# Patient Record
Sex: Female | Born: 1965 | Race: White | Hispanic: No | Marital: Married | Smoking: Never smoker
Health system: Southern US, Community
[De-identification: ages and names within clinical notes are randomized; demographics above are authoritative.]

## PROBLEM LIST (undated history)

## (undated) DIAGNOSIS — Z974 Presence of external hearing-aid: Secondary | ICD-10-CM

## (undated) DIAGNOSIS — I10 Essential (primary) hypertension: Secondary | ICD-10-CM

## (undated) DIAGNOSIS — R011 Cardiac murmur, unspecified: Secondary | ICD-10-CM

## (undated) DIAGNOSIS — Z8041 Family history of malignant neoplasm of ovary: Secondary | ICD-10-CM

## (undated) DIAGNOSIS — Z803 Family history of malignant neoplasm of breast: Secondary | ICD-10-CM

## (undated) DIAGNOSIS — M2040 Other hammer toe(s) (acquired), unspecified foot: Secondary | ICD-10-CM

## (undated) DIAGNOSIS — Z808 Family history of malignant neoplasm of other organs or systems: Secondary | ICD-10-CM

## (undated) DIAGNOSIS — Z801 Family history of malignant neoplasm of trachea, bronchus and lung: Secondary | ICD-10-CM

## (undated) DIAGNOSIS — Z8669 Personal history of other diseases of the nervous system and sense organs: Secondary | ICD-10-CM

## (undated) DIAGNOSIS — K589 Irritable bowel syndrome without diarrhea: Secondary | ICD-10-CM

## (undated) DIAGNOSIS — G4730 Sleep apnea, unspecified: Secondary | ICD-10-CM

## (undated) DIAGNOSIS — C801 Malignant (primary) neoplasm, unspecified: Secondary | ICD-10-CM

## (undated) DIAGNOSIS — L309 Dermatitis, unspecified: Secondary | ICD-10-CM

## (undated) DIAGNOSIS — Z923 Personal history of irradiation: Secondary | ICD-10-CM

## (undated) DIAGNOSIS — M2010 Hallux valgus (acquired), unspecified foot: Secondary | ICD-10-CM

## (undated) HISTORY — DX: Family history of malignant neoplasm of ovary: Z80.41

## (undated) HISTORY — DX: Family history of malignant neoplasm of other organs or systems: Z80.8

## (undated) HISTORY — PX: TONSILLECTOMY: SHX5217

## (undated) HISTORY — DX: Family history of malignant neoplasm of breast: Z80.3

## (undated) HISTORY — DX: Family history of malignant neoplasm of trachea, bronchus and lung: Z80.1

---

## 1997-05-09 ENCOUNTER — Inpatient Hospital Stay (HOSPITAL_COMMUNITY): Admission: AD | Admit: 1997-05-09 | Discharge: 1997-05-09 | Payer: Self-pay | Admitting: Obstetrics and Gynecology

## 1998-01-17 ENCOUNTER — Inpatient Hospital Stay (HOSPITAL_COMMUNITY): Admission: AD | Admit: 1998-01-17 | Discharge: 1998-01-19 | Payer: Self-pay | Admitting: Obstetrics and Gynecology

## 1998-01-19 ENCOUNTER — Encounter (HOSPITAL_COMMUNITY): Admission: RE | Admit: 1998-01-19 | Discharge: 1998-04-19 | Payer: Self-pay | Admitting: Obstetrics and Gynecology

## 1999-02-10 ENCOUNTER — Other Ambulatory Visit: Admission: RE | Admit: 1999-02-10 | Discharge: 1999-02-10 | Payer: Self-pay | Admitting: Obstetrics and Gynecology

## 2000-01-01 ENCOUNTER — Inpatient Hospital Stay (HOSPITAL_COMMUNITY): Admission: AD | Admit: 2000-01-01 | Discharge: 2000-01-04 | Payer: Self-pay | Admitting: Obstetrics and Gynecology

## 2000-01-01 ENCOUNTER — Encounter (INDEPENDENT_AMBULATORY_CARE_PROVIDER_SITE_OTHER): Payer: Self-pay

## 2000-01-15 ENCOUNTER — Encounter: Admission: RE | Admit: 2000-01-15 | Discharge: 2000-01-28 | Payer: Self-pay | Admitting: Obstetrics and Gynecology

## 2000-02-09 ENCOUNTER — Other Ambulatory Visit: Admission: RE | Admit: 2000-02-09 | Discharge: 2000-02-09 | Payer: Self-pay | Admitting: Obstetrics and Gynecology

## 2000-05-30 ENCOUNTER — Emergency Department (HOSPITAL_COMMUNITY): Admission: EM | Admit: 2000-05-30 | Discharge: 2000-05-30 | Payer: Self-pay | Admitting: Emergency Medicine

## 2000-06-25 ENCOUNTER — Encounter (INDEPENDENT_AMBULATORY_CARE_PROVIDER_SITE_OTHER): Payer: Self-pay | Admitting: *Deleted

## 2000-06-25 ENCOUNTER — Ambulatory Visit (HOSPITAL_BASED_OUTPATIENT_CLINIC_OR_DEPARTMENT_OTHER): Admission: RE | Admit: 2000-06-25 | Discharge: 2000-06-25 | Payer: Self-pay | Admitting: Otolaryngology

## 2000-06-25 HISTORY — PX: EXPLORATORY TYMPANOTOMY: SHX1545

## 2000-06-25 HISTORY — PX: TYMPANOPLASTY: SHX33

## 2001-02-08 ENCOUNTER — Other Ambulatory Visit: Admission: RE | Admit: 2001-02-08 | Discharge: 2001-02-08 | Payer: Self-pay | Admitting: Obstetrics and Gynecology

## 2002-03-16 ENCOUNTER — Other Ambulatory Visit: Admission: RE | Admit: 2002-03-16 | Discharge: 2002-03-16 | Payer: Self-pay | Admitting: Obstetrics and Gynecology

## 2003-03-16 ENCOUNTER — Other Ambulatory Visit: Admission: RE | Admit: 2003-03-16 | Discharge: 2003-03-16 | Payer: Self-pay | Admitting: Obstetrics and Gynecology

## 2004-04-16 ENCOUNTER — Other Ambulatory Visit: Admission: RE | Admit: 2004-04-16 | Discharge: 2004-04-16 | Payer: Self-pay | Admitting: Obstetrics and Gynecology

## 2004-09-17 ENCOUNTER — Other Ambulatory Visit: Admission: RE | Admit: 2004-09-17 | Discharge: 2004-09-17 | Payer: Self-pay | Admitting: Obstetrics and Gynecology

## 2006-01-15 ENCOUNTER — Emergency Department (HOSPITAL_COMMUNITY): Admission: EM | Admit: 2006-01-15 | Discharge: 2006-01-15 | Payer: Self-pay | Admitting: Emergency Medicine

## 2006-06-15 ENCOUNTER — Emergency Department (HOSPITAL_COMMUNITY): Admission: EM | Admit: 2006-06-15 | Discharge: 2006-06-15 | Payer: Self-pay | Admitting: Emergency Medicine

## 2007-06-22 ENCOUNTER — Encounter: Payer: Self-pay | Admitting: Family Medicine

## 2007-07-14 ENCOUNTER — Ambulatory Visit: Payer: Self-pay | Admitting: Family Medicine

## 2007-07-14 DIAGNOSIS — H6690 Otitis media, unspecified, unspecified ear: Secondary | ICD-10-CM | POA: Insufficient documentation

## 2007-07-14 DIAGNOSIS — E049 Nontoxic goiter, unspecified: Secondary | ICD-10-CM | POA: Insufficient documentation

## 2007-07-18 ENCOUNTER — Telehealth: Payer: Self-pay | Admitting: Family Medicine

## 2007-07-27 ENCOUNTER — Encounter: Payer: Self-pay | Admitting: Family Medicine

## 2007-08-04 LAB — CONVERTED CEMR LAB
AST: 17 units/L (ref 0–37)
Alkaline Phosphatase: 68 units/L (ref 39–117)
BUN: 16 mg/dL (ref 6–23)
Creatinine, Ser: 0.95 mg/dL (ref 0.40–1.20)
Glucose, Bld: 103 mg/dL — ABNORMAL HIGH (ref 70–99)
HDL: 50 mg/dL (ref >39)
LDL Cholesterol: 85 mg/dL (ref 0–99)
Total Bilirubin: 0.6 mg/dL (ref 0.3–1.2)
Total CHOL/HDL Ratio: 3.1
Triglycerides: 91 mg/dL (ref <150)
VLDL: 18 mg/dL (ref 0–40)

## 2007-08-05 ENCOUNTER — Telehealth: Payer: Self-pay | Admitting: Family Medicine

## 2008-09-03 ENCOUNTER — Ambulatory Visit: Payer: Self-pay | Admitting: Occupational Medicine

## 2008-09-03 DIAGNOSIS — I10 Essential (primary) hypertension: Secondary | ICD-10-CM | POA: Insufficient documentation

## 2008-09-19 ENCOUNTER — Ambulatory Visit: Payer: Self-pay | Admitting: Family Medicine

## 2008-09-21 ENCOUNTER — Encounter: Payer: Self-pay | Admitting: Family Medicine

## 2008-09-23 LAB — CONVERTED CEMR LAB
AST: 22 units/L (ref 0–37)
Alkaline Phosphatase: 71 units/L (ref 39–117)
BUN: 17 mg/dL (ref 6–23)
Calcium: 8.6 mg/dL (ref 8.4–10.5)
Chloride: 106 meq/L (ref 96–112)
Creatinine, Ser: 0.98 mg/dL (ref 0.40–1.20)
HDL: 54 mg/dL (ref >39)
Total Bilirubin: 0.5 mg/dL (ref 0.3–1.2)
Total CHOL/HDL Ratio: 3.1
VLDL: 13 mg/dL (ref 0–40)

## 2009-07-31 ENCOUNTER — Encounter: Payer: Self-pay | Admitting: Family Medicine

## 2009-10-10 ENCOUNTER — Ambulatory Visit (HOSPITAL_BASED_OUTPATIENT_CLINIC_OR_DEPARTMENT_OTHER): Admission: RE | Admit: 2009-10-10 | Discharge: 2009-10-10 | Payer: Self-pay | Admitting: Otolaryngology

## 2009-10-10 HISTORY — PX: TYMPANOSTOMY TUBE PLACEMENT: SHX32

## 2009-12-09 ENCOUNTER — Ambulatory Visit: Payer: Self-pay | Admitting: Emergency Medicine

## 2009-12-09 DIAGNOSIS — H6980 Other specified disorders of Eustachian tube, unspecified ear: Secondary | ICD-10-CM

## 2010-04-08 NOTE — Consult Note (Signed)
Summary: [facility]  [facility]   Imported By: Lanelle Bal 08/12/2009 09:08:34  _____________________________________________________________________  External Attachment:    Type:   Image     Comment:   External Document

## 2010-04-08 NOTE — Assessment & Plan Note (Signed)
Summary: RUNNY NOSE,SINUS PROBLEMS,COUGH,CONGESTION/TJ   Vital Signs:  Patient Profile:   45 Years Old Female CC:      congestion, cough, runny nose Height:     61 inches Weight:      133 pounds O2 Sat:      99 % O2 treatment:    Room Air Temp:     97.8 degrees F oral Pulse rate:   99 / minute Resp:     16 per minute BP sitting:   125 / 80  (left arm) Cuff size:   regular  Vitals Entered By: Lajean Saver RN (December 09, 2009 6:17 PM)                  Updated Prior Medication List: LISINOPRIL-HYDROCHLOROTHIAZIDE 20-12.5 MG TABS (LISINOPRIL-HYDROCHLOROTHIAZIDE) Take 1 tablet by mouth once a day FLUTICASONE PROPIONATE 50 MCG/ACT  SUSP (FLUTICASONE PROPIONATE) 2 sprays in each nostril daily JUNEL FE 1/20 1-20 MG-MCG TABS (NORETHIN ACE-ETH ESTRAD-FE) once daily  Current Allergies (reviewed today): ! ERYTHROMYCIN ! MACROBIDHistory of Present Illness Chief Complaint: congestion, cough, runny nose History of Present Illness: Patient complains of onset of cold symptoms for 14+ days.  They have been using OTC Afrin and cold & cold meds which is helping a little bit.  Everybody in the house was sick but getting better. 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 No sore throat + cough No pleuritic pain No wheezing + nasal congestion + post-nasal drainage + sinus pain/pressure No itchy/red eyes No earache No hemoptysis No SOB No chills/sweats No fever No nausea No vomiting No abdominal pain No diarrhea No skin rashes No fatigue No myalgias No headache   REVIEW OF SYSTEMS Constitutional Symptoms       Complains of fatigue.     Denies fever, chills, night sweats, weight loss, and weight gain.  Eyes       Complains of contact lenses.      Denies change in vision, eye pain, eye discharge, glasses, and eye surgery. Ear/Nose/Throat/Mouth       Complains of ear pain, frequent runny nose, and sinus problems.      Denies hearing loss/aids, change in hearing, ear discharge, dizziness,  frequent nose bleeds, sore throat, hoarseness, and tooth pain or bleeding.  Respiratory       Complains of productive cough.      Denies dry cough, wheezing, shortness of breath, asthma, bronchitis, and emphysema/COPD.  Cardiovascular       Denies murmurs, chest pain, and tires easily with exhertion.    Gastrointestinal       Denies stomach pain, nausea/vomiting, diarrhea, constipation, blood in bowel movements, and indigestion. Genitourniary       Denies painful urination, kidney stones, and loss of urinary control. Neurological       Denies paralysis, seizures, and fainting/blackouts. Musculoskeletal       Denies muscle pain, joint pain, joint stiffness, decreased range of motion, redness, swelling, muscle weakness, and gout.  Skin       Denies bruising, unusual mles/lumps or sores, and hair/skin or nail changes.  Psych       Denies mood changes, temper/anger issues, anxiety/stress, speech problems, depression, and sleep problems.  Past History:  Past Medical History: Reviewed history from 09/03/2008 and no changes required. Hypertension  Past Surgical History: Skin graft over left TM 2002 Surgical removal of wisdom teet in 1991 surgery on left ear 2002 tubes in ears 1995 adenoids removed 1995 Ear tubes 10/2009  Family History: Reviewed history  from 09/03/2008 and no changes required. Ovarian cancer Paternal aunt Father with MI in 63 Brother with Cholesterol Father, Mother with HTN mother - alzheimers  Social History: Reviewed history from 07/14/2007 and no changes required. 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  2 yrs of college.  Married to [employer] with 2 kids.   Never Smoked Alcohol use-yes Drug use-no Regular exercise-no Physical Exam General appearance: well developed, well nourished, no acute distress Ears: bilateral tubes present, L TM slight erythema Nasal: mucosa pink, nonedematous, no septal deviation, turbinates normal Oral/Pharynx: slight erythema, slight  tonsil enlargement, no exudates, dental braces present Chest/Lungs: no rales, wheezes, or rhonchi bilateral, breath sounds equal without effort Heart: regular rate and  rhythm, no murmur Skin: no obvious rashes or lesions MSE: oriented to time, place, and person Assessment New Problems: EUSTACHIAN TUBE DYSFUNCTION, 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  Plan New Medications/Changes: AMOXICILLIN 875 MG TABS (AMOXICILLIN) 1 tab by mouth two times a day for 10 days  #20 x 0, 12/09/2009, Hoyt Koch MD  New Orders: Est. Patient Level II [phone] Planning Comments:   Sudafed Hand hygeine and need to disinfect the house (lysol handles, clean bathrooms, change sheets) to prevent further spread Follow-up with your primary care physician if not improving or if getting worse   The patient and/or caregiver has been counseled thoroughly with regard to medications prescribed including dosage, schedule, interactions, rationale for use, and possible side effects and they verbalize understanding.  Diagnoses and expected course of recovery discussed and will return if not improved as expected or if the condition worsens. Patient and/or caregiver verbalized understanding.  Prescriptions: AMOXICILLIN 875 MG TABS (AMOXICILLIN) 1 tab by mouth two times a day for 10 days  #20 x 0   Entered and Authorized by:   Hoyt Koch MD   Signed by:   Hoyt Koch MD on 12/09/2009   Method used:   Print then Give to Patient   RxID:   [id number]   Orders Added: 1)  Est. Patient Level II [44034]

## 2010-05-23 LAB — BASIC METABOLIC PANEL
CO2: 26 mEq/L (ref 19–32)
Calcium: 9.1 mg/dL (ref 8.4–10.5)
Creatinine, Ser: 0.94 mg/dL (ref 0.4–1.2)
GFR calc Af Amer: >60 mL/min (ref >60)
Glucose, Bld: 100 mg/dL — ABNORMAL HIGH (ref 70–99)

## 2010-06-26 ENCOUNTER — Other Ambulatory Visit: Payer: Self-pay | Admitting: Family Medicine

## 2010-07-25 NOTE — H&P (Signed)
[place]. [facility]  Patient:    [name], [name]                     MRN: [id number] Adm. Date:  [id number] Attending:  Waldon Merl CC:         Meredith Staggers, M.D.   History and Physical  HISTORY OF PRESENT ILLNESS:  This patient is a 45 year old female, who has had a long history of ear infections going back to her childhood and teenage years.  She never had PE tubes as a child, but approximately five years ago Dr. Silvio Pate in [place] did PE tubes and she had had fluid behind both tympanic membrane, and she gained some improvement; however, the left ear has shown a conductive and sensorineural hearing loss that has been increasing in nature.  The right ear also has a mild conductive and sensorineural hearing loss.  The left ear is the more severe problem.  The sensorineural status is approximately a 25 and 30 decibel.  The conductive status takes the left one to a 45 decibel loss with a 96% word discrimination, the right to a 35 decibel loss.  There is a negative impedance study, and the tympanic membranes on both sides are retracted; however, on the left side it is not only retracted but is adherent, and it appears that this crusting is covering over one portion of the tympanic membrane that even permits a reasonable seal.  She now enters for an exploratory tympanotomy and a tympanic and ossicular chain mobilization to try to correct some of the adhesions that she has and compartmentalization that is occurring in the middle ear and to correct the tympanic membrane perforation.  She has been subject to multiple infections where she has been on Ceftin and some Flagyl to protect her.  She has been on Duratuss GP back in 2001, continues with Augmentin and continues with decongestants with very little effect.  She has also been on Avelox and Floxin otic suspension for otitis externa.  The antibiotics and decongestants did little for her  hearing. It did correct the infections, but it did not resolve the hearing problems. The left ear would be the ear to approach, and we are therefore planning to do an exploratory tympanotomy, lysis of adhesions, mobilization of the ossicular chain, and a tympanoplasty.  PAST MEDICAL HISTORY:   She wears contacts.  She has eczema of the face, and that is her only medical problem.  MEDICATIONS:  She takes no medications except the above-mentioned.  ALLERGIES:  ERYTHROMYCIN and MACROBID.  SOCIAL HISTORY:  She does not smoke or drink.  PAST SURGICAL HISTORY:  She had surgery on her ears five years ago and then has had surgery at [facility] in November 1999 and October 2001.  PHYSICAL EXAMINATION:  HEENT:  Her physical examination reveals a left tympanic membrane perforation with a crusting over this perforation, with adhesions and severe retraction into the facial recess and into the hypotympanum of the tympanic membrane. The ossicular chain shows little mobility on pnemo-otoscopy.  The right ear is in better condition.  There is some retraction, but there is more mobility, and the tympanic membrane is not so severely retracted.  The nose is clear. The oral cavity is clear, no ulcerations or mass.  No inflammation.  The lips, teeth, and gums are normal.  Her larynx is free of any ulceration or mass.  NECK:  Free of any thyromegaly, cervical adenopathy, or mass.  No salivary gland abnormalities.  CHEST:  Her chest is clear.  No rales, rhonchi, or wheezes.  CARDIAC:  Normal S1, S2, no murmurs, rubs, or gallops.  ABDOMEN:  Unremarkable.  EXTREMITIES:  Unremarkable.  INITIAL DIAGNOSES: 1. 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 2. History of pressure equalization (PE) tubes. 3. History of bilateral otitis media. 4. History of allergies to  erythromycin and Macrobid. DD:  06/25/00 TD:  06/26/00 Job: [id number]

## 2010-07-25 NOTE — Op Note (Signed)
[place]. [facility]  Patient:    [name]                     MRN: [id number] Proc. Date: 06/25/00 Adm. Date:  [id number] Attending:  Waldon Merl CC:         Meredith Staggers, M.D.   Operative Report  PREOPERATIVE DIAGNOSIS:  Left tympanic membrane retraction with adhesions of the ossicular chain, with crusting and retraction into the facial recess and tympanic membrane perforation.  POSTOPERATIVE DIAGNOSIS:  Left tympanic membrane retraction with adhesions of the ossicular chain, with crusting and retraction into the facial recess and tympanic membrane perforation.  PROCEDURES:  A left exploratory tympanotomy, lysis of adhesions, and a tympanoplasty.  SURGEON:  Keturah Barre, M.D.  ANESTHESIA:  General endotracheal with local supplement.  CLINICAL NOTE:  The patient has been informed of risks and gains.  She has had several infections in the past, has had tubes done approximately five years ago.  She has severe retraction of her tympanic membranes.  Because of the multiple ear infections that she had in childhood, she has a bilateral sensorineural hearing loss of approximately 25 decibels.  She is also aware of the risk to her facial nerve and to her ossicular chain.  DESCRIPTION OF PROCEDURE:  Patient placed in supine position under general endotracheal anesthesia, was prepped and draped in the appropriate manner using Betadine, and the usual ear drape and head drape were used.  The ear was immediately cleansed of some of the cholesteatomatous debris that was in the pars tensa region, retracted somewhat inferiorly and posteriorly into the facial recess area.  Once this area was cleansed, however, the tympanic membrane was not so retracted as to be lost behind this region, and we were able to see it more clearly.  The pars flaccida region looked to be in quite decent condition, but it was slightly inflamed and thickened.   Xylocaine 1% with epinephrine had to be injected in the four quadrants of the external ear canal to minimize an bleeding or swelling.  At this point, a tympanotomy incision was carried out, and as we reflected the external ear canal down, we could see very clearly that the hypotympanum region of the tympanic membrane was plastered against the promontory, causing this adhesion, and the perforation was essentially in that region.  We reflected the tympanic membrane down to the annulus and then the chorda tympani.  Scar tissue and the tympanic membrane were separated from the chorda tympani, and the chorda tympani was carefully saved.  The incus was also fixed with fibrous adhesions, and these were carefully lysed also.  The stapes was associated with adhesions, and these were lysed anteriorly and posteriorly.  The hypotympanum, we began to open this up, and we could reflect the thin tympanic membrane back from the facial recess region and along the hypotympanum region also.  We came to the promontory, where the tympanic membrane was adherent and this was separated, and immediately perforation was part of this problem.  This was all separated from the hypotympanum and promontory region.  The edge of the perforation was trimmed.  It was so thin it would not support a graft, and this was prepared to receive a graft.  The rest of the superior aspect of the middle ear was cleaned of adhesions which were beneath the malleus and beneath the incus and around the stapes, and the chorda tympani was freed, which was, after all  was said and done, we were in quite excellent condition with this. The mobility of the ossicular chain was also found to be quite excellent.  No cholesteatoma was found in the attic region or in the areas that we could not see well.  A temporalis fascia graft was then taken.  Hemostasis was established with Bovie electrocoagulation, and closure was with 5-0 Ethilon. This graft was  prepared and then middle ear, Gelfoam with Cortisporin was placed within the middle ear.  The graft was placed beneath the edges of the tympanic membrane perforation and draped over the Gelfoam.  Once this was placed and Gelfoam was placed lateral to this graft, the remainder of the tympanic membrane and canal skin was placed back in its original position, and the Gelfoam was continued packing this in place.  More laterally, Neosporin ointment was placed and then cotton in the external ear canal, a Steri-Strip on the donor site for the graft.  The patient was awakened, tolerated the procedure well, and was doing well postop.  Follow-up is going to be in one week, three weeks, six weeks, three months, six months, and a year. DD:  06/25/00 TD:  06/26/00 Job: [id number]

## 2010-10-09 ENCOUNTER — Other Ambulatory Visit: Payer: Self-pay | Admitting: Family Medicine

## 2010-10-10 ENCOUNTER — Other Ambulatory Visit: Payer: Self-pay | Admitting: Family Medicine

## 2010-10-10 MED ORDER — LISINOPRIL-HYDROCHLOROTHIAZIDE 20-12.5 MG PO TABS: 1.0000 | ORAL_TABLET | Freq: Every day | ORAL | Status: DC

## 2010-10-10 NOTE — Telephone Encounter (Signed)
Pt called and said she had requested from her pharm for a refill of her lisinopril HCTZ 20-12.5, and our office had denied.  Pt knows she needs an appt.  Last office visit for HTN was 09-19-08.   Plan:  Scheduled pt an appt, and told will call med in for 30 day supply only and she was told she has to keep her appt, or no more med will be called. Jarvis Newcomer, LPN [facility]

## 2010-10-13 ENCOUNTER — Encounter: Payer: Self-pay | Admitting: Family Medicine

## 2010-10-13 ENCOUNTER — Ambulatory Visit (INDEPENDENT_AMBULATORY_CARE_PROVIDER_SITE_OTHER): Payer: 59 | Admitting: Family Medicine

## 2010-10-13 VITALS — BP 130/81 | HR 100 | Ht 61.0 in | Wt 138.0 lb

## 2010-10-13 DIAGNOSIS — H6980 Other specified disorders of Eustachian tube, unspecified ear: Secondary | ICD-10-CM

## 2010-10-13 DIAGNOSIS — I10 Essential (primary) hypertension: Secondary | ICD-10-CM

## 2010-10-13 MED ORDER — LISINOPRIL-HYDROCHLOROTHIAZIDE 20-12.5 MG PO TABS: 1.0000 | ORAL_TABLET | Freq: Every day | ORAL | Status: DC

## 2010-10-13 MED ORDER — MUPIROCIN 2 % EX OINT: TOPICAL_OINTMENT | CUTANEOUS | Status: DC

## 2010-10-13 NOTE — Progress Notes (Signed)
  Subjective:    Patient ID: [name], female    DOB: 03-Aug-1965, 45 y.o.   MRN: [id number]  Hypertension This is a chronic problem. The current episode started more than 1 year ago. The problem has been resolved since onset. The problem is controlled. Pertinent negatives include no chest pain or shortness of breath. There are no associated agents to hypertension. Risk factors for coronary artery disease include no known risk factors. Past treatments include ACE inhibitors and diuretics. The current treatment provides mild improvement. There are no compliance problems.    Rash on the right side of her face for about 8 weeks. No chaing in make-up etc.  Has been a little tender. No itching. Hx of eczema.  Was dry and flaky initially. Did try hydrocortisone and a little better but not completely gone. Never had before.   Review of Systems  Respiratory: Negative for shortness of breath.   Cardiovascular: Negative for chest pain.       Objective:   Physical Exam  Constitutional: She is oriented to person, place, and time. She appears well-developed and well-nourished.  HENT:  Head: Normocephalic and atraumatic.  Cardiovascular: Normal rate, regular rhythm and normal heart sounds.   Pulmonary/Chest: Effort normal and breath sounds normal.  Neurological: She is alert and oriented to person, place, and time.  Skin: Skin is warm and dry.       Erythematous papules rash on the side of the nose, under her right eye and down to her chin. A few of the lesion are weeping but no actual vesicles.   Psychiatric: She has a normal mood and affect. Her behavior is normal.          Assessment & Plan:  Rash - suspect secondarily infected. Will tx with bactroban and if not better in 2 weeks then let me know and can try a topical steroid cream.  It doesn't appear fungal.

## 2010-10-13 NOTE — Assessment & Plan Note (Signed)
BP at goal today. F/u in 6 months. Due for labs.

## 2010-10-14 LAB — LIPID PANEL
Cholesterol: 162 mg/dL (ref 0–200)
Total CHOL/HDL Ratio: 3.2 Ratio
Triglycerides: 70 mg/dL (ref <150)
VLDL: 14 mg/dL (ref 0–40)

## 2010-10-15 ENCOUNTER — Telehealth: Payer: Self-pay | Admitting: Family Medicine

## 2010-10-15 LAB — COMPLETE METABOLIC PANEL WITH GFR
AST: 21 U/L (ref 0–37)
Alkaline Phosphatase: 79 U/L (ref 39–117)
BUN: 21 mg/dL (ref 6–23)
Creat: 1.06 mg/dL (ref 0.50–1.10)
Potassium: 4.6 mEq/L (ref 3.5–5.3)

## 2010-10-15 NOTE — Telephone Encounter (Signed)
Pt.notified

## 2010-10-15 NOTE — Telephone Encounter (Signed)
Call pt: CMP and lipids look good. Kidney func is up a little, may just be a little dry.  Make sure drinking plenty of fluids.

## 2011-05-13 ENCOUNTER — Emergency Department
Admission: EM | Admit: 2011-05-13 | Discharge: 2011-05-13 | Disposition: A | Discharge status: Home Health | Payer: 59 | Source: Home / Self Care | Attending: Family Medicine | Admitting: Family Medicine

## 2011-05-13 DIAGNOSIS — J209 Acute bronchitis, unspecified: Secondary | ICD-10-CM

## 2011-05-13 HISTORY — DX: Essential (primary) hypertension: I10

## 2011-05-13 MED ORDER — AZITHROMYCIN 250 MG PO TABS: ORAL_TABLET | ORAL | Status: AC

## 2011-05-13 MED ORDER — BENZONATATE 200 MG PO CAPS: 200.0000 mg | ORAL_CAPSULE | Freq: Every day | ORAL | Status: AC

## 2011-05-13 NOTE — ED Provider Notes (Signed)
History     CSN: [id number]  Arrival date & time 05/13/11  1818   First MD Initiated Contact with Patient 05/13/11 1845      Chief Complaint  Patient presents with  . Cough     HPI Comments: Patient complains of approximately 1.5 week history of gradually progressive URI symptoms beginning with a mild sore throat (now improved) and cough, followed by progressive nasal congestion.  Complains of fatigue and initial myalgias.  Cough is now worse at night and generally non-productive during the day.  There has been no pleuritic pain, shortness of breath, or wheezes.  Her tetanus immunization is current.  The history is provided by the patient.    Past Medical History  Diagnosis Date  . Hypertension   . Hearing loss     Past Surgical History  Procedure Date  . Tympanostomy 10/2009    Dr. Suzanna Obey.     Family History  Problem Relation Age of Onset  . Alzheimer's disease Mother     History  Substance Use Topics  . Smoking status: Never Smoker   . Smokeless tobacco: Not on file  . Alcohol Use: Yes    OB History    Grav Para Term Preterm Abortions TAB SAB Ect Mult Living                  Review of Systems + sore throat, resolved + cough No pleuritic pain No wheezing + nasal congestion + post-nasal drainage No sinus pain/pressure No itchy/red eyes No earache No hemoptysis No SOB No fever/ chills No nausea No vomiting No abdominal pain No diarrhea No urinary symptoms No skin rashes + fatigue No myalgias No headache Used OTC meds without relief  Allergies  Erythromycin and Nitrofurantoin  Home Medications   Current Outpatient Rx  Name Route Sig Dispense Refill  . NORETHIN ACE-ETH ESTRAD-FE 1-20 MG-MCG PO TABS Oral Take 1 tablet by mouth daily.    . AZITHROMYCIN 250 MG PO TABS  Take 2 tabs today; then begin one tab once daily for 4 more days. 6 each 0  . BENZONATATE 200 MG PO CAPS Oral Take 1 capsule (200 mg total) by mouth at bedtime. Take as needed  for cough 12 capsule 0  . LISINOPRIL-HYDROCHLOROTHIAZIDE 20-12.5 MG PO TABS Oral Take 1 tablet by mouth daily. 90 tablet 1  . MUPIROCIN 2 % EX OINT  Apply to affected area 3 times daily 22 g 0    BP 124/73  Pulse 99  Temp(Src) 98.4 F (36.9 C) (Oral)  Resp 16  Ht 5\' 1"  (1.549 m)  Wt 143 lb (64.864 kg)  BMI 27.02 kg/m2  SpO2 98%  Physical Exam Nursing notes and Vital Signs reviewed. Appearance:  Patient appears healthy, stated age, and in no acute distress Eyes:  Pupils are equal, round, and reactive to light and accomodation.  Extraocular movement is intact.  Conjunctivae are not inflamed  Ears:  Canals normal.  Tympanic membranes are scarred and bilateral T-tubes in place Nose:  Mildly congested turbinates.  No sinus tenderness.   Pharynx:  Normal Neck:  Supple.  Slightly tender shotty posterior nodes are palpated bilaterally  Lungs:  Clear to auscultation.  Breath sounds are equal.  Heart:  Regular rate and rhythm without murmurs, rubs, or gallops.  Abdomen:  Nontender without masses or hepatosplenomegaly.  Bowel sounds are present.  No CVA or flank tenderness.  Extremities:  No edema.  No calf tenderness Skin:  No rash present.  ED Course  Procedures  none  Labs Reviewed - No data to display No results found.   1. Acute bronchitis       MDM   Begin Z-pack, and Tessalon at bedtime. Take plain Mucinex (guaifenesin) twice daily for cough and congestion.  Increase fluid intake, rest. May use Afrin nasal spray (or generic oxymetazoline) twice daily for about 5 days.  Also recommend using saline nasal spray several times daily and saline nasal irrigation (AYR is a common brand) Stop all antihistamines for now, and other non-prescription cough/cold preparations. Follow-up with family doctor if not improving 7 to 10 days.        Donna Christen, MD 05/13/11 [phone]

## 2011-05-13 NOTE — Discharge Instructions (Signed)
Take plain Mucinex (guaifenesin) twice daily for cough and congestion.  Increase fluid intake, rest. May use Afrin nasal spray (or generic oxymetazoline) twice daily for about 5 days.  Also recommend using saline nasal spray several times daily and saline nasal irrigation (AYR is a common brand) Stop all antihistamines for now, and other non-prescription cough/cold preparations.   Follow-up with family doctor if not improving 7 to 10 days.  

## 2011-05-13 NOTE — ED Notes (Signed)
Pt c/o cough, nasal congestion and hoarseness for past week.  Pt states she was initially feverish as well but no longer.

## 2011-05-23 ENCOUNTER — Other Ambulatory Visit: Payer: Self-pay | Admitting: Family Medicine

## 2011-05-28 ENCOUNTER — Ambulatory Visit (INDEPENDENT_AMBULATORY_CARE_PROVIDER_SITE_OTHER): Payer: 59 | Admitting: Family Medicine

## 2011-05-28 ENCOUNTER — Encounter: Payer: Self-pay | Admitting: Family Medicine

## 2011-05-28 VITALS — BP 131/74 | HR 80 | Ht 61.0 in | Wt 141.0 lb

## 2011-05-28 DIAGNOSIS — H9190 Unspecified hearing loss, unspecified ear: Secondary | ICD-10-CM | POA: Insufficient documentation

## 2011-05-28 DIAGNOSIS — I10 Essential (primary) hypertension: Secondary | ICD-10-CM

## 2011-05-28 NOTE — Progress Notes (Signed)
  Subjective:    Patient ID: [name], female    DOB: 11/16/1965, 46 y.o.   MRN: [id number]  Hypertension This is a chronic problem. The current episode started more than 1 year ago. The problem is unchanged. The problem is controlled. Pertinent negatives include no blurred vision, chest pain or shortness of breath. There are no associated agents to hypertension. Past treatments include ACE inhibitors and diuretics. There are no compliance problems.    Working out 2 days per week.    Review of Systems  Eyes: Negative for blurred vision.  Respiratory: Negative for shortness of breath.   Cardiovascular: Negative for chest pain.       Objective:   Physical Exam  Constitutional: She is oriented to person, place, and time. She appears well-developed and well-nourished.  HENT:  Head: Normocephalic and atraumatic.  Cardiovascular: Normal rate, regular rhythm and normal heart sounds.        No carotid bruits.   Pulmonary/Chest: Effort normal and breath sounds normal.  Musculoskeletal: She exhibits no edema.  Neurological: She is alert and oriented to person, place, and time.  Skin: Skin is warm and dry.  Psychiatric: She has a normal mood and affect. Her behavior is normal.          Assessment & Plan:  HTN - Well controlled. F/YU in 6 month.  Check BMP today. She is working on Programmer, applications and has lost a couple of pounds so far. Keep up the good work.  Working on low salt diet too!Marland Kitchen

## 2011-05-29 LAB — BASIC METABOLIC PANEL
CO2: 21 mEq/L (ref 19–32)
Calcium: 9 mg/dL (ref 8.4–10.5)
Creat: 1.05 mg/dL (ref 0.50–1.10)
Glucose, Bld: 89 mg/dL (ref 70–99)

## 2011-05-29 NOTE — Progress Notes (Signed)
Quick Note:  All labs are normal. ______ 

## 2011-12-16 ENCOUNTER — Other Ambulatory Visit: Payer: Self-pay | Admitting: Family Medicine

## 2012-04-18 ENCOUNTER — Other Ambulatory Visit: Payer: Self-pay | Admitting: Family Medicine

## 2012-05-02 ENCOUNTER — Other Ambulatory Visit: Payer: Self-pay | Admitting: Oncology

## 2012-05-21 ENCOUNTER — Other Ambulatory Visit: Payer: Self-pay | Admitting: Family Medicine

## 2012-05-23 ENCOUNTER — Other Ambulatory Visit: Payer: Self-pay | Admitting: *Deleted

## 2012-05-23 MED ORDER — LISINOPRIL-HYDROCHLOROTHIAZIDE 20-12.5 MG PO TABS: 1.0000 | ORAL_TABLET | Freq: Every day | ORAL | Status: DC

## 2012-05-26 ENCOUNTER — Ambulatory Visit (INDEPENDENT_AMBULATORY_CARE_PROVIDER_SITE_OTHER): Payer: 59 | Admitting: Family Medicine

## 2012-05-26 ENCOUNTER — Encounter: Payer: Self-pay | Admitting: Family Medicine

## 2012-05-26 VITALS — BP 104/64 | HR 107 | Ht 61.0 in | Wt 139.0 lb

## 2012-05-26 DIAGNOSIS — I10 Essential (primary) hypertension: Secondary | ICD-10-CM

## 2012-05-26 LAB — COMPLETE METABOLIC PANEL WITH GFR
ALT: 16 U/L (ref 0–35)
AST: 20 U/L (ref 0–37)
Alkaline Phosphatase: 65 U/L (ref 39–117)
BUN: 18 mg/dL (ref 6–23)
Calcium: 8.9 mg/dL (ref 8.4–10.5)
Chloride: 107 mEq/L (ref 96–112)
Creat: 1.06 mg/dL (ref 0.50–1.10)

## 2012-05-26 LAB — LIPID PANEL
Cholesterol: 134 mg/dL (ref 0–200)
HDL: 43 mg/dL (ref >39)
LDL Cholesterol: 69 mg/dL (ref 0–99)
Total CHOL/HDL Ratio: 3.1 Ratio
Triglycerides: 111 mg/dL (ref <150)
VLDL: 22 mg/dL (ref 0–40)

## 2012-05-26 MED ORDER — LISINOPRIL-HYDROCHLOROTHIAZIDE 20-12.5 MG PO TABS: 1.0000 | ORAL_TABLET | Freq: Every day | ORAL | Status: DC

## 2012-05-26 NOTE — Progress Notes (Signed)
  Subjective:    Patient ID: [name], female    DOB: 12/14/1965, 47 y.o.   MRN: [id number]  HPI HTN-  Pt denies chest pain, SOB, dizziness, or heart palpitations.  Taking meds as directed w/o problems.  Denies medication side effects.      Review of Systems     Objective:   Physical Exam  Constitutional: She is oriented to person, place, and time. She appears well-developed and well-nourished.  HENT:  Head: Normocephalic and atraumatic.  Cardiovascular: Normal rate, regular rhythm and normal heart sounds.   No carotid bruits.   Pulmonary/Chest: Effort normal and breath sounds normal.  Musculoskeletal: She exhibits no edema.  Neurological: She is alert and oriented to person, place, and time.  Skin: Skin is warm and dry.  Psychiatric: She has a normal mood and affect. Her behavior is normal.          Assessment & Plan:  HTN - Well controlled. Continue current regimen. F/u in 6 onths. Due for CMP nad fasting lipids

## 2012-06-07 DIAGNOSIS — M2040 Other hammer toe(s) (acquired), unspecified foot: Secondary | ICD-10-CM

## 2012-06-07 DIAGNOSIS — M2010 Hallux valgus (acquired), unspecified foot: Secondary | ICD-10-CM

## 2012-06-07 HISTORY — DX: Hallux valgus (acquired), unspecified foot: M20.10

## 2012-06-07 HISTORY — DX: Other hammer toe(s) (acquired), unspecified foot: M20.40

## 2012-06-09 ENCOUNTER — Encounter (HOSPITAL_BASED_OUTPATIENT_CLINIC_OR_DEPARTMENT_OTHER): Payer: Self-pay | Admitting: *Deleted

## 2012-06-09 NOTE — Pre-Procedure Instructions (Signed)
To come for EKG; CMET done 05/26/2012

## 2012-06-13 ENCOUNTER — Other Ambulatory Visit: Payer: Self-pay

## 2012-06-13 ENCOUNTER — Encounter (HOSPITAL_BASED_OUTPATIENT_CLINIC_OR_DEPARTMENT_OTHER)
Admission: RE | Admit: 2012-06-13 | Discharge: 2012-06-13 | Disposition: A | Discharge status: Home / Self Care | Payer: 59 | Source: Ambulatory Visit | Attending: Orthopedic Surgery | Admitting: Orthopedic Surgery

## 2012-06-15 ENCOUNTER — Other Ambulatory Visit: Payer: Self-pay | Admitting: Orthopedic Surgery

## 2012-06-16 ENCOUNTER — Encounter (HOSPITAL_BASED_OUTPATIENT_CLINIC_OR_DEPARTMENT_OTHER)
Admission: RE | Disposition: A | Discharge status: Home / Self Care | Payer: Self-pay | Source: Ambulatory Visit | Attending: Orthopedic Surgery

## 2012-06-16 ENCOUNTER — Encounter (HOSPITAL_BASED_OUTPATIENT_CLINIC_OR_DEPARTMENT_OTHER): Payer: Self-pay | Admitting: *Deleted

## 2012-06-16 ENCOUNTER — Ambulatory Visit (HOSPITAL_BASED_OUTPATIENT_CLINIC_OR_DEPARTMENT_OTHER): Payer: 59 | Admitting: Anesthesiology

## 2012-06-16 ENCOUNTER — Ambulatory Visit (HOSPITAL_BASED_OUTPATIENT_CLINIC_OR_DEPARTMENT_OTHER)
Admission: RE | Admit: 2012-06-16 | Discharge: 2012-06-16 | Disposition: A | Discharge status: Home / Self Care | Payer: 59 | Source: Ambulatory Visit | Attending: Orthopedic Surgery | Admitting: Orthopedic Surgery

## 2012-06-16 ENCOUNTER — Encounter (HOSPITAL_BASED_OUTPATIENT_CLINIC_OR_DEPARTMENT_OTHER): Payer: Self-pay | Admitting: Anesthesiology

## 2012-06-16 DIAGNOSIS — I10 Essential (primary) hypertension: Secondary | ICD-10-CM | POA: Insufficient documentation

## 2012-06-16 DIAGNOSIS — M21612 Bunion of left foot: Secondary | ICD-10-CM

## 2012-06-16 DIAGNOSIS — M2010 Hallux valgus (acquired), unspecified foot: Secondary | ICD-10-CM | POA: Insufficient documentation

## 2012-06-16 DIAGNOSIS — M21619 Bunion of unspecified foot: Secondary | ICD-10-CM | POA: Insufficient documentation

## 2012-06-16 DIAGNOSIS — M2040 Other hammer toe(s) (acquired), unspecified foot: Secondary | ICD-10-CM | POA: Insufficient documentation

## 2012-06-16 HISTORY — DX: Irritable bowel syndrome, unspecified: K58.9

## 2012-06-16 HISTORY — PX: HAMMER TOE SURGERY: SHX385

## 2012-06-16 HISTORY — DX: Dermatitis, unspecified: L30.9

## 2012-06-16 HISTORY — DX: Hallux valgus (acquired), unspecified foot: M20.10

## 2012-06-16 HISTORY — DX: Presence of external hearing-aid: Z97.4

## 2012-06-16 HISTORY — DX: Cardiac murmur, unspecified: R01.1

## 2012-06-16 HISTORY — DX: Other hammer toe(s) (acquired), unspecified foot: M20.40

## 2012-06-16 SURGERY — CORRECTION, HAMMER TOE
Anesthesia: General | Site: Foot | Laterality: Left | Wound class: Clean

## 2012-06-16 MED ORDER — ONDANSETRON HCL 4 MG/2ML IJ SOLN: 4.0000 mg | Freq: Once | INTRAMUSCULAR | Status: DC | PRN

## 2012-06-16 MED ORDER — MIDAZOLAM HCL 2 MG/2ML IJ SOLN
1.0000 mg | INTRAMUSCULAR | Status: DC | PRN
  Administered 2012-06-16: 2 mg via INTRAVENOUS

## 2012-06-16 MED ORDER — LIDOCAINE HCL (CARDIAC) 20 MG/ML IV SOLN
INTRAVENOUS | Status: DC | PRN
  Administered 2012-06-16: 30 mg via INTRAVENOUS

## 2012-06-16 MED ORDER — FENTANYL CITRATE 0.05 MG/ML IJ SOLN
50.0000 ug | INTRAMUSCULAR | Status: DC | PRN
  Administered 2012-06-16: 100 ug via INTRAVENOUS

## 2012-06-16 MED ORDER — BACITRACIN ZINC 500 UNIT/GM EX OINT
TOPICAL_OINTMENT | CUTANEOUS | Status: DC | PRN
  Administered 2012-06-16: 1 via TOPICAL

## 2012-06-16 MED ORDER — CEFAZOLIN SODIUM-DEXTROSE 2-3 GM-% IV SOLR
2.0000 g | INTRAVENOUS | Status: AC
  Administered 2012-06-16: 2 g via INTRAVENOUS

## 2012-06-16 MED ORDER — CHLORHEXIDINE GLUCONATE 4 % EX LIQD: 60.0000 mL | Freq: Once | CUTANEOUS | Status: DC

## 2012-06-16 MED ORDER — SODIUM CHLORIDE 0.9 % IV SOLN: INTRAVENOUS | Status: DC

## 2012-06-16 MED ORDER — FENTANYL CITRATE 0.05 MG/ML IJ SOLN
INTRAMUSCULAR | Status: DC | PRN
  Administered 2012-06-16: 25 ug via INTRAVENOUS
  Administered 2012-06-16: 50 ug via INTRAVENOUS
  Administered 2012-06-16: 25 ug via INTRAVENOUS

## 2012-06-16 MED ORDER — PROPOFOL 10 MG/ML IV BOLUS
INTRAVENOUS | Status: DC | PRN
  Administered 2012-06-16: 180 mg via INTRAVENOUS

## 2012-06-16 MED ORDER — ACETAMINOPHEN 10 MG/ML IV SOLN: 1000.0000 mg | Freq: Once | INTRAVENOUS | Status: DC | PRN

## 2012-06-16 MED ORDER — 0.9 % SODIUM CHLORIDE (POUR BTL) OPTIME
TOPICAL | Status: DC | PRN
  Administered 2012-06-16: 250 mL

## 2012-06-16 MED ORDER — LACTATED RINGERS IV SOLN
INTRAVENOUS | Status: DC
  Administered 2012-06-16 (×2): via INTRAVENOUS

## 2012-06-16 MED ORDER — OXYCODONE HCL 5 MG PO TABS: 5.0000 mg | ORAL_TABLET | ORAL | Status: DC | PRN

## 2012-06-16 MED ORDER — ONDANSETRON HCL 4 MG/2ML IJ SOLN
INTRAMUSCULAR | Status: DC | PRN
  Administered 2012-06-16: 4 mg via INTRAVENOUS

## 2012-06-16 MED ORDER — HYDROMORPHONE HCL PF 1 MG/ML IJ SOLN: 0.2500 mg | INTRAMUSCULAR | Status: DC | PRN

## 2012-06-16 MED ORDER — DEXAMETHASONE SODIUM PHOSPHATE 4 MG/ML IJ SOLN
INTRAMUSCULAR | Status: DC | PRN
  Administered 2012-06-16: 10 mg via INTRAVENOUS

## 2012-06-16 MED ORDER — MIDAZOLAM HCL 2 MG/ML PO SYRP: 12.0000 mg | ORAL_SOLUTION | Freq: Once | ORAL | Status: DC | PRN

## 2012-06-16 SURGICAL SUPPLY — 70 items
BANDAGE CONFORM 3  STR LF (GAUZE/BANDAGES/DRESSINGS) ×2 IMPLANT
BANDAGE ESMARK 6X9 LF (GAUZE/BANDAGES/DRESSINGS) ×1 IMPLANT
BANDAGE GAUZE ELAST BULKY 4 IN (GAUZE/BANDAGES/DRESSINGS) IMPLANT
BIT DRILL 1.7 CANN W/AO CONN (BIT) ×2 IMPLANT
BIT DRILL 1.7 LOW PROFILE (BIT) ×2 IMPLANT
BLADE AVERAGE 25X9 (BLADE) ×2 IMPLANT
BLADE SURG 15 STRL LF DISP TIS (BLADE) ×2 IMPLANT
BLADE SURG 15 STRL SS (BLADE) ×2
BNDG COHESIVE 4X5 TAN STRL (GAUZE/BANDAGES/DRESSINGS) ×2 IMPLANT
BNDG ESMARK 4X9 LF (GAUZE/BANDAGES/DRESSINGS) IMPLANT
BNDG ESMARK 6X9 LF (GAUZE/BANDAGES/DRESSINGS) ×2
CAP PIN ORTHO PINK (CAP) ×2 IMPLANT
CAP PIN PROTECTOR ORTHO WHT (CAP) ×2 IMPLANT
CHLORAPREP W/TINT 26ML (MISCELLANEOUS) ×2 IMPLANT
CLOTH BEACON ORANGE TIMEOUT ST (SAFETY) ×2 IMPLANT
COVER TABLE BACK 60X90 (DRAPES) ×2 IMPLANT
CUFF TOURNIQUET SINGLE 18IN (TOURNIQUET CUFF) IMPLANT
CUFF TOURNIQUET SINGLE 34IN LL (TOURNIQUET CUFF) ×2 IMPLANT
DRAPE EXTREMITY T 121X128X90 (DRAPE) ×2 IMPLANT
DRAPE OEC MINIVIEW 54X84 (DRAPES) ×2 IMPLANT
DRAPE U-SHAPE 47X51 STRL (DRAPES) ×2 IMPLANT
DRSG EMULSION OIL 3X3 NADH (GAUZE/BANDAGES/DRESSINGS) ×2 IMPLANT
DRSG PAD ABDOMINAL 8X10 ST (GAUZE/BANDAGES/DRESSINGS) ×2 IMPLANT
ELECT REM PT RETURN 9FT ADLT (ELECTROSURGICAL) ×2
ELECTRODE REM PT RTRN 9FT ADLT (ELECTROSURGICAL) ×1 IMPLANT
GAUZE SPONGE 4X4 16PLY XRAY LF (GAUZE/BANDAGES/DRESSINGS) IMPLANT
GLOVE BIO SURGEON STRL SZ8 (GLOVE) ×2 IMPLANT
GLOVE BIOGEL PI IND STRL 6.5 (GLOVE) ×1 IMPLANT
GLOVE BIOGEL PI IND STRL 8 (GLOVE) ×1 IMPLANT
GLOVE BIOGEL PI INDICATOR 6.5 (GLOVE) ×1
GLOVE BIOGEL PI INDICATOR 8 (GLOVE) ×1
GLOVE ECLIPSE 6.5 STRL STRAW (GLOVE) ×6 IMPLANT
GLOVE EXAM NITRILE MD LF STRL (GLOVE) ×2 IMPLANT
GOWN PREVENTION PLUS XLARGE (GOWN DISPOSABLE) ×4 IMPLANT
GOWN PREVENTION PLUS XXLARGE (GOWN DISPOSABLE) ×2 IMPLANT
GUIDEWIRE .08 (WIRE) ×4 IMPLANT
K-WIRE 102X1.4 (WIRE) ×4 IMPLANT
KWIRE 4.0 X .045IN (WIRE) IMPLANT
NEEDLE HYPO 22GX1.5 SAFETY (NEEDLE) IMPLANT
NEEDLE HYPO 25X1 1.5 SAFETY (NEEDLE) IMPLANT
NS IRRIG 1000ML POUR BTL (IV SOLUTION) ×2 IMPLANT
PACK BASIN DAY SURGERY FS (CUSTOM PROCEDURE TRAY) ×2 IMPLANT
PAD CAST 4YDX4 CTTN HI CHSV (CAST SUPPLIES) ×2 IMPLANT
PADDING CAST COTTON 4X4 STRL (CAST SUPPLIES) ×2
PENCIL BUTTON HOLSTER BLD 10FT (ELECTRODE) ×2 IMPLANT
SANITIZER HAND PURELL 535ML FO (MISCELLANEOUS) ×2 IMPLANT
SCREW CORTICAL 2.3X16 (Screw) ×2 IMPLANT
SCREW CORTICAL 2.3X18 (Screw) ×2 IMPLANT
SCREW LAG CANN 2.3X20MM (Screw) ×2 IMPLANT
SCREW QUICK FIX 2X14 (Screw) ×2 IMPLANT
SHEET MEDIUM DRAPE 40X70 STRL (DRAPES) ×2 IMPLANT
SPONGE GAUZE 4X4 12PLY (GAUZE/BANDAGES/DRESSINGS) ×4 IMPLANT
SPONGE LAP 18X18 X RAY DECT (DISPOSABLE) ×2 IMPLANT
STOCKINETTE 6  STRL (DRAPES) ×1
STOCKINETTE 6 STRL (DRAPES) ×1 IMPLANT
STRIP CLOSURE SKIN 1/2X4 (GAUZE/BANDAGES/DRESSINGS) IMPLANT
SUCTION FRAZIER TIP 10 FR DISP (SUCTIONS) IMPLANT
SUT ETHILON 3 0 PS 1 (SUTURE) ×4 IMPLANT
SUT MNCRL AB 3-0 PS2 18 (SUTURE) ×2 IMPLANT
SUT PROLENE 3 0 PS 1 (SUTURE) IMPLANT
SUT VIC AB 0 SH 27 (SUTURE) ×2 IMPLANT
SUT VIC AB 2-0 PS2 27 (SUTURE) IMPLANT
SUT VIC AB 3-0 PS1 18 (SUTURE)
SUT VIC AB 3-0 PS1 18XBRD (SUTURE) IMPLANT
SYR BULB 3OZ (MISCELLANEOUS) ×2 IMPLANT
SYR CONTROL 10ML LL (SYRINGE) IMPLANT
TOWEL OR 17X24 6PK STRL BLUE (TOWEL DISPOSABLE) ×4 IMPLANT
TUBE CONNECTING 20X1/4 (TUBING) IMPLANT
UNDERPAD 30X30 INCONTINENT (UNDERPADS AND DIAPERS) ×2 IMPLANT
YANKAUER SUCT BULB TIP NO VENT (SUCTIONS) IMPLANT

## 2012-06-16 NOTE — Anesthesia Preprocedure Evaluation (Signed)
Anesthesia Evaluation  Patient identified by MRN, date of birth, ID band Patient awake    Reviewed: Allergy & Precautions, H&P , Patient's Chart, lab work & pertinent test results  Airway Mallampati: II      Dental  (+) Teeth Intact and Dental Advisory Given   Pulmonary  breath sounds clear to auscultation        Cardiovascular Rhythm:Regular Rate:Normal     Neuro/Psych    GI/Hepatic   Endo/Other    Renal/GU      Musculoskeletal   Abdominal   Peds  Hematology   Anesthesia Other Findings   Reproductive/Obstetrics                           Anesthesia Physical Anesthesia Plan  ASA: II  Anesthesia Plan: General   Post-op Pain Management:    Induction: Intravenous  Airway Management Planned: LMA  Additional Equipment:   Intra-op Plan:   Post-operative Plan:   Informed Consent: I have reviewed the patients History and Physical, chart, labs and discussed the procedure including the risks, benefits and alternatives for the proposed anesthesia with the patient or authorized representative who has indicated his/her understanding and acceptance.   Dental advisory given  Plan Discussed with: CRNA, Anesthesiologist and Surgeon  Anesthesia Plan Comments:         Anesthesia Quick Evaluation

## 2012-06-16 NOTE — Brief Op Note (Signed)
06/16/2012  10:10 AM  PATIENT:  [name]  47 y.o. female  PRE-OPERATIVE DIAGNOSIS:  left hallux valgus and second hammer toe  POST-OPERATIVE DIAGNOSIS:  same  Procedure(s): 1.  Left 1st MT scarf osteotomy 2.  Left hallux Akin osteotomy 3.  Left 2nd MT weil osteotomy 4.  Left 2nd hammertoe correction (PIP arthrodesis) 5.  AP and lateral radiographs of the left foot  SURGEON:  Toni Arthurs, MD  ASSISTANT: n/a  ANESTHESIA:   General, regional  EBL:  minimal   TOURNIQUET:   Total Tourniquet Time Documented: Thigh (Left) - 79 minutes Total: Thigh (Left) - 79 minutes   COMPLICATIONS:  None apparent  DISPOSITION:  Extubated, awake and stable to recovery.  DICTATION ID:  [id number]

## 2012-06-16 NOTE — Transfer of Care (Signed)
Immediate Anesthesia Transfer of Care Note  Patient: [name]  Procedure(s) Performed: Procedure(s): LEFT SCARF MODIFIED MCBRIDE AND SECOND Metatarsal WEIL (Left)  Patient Location: PACU  Anesthesia Type:GA combined with regional for post-op pain  Level of Consciousness: awake, sedated and patient cooperative  Airway & Oxygen Therapy: Patient Spontanous Breathing and Patient connected to face mask oxygen  Post-op Assessment: Report given to PACU RN and Post -op Vital signs reviewed and stable  Post vital signs: Reviewed and stable  Complications: No apparent anesthesia complications

## 2012-06-16 NOTE — Progress Notes (Signed)
Assisted Dr. Joslin with left, ultrasound guided, popliteal block. Side rails up, monitors on throughout procedure. See vital signs in flow sheet. Tolerated Procedure well. 

## 2012-06-16 NOTE — Anesthesia Procedure Notes (Addendum)
Anesthesia Regional Block:  Popliteal block  Pre-Anesthetic Checklist: ,, timeout performed, Correct Patient, Correct Site, Correct Laterality, Correct Procedure, Correct Position, site marked, Risks and benefits discussed,  Surgical consent,  Pre-op evaluation,  At surgeon's request and post-op pain management  Laterality: Left  Prep: Betadine and chloraprep       Needles:  Injection technique: Single-shot  Needle Type: Echogenic Stimulator Needle      Needle Gauge: 22 and 22 G    Additional Needles:  Procedures: ultrasound guided (picture in chart) and nerve stimulator Popliteal block Narrative:  Start time: 06/16/2012 7:50 AM End time: 06/16/2012 8:00 AM Injection made incrementally with aspirations every 5 mL.  Performed by: Personally   Additional Notes: 20 cc 0.5% marcaine with 1:200 Epi injected easily  Popliteal block Procedure Name: LMA Insertion Date/Time: 06/16/2012 8:27 AM Performed by: Gar Gibbon Pre-anesthesia Checklist: Patient identified, Emergency Drugs available, Suction available and Patient being monitored Patient Re-evaluated:Patient Re-evaluated prior to inductionOxygen Delivery Method: Circle System Utilized Preoxygenation: Pre-oxygenation with 100% oxygen Intubation Type: IV induction Ventilation: Mask ventilation without difficulty LMA: LMA inserted LMA Size: 4.0 Number of attempts: 1 Airway Equipment and Method: bite block Placement Confirmation: positive ETCO2 Tube secured with: Tape Dental Injury: Teeth and Oropharynx as per pre-operative assessment     Anesthesia Regional Block:   Narrative:

## 2012-06-16 NOTE — Anesthesia Postprocedure Evaluation (Signed)
  Anesthesia Post-op Note  Patient: [name]  Procedure(s) Performed: Procedure(s): LEFT SCARF MODIFIED MCBRIDE AND SECOND Metatarsal WEIL (Left)  Patient Location: PACU  Anesthesia Type:General and GA combined with regional for post-op pain  Level of Consciousness: awake, alert  and oriented  Airway and Oxygen Therapy: Patient Spontanous Breathing  Post-op Pain: none  Post-op Assessment: Post-op Vital signs reviewed, Patient's Cardiovascular Status Stable, Respiratory Function Stable, Patent Airway and Pain level controlled  Post-op Vital Signs: stable  Complications: No apparent anesthesia complications

## 2012-06-16 NOTE — H&P (Signed)
[name] is an 47 y.o. female.   Chief Complaint: left bunion and 2nd hammertoe HPI: 47 y/o female with painful left foot bunion and 2nd hammertoe deformities.  She presents now for operative treatment having failed non op treatment including activity modification, shoewear modification, oral pain meds and toe spacers.  Past Medical History  Diagnosis Date  . Irritable bowel syndrome (IBS)     no current med.  . Hypertension     under control with med., has been on med. x 3 yr.  . Wears hearing aid     bilateral  . Heart murmur     since birth, has had no testing, no problems  . Eczema     legs  . Hallux valgus 06/2012    left  . Hammertoe 06/2012    left second    Past Surgical History  Procedure Laterality Date  . Tympanostomy tube placement  10/10/2009  . Exploratory tympanotomy Left 06/25/2000    with lysis of adhesions  . Tympanoplasty Left 06/25/2000    Family History  Problem Relation Age of Onset  . Alzheimer's disease Mother    Social History:  reports that she has never smoked. She has never used smokeless tobacco. She reports that  drinks alcohol. She reports that she does not use illicit drugs.  Allergies:  Allergies  Allergen Reactions  . Erythromycin Nausea And Vomiting    FACIAL NUMBNESS  . Nitrofurantoin Other (See Comments)    GI UPSET    Medications Prior to Admission  Medication Sig Dispense Refill  . lisinopril-hydrochlorothiazide (PRINZIDE,ZESTORETIC) 20-12.5 MG per tablet Take 1 tablet by mouth daily.  30 tablet  6  . norethindrone-ethinyl estradiol (JUNEL FE,GILDESS FE,LOESTRIN FE) 1-20 MG-MCG tablet Take 1 tablet by mouth daily.        Results for orders placed during the hospital encounter of June 28, 2012 (from the past 48 hour(s))  POCT HEMOGLOBIN-HEMACUE     Status: None   Collection Time    06-28-12  7:28 AM      Result Value Range   Hemoglobin 13.8  12.0 - 15.0 g/dL   No results found.  ROS  No recent f/c/nv/wt loss.  Blood  pressure 123/79, pulse 95, temperature 98 F (36.7 C), temperature source Oral, resp. rate 20, height 5\' 1"  (1.549 m), weight 66.225 kg (146 lb), last menstrual period 05/19/2012, SpO2 98.00%. Physical Exam  wn wd woman in nad.  A and O x 4.  Mood and affect normal.  EOMI.  Resp unlabored.  R forefoot with bunion deformity and 2nd hammertoe deformity.  Skin healthy and intact.  No lymphadenopathy.  5/5 strength in PF and DF of the ankle.  Sens to LT intact throughout the foot.  2+ dp and pt pulses.  Assessment/Plan Painful left hallux valgus and 2nd hammertoe deformities - to OR for left foot scarf osteotomy and modified McBRide bunionectomy and 2nd MT weil osteotomy and hammertoe correction.  The risks and benefits of the alternative treatment options have been discussed in detail.  The patient wishes to proceed with surgery and specifically understands risks of bleeding, infection, nerve damage, blood clots, need for additional surgery, amputation and death.   Toni Arthurs 28-Jun-2012, 7:57 AM

## 2012-06-17 NOTE — Op Note (Signed)
NAMECAMERON, [name] NO.:  [id number]  MEDICAL RECORD NO.:  [id number]  LOCATION:                                 FACILITY:  PHYSICIAN:  Toni Arthurs, MD        DATE OF BIRTH:  November 13, 1965  DATE OF PROCEDURE:  06/16/2012 DATE OF DISCHARGE:                              OPERATIVE REPORT   PREOPERATIVE DIAGNOSIS:  Left hallux valgus and second hammertoe deformities.  POSTOPERATIVE DIAGNOSIS:  Left hallux valgus and second hammertoe deformities.  PROCEDURE: 1. Left first metatarsal scarf osteotomy. 2. Left hallux proximal phalanx Akin osteotomy. 3. Left second metatarsal Weil osteotomy. 4. Left second hammertoe correction (PIP arthrodesis). 5. AP and lateral radiographs of the left foot.  SURGEON:  Toni Arthurs, MD  ANESTHESIA:  General, regional.  ESTIMATED BLOOD LOSS:  Minimal.  TOURNIQUET TIME:  79 minutes at 225 mmHg.  COMPLICATIONS:  None apparent.  DISPOSITION:  Extubated, awake, and stable to recovery.  INDICATIONS FOR PROCEDURE:  The patient is a 47 year old woman who has a long history of a painful left hallux valgus and second hammertoe deformity.  She has overloaded second metatarsal head as well.  She presents now for operative treatment of this painful limiting condition. She has failed treatment with activity modification while on anti- inflammatories, shoe wear modification and toe spacers.  She understands the risks and benefits of the alternative treatment options and elects surgical treatment.  She specifically understands risks of bleeding, infection, nerve damage, blood clots, need for additional surgery, recurrence of her bunion, amputation, and death.  PROCEDURE IN DETAIL:  After preoperative consent was obtained and the correct operative site was identified, the patient was brought to the operating room and placed supine on the operating table.  General anesthesia was induced.  Preoperative antibiotics were  administered. Surgical time-out was taken.  Left lower extremity was prepped and draped in standard sterile fashion with the tourniquet around the thigh. The extremity was exsanguinated and tourniquet was inflated to 225 mmHg. A longitudinal incision was made at the lateral aspect of the first web space.  Sharp dissection was carried down through the skin and subcutaneous tissue.  The intermetatarsal ligament was identified and was divided.  The joint capsule was incised between the sesamoid and the metatarsal head.  Several small stab incisions were made in the lateral joint capsule adjacent to the joint.  The hallux could then be positioned in 30 degrees of varus passively.  Attention was then turned to the medial aspect of the forefoot where longitudinal incision was made over the first metatarsal medial eminence.  Sharp dissection was carried down through the skin and subcutaneous tissue.  The medial joint capsule was incised in line with its fibers and released plantarly and dorsally exposing the metatarsal head.  The patient was noted to have an increased distal metatarsal articular angle.  The medial eminence was resected with the oscillating saw in line with the first metatarsal shaft.  Two 0.054 K-wires were inserted perpendicular to the shaft of the metatarsal to mark the corners of the scarf osteotomy.  The osteotomy was then made with an oscillating saw removing a small wafer of bone proximally and a  small wedge of bone distally in order to correct the DMAA.  The osteotomy site was then mobilized and the head was translated laterally.  The distal fragment was rotated correcting the distal metatarsal articular angle. The osteotomy was provisionally fixed with tenaculum.  An AP fluoroscopic image confirmed appropriate correction of the DMAA and the intermetatarsal angle.  The osteotomy was then fixed with two 2.3 mm screws, the proximal one was bicortical and the distal one  was unicortical into the head of the metatarsal.  The overhanging bone was then trimmed with the oscillating saw medially.  A simulated weightbearing view was obtained showing appropriate correction of the IM angle and the coverage of the sesamoids.  The patient was still noticed to have a bit of residual hallux valgus interphalangeus.  The decision was made at that time to proceed with an Akin osteotomy.  The base of the proximal phalanx was exposed.  A K-wire was inserted deep to the subchondral bone.  Appropriate depth of the osteotomy cut was verified on AP and lateral fluoroscopic images.  A medial closing wedge osteotomy was then created removing a wafer of bone medially.  The osteotomy site was closed down and fixed with a partially threaded cannulated 2.3 mm screw.  Attention was then turned to the dorsal incision again.  Blunt dissection was carried down over the MP joint at the second metatarsal. The joint capsule was incised and the head of the metatarsal was exposed.  A Weil osteotomy was then made with the oscillating saw, removing a small wafer of bone distally.  The head was allowed to retract proximally several millimeters and was fixed with a 2 mm partially threaded cancellous screw.  The overhanging bone was trimmed with a rongeur.  The patient was still noted to have a bit of residual hammertoe deformity, so a transverse incision was made over the PIP joint.  Sharp dissection was carried down through the skin and extensor tendon.  The head of the proximal phalanx and the base of the middle phalanx were resected with the oscillating saw.  The joint was irrigated and a 0.054 K-wire was inserted antegrade out through the tip of the toe and then retrograde across the PIP and MP joints.  The toe was held in a straight position.  Final AP and lateral fluoroscopic images confirmed appropriate position and length of all hardware and appropriate correction of the bunion  deformity and the second hammertoe.  The pin was then bent, trimmed, and capped.  The wounds were all irrigated copiously.  Medially, the joint capsule was repaired with imbricating sutures of 0 Vicryl.  The skin incision was closed with a running 3-0 nylon.  Dorsally, horizontal mattress sutures were used to close the second toe incision and the dorsal foot incision was closed with inverted simple sutures of 3-0 Monocryl and a running 3-0 nylon. Sterile dressings were applied followed by a bunion dressing. Tourniquet was released at 79 minutes.  The patient was then awakened from anesthesia and transported to recovery room in stable condition.  FOLLOWUP PLAN:  The patient will be weightbearing as tolerated on her left foot in a Darco shoe.  She will follow up with me in 2 weeks for suture removal.     Toni Arthurs, MD     JH/MEDQ  D:  06/16/2012  T:  06/17/2012  Job:  [id number]

## 2012-06-17 NOTE — Op Note (Signed)
NAMEAZELIE, NOGUERA NO.:  [id number]  MEDICAL RECORD NO.:  [id number]  LOCATION:                                 FACILITY:  PHYSICIAN:  Toni Arthurs, MD        DATE OF BIRTH:  15-Jul-1965  DATE OF PROCEDURE:  06/16/2012 DATE OF DISCHARGE:  06/16/2012                              OPERATIVE REPORT   ADDENDUM:  AP and lateral simulated weightbearing radiographs of the left foot were obtained today.  These show interval correction of the hallux valgus and second hammer toe deformities with first and second metatarsal osteotomies as well as a proximal phalanx osteotomy of the hallux.  The hardware is appropriately positioned, and no acute injury is noted.  Pins were noted traverse the IP and MP joints of the second toe.     Toni Arthurs, MD     JH/MEDQ  D:  06/16/2012  T:  06/17/2012  Job:  [id number]

## 2012-06-22 ENCOUNTER — Encounter (HOSPITAL_BASED_OUTPATIENT_CLINIC_OR_DEPARTMENT_OTHER): Payer: Self-pay | Admitting: Orthopedic Surgery

## 2013-01-27 ENCOUNTER — Other Ambulatory Visit: Payer: Self-pay | Admitting: Family Medicine

## 2013-02-27 ENCOUNTER — Other Ambulatory Visit: Payer: Self-pay | Admitting: *Deleted

## 2013-02-27 MED ORDER — LISINOPRIL-HYDROCHLOROTHIAZIDE 20-12.5 MG PO TABS: ORAL_TABLET | ORAL | Status: DC

## 2013-02-27 NOTE — Telephone Encounter (Signed)
Pt informed we can only send over 2 weeks worth of BP meds.  Meyer Cory, LPN

## 2013-03-04 ENCOUNTER — Encounter: Payer: Self-pay | Admitting: Emergency Medicine

## 2013-03-04 ENCOUNTER — Emergency Department
Admission: EM | Admit: 2013-03-04 | Discharge: 2013-03-04 | Disposition: A | Discharge status: Home / Self Care | Payer: 59 | Source: Home / Self Care | Attending: Family Medicine | Admitting: Family Medicine

## 2013-03-04 DIAGNOSIS — J329 Chronic sinusitis, unspecified: Secondary | ICD-10-CM

## 2013-03-04 DIAGNOSIS — H6690 Otitis media, unspecified, unspecified ear: Secondary | ICD-10-CM

## 2013-03-04 MED ORDER — NEOMYCIN-POLYMYXIN-HC 3.5-10000-1 OT SOLN: 3.0000 [drp] | Freq: Four times a day (QID) | OTIC | Status: AC

## 2013-03-04 MED ORDER — FLUCONAZOLE 150 MG PO TABS: 150.0000 mg | ORAL_TABLET | Freq: Once | ORAL | Status: DC

## 2013-03-04 MED ORDER — AMOXICILLIN-POT CLAVULANATE 875-125 MG PO TABS: 1.0000 | ORAL_TABLET | Freq: Two times a day (BID) | ORAL | Status: DC

## 2013-03-04 NOTE — ED Provider Notes (Signed)
CSN: [id number]     Arrival date & time 03/04/13  1505 History   First MD Initiated Contact with Patient 03/04/13 1542     Chief Complaint  Patient presents with  . Nasal Congestion  . Dizziness  . Cough   HPI URI Symptoms Onset: 3 weeks  Description: sinus pressure, nasal congestion, drainage, cough, bilteral ear discharge   Modifying factors:  S/p bilateral ET tubes for recurrent ear infections   Symptoms Nasal discharge: yes Fever: no Sore throat: no Cough: no Wheezing: no Ear pain: no GI symptoms: no Sick contacts: no  Red Flags  Stiff neck: no Dyspnea: no Rash: no Swallowing difficulty: no  Sinusitis Risk Factors Headache/face pain: mild Double sickening: no tooth pain: no  Allergy Risk Factors Sneezing: no Itchy scratchy throat: no Seasonal symptoms: no  Flu Risk Factors Headache: no muscle aches: no severe fatigue: no   Past Medical History  Diagnosis Date  . Irritable bowel syndrome (IBS)     no current med.  . Hypertension     under control with med., has been on med. x 3 yr.  . Wears hearing aid     bilateral  . Heart murmur     since birth, has had no testing, no problems  . Eczema     legs  . Hallux valgus 06/2012    left  . Hammertoe 06/2012    left second   Past Surgical History  Procedure Laterality Date  . Tympanostomy tube placement  10/10/2009  . Exploratory tympanotomy Left 06/25/2000    with lysis of adhesions  . Tympanoplasty Left 06/25/2000  . Hammer toe surgery Left 06/16/2012    Procedure: LEFT SCARF MODIFIED MCBRIDE AND SECOND Metatarsal WEIL;  Surgeon: Toni Arthurs, MD;  Location: [facility];  Service: Orthopedics;  Laterality: Left;   Family History  Problem Relation Age of Onset  . Alzheimer's disease Mother    History  Substance Use Topics  . Smoking status: Never Smoker   . Smokeless tobacco: Never Used  . Alcohol Use: Yes     Comment: occasionally   OB History   Grav Para Term Preterm Abortions  TAB SAB Ect Mult Living                 Review of Systems  All other systems reviewed and are negative.    Allergies  Erythromycin and Nitrofurantoin  Home Medications   Current Outpatient Rx  Name  Route  Sig  Dispense  Refill  . lisinopril-hydrochlorothiazide (PRINZIDE,ZESTORETIC) 20-12.5 MG per tablet      take 1 tablet by mouth once daily   15 tablet   0     MUST MAKE APPOINTMENT BEFORE ANY FURTHER REFILLS   . norethindrone-ethinyl estradiol (JUNEL FE,GILDESS FE,LOESTRIN FE) 1-20 MG-MCG tablet   Oral   Take 1 tablet by mouth daily.         Marland Kitchen oxyCODONE (ROXICODONE) 5 MG immediate release tablet   Oral   Take 1-2 tablets (5-10 mg total) by mouth every 4 (four) hours as needed for pain.   50 tablet   0    BP 122/82  Pulse 95  Temp(Src) 97.8 F (36.6 C) (Oral)  Resp 16  Ht 5\' 1"  (1.549 m)  Wt 147 lb (66.679 kg)  BMI 27.79 kg/m2  SpO2 97% Physical Exam  Constitutional: She is oriented to person, place, and time. She appears well-developed and well-nourished.  HENT:  Head: Normocephalic and atraumatic.  Bilateral ET tubes,  mild drainage bilaterally  Minimal tenderness to otoscopic evaluation    Eyes: Conjunctivae are normal. Pupils are equal, round, and reactive to light.  Neck: Normal range of motion. Neck supple.  Cardiovascular: Normal rate and regular rhythm.   Pulmonary/Chest: Effort normal and breath sounds normal.  Abdominal: Soft.  Musculoskeletal: Normal range of motion.  Neurological: She is alert and oriented to person, place, and time.  Skin: Skin is warm.    ED Course  Procedures (including critical care time) Labs Review Labs Reviewed - No data to display Imaging Review No results found.  EKG Interpretation    Date/Time:    Ventricular Rate:    PR Interval:    QRS Duration:   QT Interval:    QTC Calculation:   R Axis:     Text Interpretation:              MDM   1. Sinusitis   2. Otitis, unspecified laterality     Will place on augmentin and cortisporin otic for infectious coverage.  Discussed infectious and ENT red flags at length.  Follow up with ENT if sxs persist despite treatment.  Follow up as needed.    The patient and/or caregiver has been counseled thoroughly with regard to treatment plan and/or medications prescribed including dosage, schedule, interactions, rationale for use, and possible side effects and they verbalize understanding. Diagnoses and expected course of recovery discussed and will return if not improved as expected or if the condition worsens. Patient and/or caregiver verbalized understanding.         Doree Albee, MD 03/04/13 [phone]

## 2013-03-04 NOTE — ED Notes (Signed)
Reports 3 week history of congestion, cough, ear discharge. Has had Flu vaccination.

## 2013-03-07 ENCOUNTER — Ambulatory Visit (INDEPENDENT_AMBULATORY_CARE_PROVIDER_SITE_OTHER): Payer: 59 | Admitting: Family Medicine

## 2013-03-07 ENCOUNTER — Encounter: Payer: Self-pay | Admitting: Family Medicine

## 2013-03-07 VITALS — BP 119/67 | HR 114 | Temp 97.6°F | Wt 144.0 lb

## 2013-03-07 DIAGNOSIS — I10 Essential (primary) hypertension: Secondary | ICD-10-CM

## 2013-03-07 DIAGNOSIS — H60399 Other infective otitis externa, unspecified ear: Secondary | ICD-10-CM

## 2013-03-07 DIAGNOSIS — H6091 Unspecified otitis externa, right ear: Secondary | ICD-10-CM

## 2013-03-07 MED ORDER — LISINOPRIL-HYDROCHLOROTHIAZIDE 20-12.5 MG PO TABS: ORAL_TABLET | ORAL | Status: DC

## 2013-03-07 NOTE — Progress Notes (Signed)
   Subjective:    Patient ID: [name], female    DOB: May 01, 1965, 47 y.o.   MRN: [id number]  HPI HTN -  Pt denies chest pain, SOB, dizziness, or heart palpitations.  Taking meds as directed w/o problems.  Denies medication side effects.     Chemistry      Component Value Date/Time   NA 138 05/26/2012 0856   K 4.1 05/26/2012 0856   CL 107 05/26/2012 0856   CO2 20 05/26/2012 0856   BUN 18 05/26/2012 0856   CREATININE 1.06 05/26/2012 0856   CREATININE 0.94 10/07/2009 1350      Component Value Date/Time   CALCIUM 8.9 05/26/2012 0856   ALKPHOS 65 05/26/2012 0856   AST 20 05/26/2012 0856   ALT 16 05/26/2012 0856   BILITOT 0.7 05/26/2012 0856     `  Recently seen in urgent care for sinusitis as well as bilateral otitis media. She still on antibiotics for this. Her left ear is feeling better but still having significant discomfort and drainage from the right ear. She's even noticed a little bit of blood. She does have bilateral eustachian tubes. She's also using the drops she was given.  Review of Systems     Objective:   Physical Exam  Constitutional: She is oriented to person, place, and time. She appears well-developed and well-nourished.  HENT:  Head: Normocephalic and atraumatic.  Right Ear: External ear normal.  Left Ear: External ear normal.  Right ear with white debris on ear drum and tube open with bubble of fluid. Left TM is thick and white. No erythema.  Tube is open and clear.  Canal is nromal.   Cardiovascular: Normal rate, regular rhythm and normal heart sounds.   Pulmonary/Chest: Effort normal and breath sounds normal.  Neurological: She is alert and oriented to person, place, and time.  Skin: Skin is warm and dry.  Psychiatric: She has a normal mood and affect. Her behavior is normal.          Assessment & Plan:  HTN -  well controlled. Check BMP. Followup in 6 months. Next  Right otitis externa-continue use. Call if not continuing to improve or get better.  She's been on them for 2 days at this point. Can use ibuprofen or Tylenol for pain relief.

## 2013-08-28 ENCOUNTER — Ambulatory Visit: Payer: 59 | Admitting: Family Medicine

## 2013-09-07 ENCOUNTER — Ambulatory Visit: Payer: 59 | Admitting: Family Medicine

## 2013-09-18 ENCOUNTER — Ambulatory Visit: Payer: 59 | Admitting: Family Medicine

## 2013-09-21 ENCOUNTER — Encounter: Payer: Self-pay | Admitting: Family Medicine

## 2013-09-21 ENCOUNTER — Ambulatory Visit (INDEPENDENT_AMBULATORY_CARE_PROVIDER_SITE_OTHER): Payer: 59 | Admitting: Family Medicine

## 2013-09-21 VITALS — BP 121/72 | HR 99 | Wt 152.0 lb

## 2013-09-21 DIAGNOSIS — R059 Cough, unspecified: Secondary | ICD-10-CM

## 2013-09-21 DIAGNOSIS — R0681 Apnea, not elsewhere classified: Secondary | ICD-10-CM

## 2013-09-21 DIAGNOSIS — Z13220 Encounter for screening for lipoid disorders: Secondary | ICD-10-CM

## 2013-09-21 DIAGNOSIS — R0683 Snoring: Secondary | ICD-10-CM

## 2013-09-21 DIAGNOSIS — I10 Essential (primary) hypertension: Secondary | ICD-10-CM

## 2013-09-21 DIAGNOSIS — R05 Cough: Secondary | ICD-10-CM

## 2013-09-21 DIAGNOSIS — R0609 Other forms of dyspnea: Secondary | ICD-10-CM

## 2013-09-21 DIAGNOSIS — R0989 Other specified symptoms and signs involving the circulatory and respiratory systems: Secondary | ICD-10-CM

## 2013-09-21 DIAGNOSIS — T464X5A Adverse effect of angiotensin-converting-enzyme inhibitors, initial encounter: Secondary | ICD-10-CM

## 2013-09-21 DIAGNOSIS — T465X5A Adverse effect of other antihypertensive drugs, initial encounter: Secondary | ICD-10-CM

## 2013-09-21 MED ORDER — LISINOPRIL-HYDROCHLOROTHIAZIDE 20-12.5 MG PO TABS: ORAL_TABLET | ORAL | Status: DC

## 2013-09-21 MED ORDER — LOSARTAN POTASSIUM-HCTZ 50-12.5 MG PO TABS: 1.0000 | ORAL_TABLET | Freq: Every day | ORAL | Status: DC

## 2013-09-21 NOTE — Progress Notes (Signed)
Subjective:    Patient ID: [name], female    DOB: 19-Aug-1965, 48 y.o.   MRN: [id number]  HPI Hypertension- Pt denies chest pain, SOB, dizziness, or heart palpitations.  Taking meds as directed w/o problems.  Denies medication side effects.  She has noticed a dry ticklish cough for the last couple of months. She denies any recent illnesses post nasal drip for allergies. No shortness of breath or fevers or chills. No sore throat or ear pains. She has bilateral tympanostomy tubes.  Snoring- she thinks she might need to be tested for sleep apnea. Her husband says that she's been snoring and lately has been stopping breathing per her husband. She does have some daytime sleepiness. She wants to know if the mask is typically how people are treated.  Review of Systems BP 121/72  Pulse 99  Wt 152 lb (68.947 kg)    Allergies  Allergen Reactions  . Erythromycin Nausea And Vomiting    FACIAL NUMBNESS  . Nitrofurantoin Other (See Comments)    GI UPSET  . Lisinopril Cough    Past Medical History  Diagnosis Date  . Irritable bowel syndrome (IBS)     no current med.  . Hypertension     under control with med., has been on med. x 3 yr.  . Wears hearing aid     bilateral  . Heart murmur     since birth, has had no testing, no problems  . Eczema     legs  . Hallux valgus 06/2012    left  . Hammertoe 06/2012    left second    Past Surgical History  Procedure Laterality Date  . Tympanostomy tube placement  10/10/2009  . Exploratory tympanotomy Left 06/25/2000    with lysis of adhesions  . Tympanoplasty Left 06/25/2000  . Hammer toe surgery Left 06/16/2012    Procedure: LEFT SCARF MODIFIED MCBRIDE AND SECOND Metatarsal WEIL;  Surgeon: Wylene Simmer, MD;  Location: [facility];  Service: Orthopedics;  Laterality: Left;    History   Social History  . Marital Status: Married    Spouse Name: N/A    Number of Children: 2   . Years of Education: N/A   Occupational  History  . works from home     Social History Main Topics  . Smoking status: Never Smoker   . Smokeless tobacco: Never Used  . Alcohol Use: Yes     Comment: occasionally  . Drug Use: No  . Sexual Activity: Not on file   Other Topics Concern  . Not on file   Social History Narrative   No regular exercise.      Family History  Problem Relation Age of Onset  . Alzheimer's disease Mother   . Brain cancer Father     Glioblastoma multiforma.     Outpatient Encounter Prescriptions as of 09/21/2013  Medication Sig  . norethindrone-ethinyl estradiol (JUNEL FE,GILDESS FE,LOESTRIN FE) 1-20 MG-MCG tablet Take 1 tablet by mouth daily.  . [DISCONTINUED] lisinopril-hydrochlorothiazide (PRINZIDE,ZESTORETIC) 20-12.5 MG per tablet take 1 tablet by mouth once daily  . [DISCONTINUED] lisinopril-hydrochlorothiazide (PRINZIDE,ZESTORETIC) 20-12.5 MG per tablet take 1 tablet by mouth once daily  . [DISCONTINUED] lisinopril-hydrochlorothiazide (PRINZIDE,ZESTORETIC) 20-12.5 MG per tablet take 1 tablet by mouth once daily  . losartan-hydrochlorothiazide (HYZAAR) 50-12.5 MG per tablet Take 1 tablet by mouth daily.  . [DISCONTINUED] amoxicillin-clavulanate (AUGMENTIN) 875-125 MG per tablet Take 1 tablet by mouth 2 (two) times daily.  . [DISCONTINUED] fluconazole (DIFLUCAN) 150  MG tablet Take 1 tablet (150 mg total) by mouth once. Repeat if needed          Objective:   Physical Exam  Constitutional: She is oriented to person, place, and time. She appears well-developed and well-nourished.  HENT:  Head: Normocephalic and atraumatic.  Right Ear: External ear normal.  Nose: Nose normal.  Mouth/Throat: Oropharynx is clear and moist.  Tympanostomy tubes are well placed and a 10.  Eyes: Conjunctivae and EOM are normal. Pupils are equal, round, and reactive to light.  Neck: Neck supple. No thyromegaly present.  Cardiovascular: Normal rate, regular rhythm and normal heart sounds.   Pulmonary/Chest:  Effort normal and breath sounds normal.  Lymphadenopathy:    She has no cervical adenopathy.  Neurological: She is alert and oriented to person, place, and time.  Skin: Skin is warm and dry.  Psychiatric: She has a normal mood and affect. Her behavior is normal.          Assessment & Plan:  Hypertension-well-controlled today. She is due for a lipid panel and CMP. Followup in 6 months.  Snoring- Stop [name] performed today. We discussed the pros and cons of and lap sleep study versus home sleep study. She would prefer to do in-home study. Will refer for home sleep study. Stop Bang questionnaire score of 4 today. Will schedule for [name]  ACE inhibitor cough-will change to losartan. If her cough does not resolve in the next month then please let me know. Otherwise followup in 6 months. She will go for CMP and fasting lipid panel today.  Has had mammogram ordered by her gynecologist in February.

## 2013-09-22 LAB — LIPID PANEL
CHOL/HDL RATIO: 3.5 ratio
CHOLESTEROL: 159 mg/dL (ref 0–200)
HDL: 45 mg/dL (ref >39)
LDL Cholesterol: 87 mg/dL (ref 0–99)
TRIGLYCERIDES: 134 mg/dL (ref <150)
VLDL: 27 mg/dL (ref 0–40)

## 2013-09-22 LAB — COMPLETE METABOLIC PANEL WITH GFR
ALK PHOS: 87 U/L (ref 39–117)
ALT: 15 U/L (ref 0–35)
AST: 22 U/L (ref 0–37)
Albumin: 4 g/dL (ref 3.5–5.2)
BILIRUBIN TOTAL: 0.6 mg/dL (ref 0.2–1.2)
BUN: 17 mg/dL (ref 6–23)
CO2: 22 mEq/L (ref 19–32)
CREATININE: 0.99 mg/dL (ref 0.50–1.10)
Calcium: 8.8 mg/dL (ref 8.4–10.5)
Chloride: 103 mEq/L (ref 96–112)
GFR, Est African American: 78 mL/min
GFR, Est Non African American: 68 mL/min
Glucose, Bld: 81 mg/dL (ref 70–99)
Potassium: 4.2 mEq/L (ref 3.5–5.3)
Sodium: 137 mEq/L (ref 135–145)
Total Protein: 7 g/dL (ref 6.0–8.3)

## 2013-09-22 NOTE — Progress Notes (Signed)
Quick Note:  All labs are normal. ______ 

## 2013-10-02 ENCOUNTER — Ambulatory Visit (HOSPITAL_BASED_OUTPATIENT_CLINIC_OR_DEPARTMENT_OTHER): Payer: 59 | Attending: Internal Medicine | Admitting: *Deleted

## 2013-10-02 DIAGNOSIS — G4733 Obstructive sleep apnea (adult) (pediatric): Secondary | ICD-10-CM | POA: Insufficient documentation

## 2013-10-02 DIAGNOSIS — R0681 Apnea, not elsewhere classified: Secondary | ICD-10-CM

## 2013-10-02 DIAGNOSIS — R0989 Other specified symptoms and signs involving the circulatory and respiratory systems: Secondary | ICD-10-CM

## 2013-10-02 DIAGNOSIS — R0609 Other forms of dyspnea: Secondary | ICD-10-CM

## 2013-10-06 ENCOUNTER — Encounter (HOSPITAL_BASED_OUTPATIENT_CLINIC_OR_DEPARTMENT_OTHER): Payer: Self-pay

## 2013-10-06 DIAGNOSIS — R0681 Apnea, not elsewhere classified: Secondary | ICD-10-CM

## 2013-10-06 DIAGNOSIS — R0989 Other specified symptoms and signs involving the circulatory and respiratory systems: Secondary | ICD-10-CM

## 2013-10-06 DIAGNOSIS — R0609 Other forms of dyspnea: Secondary | ICD-10-CM

## 2013-10-06 NOTE — Sleep Study (Signed)
    NAME: [name] DATE OF BIRTH:  05-Aug-1965 MEDICAL RECORD NUMBER [id number]  LOCATION: [facility]  PHYSICIAN: Anorah Trias D  DATE OF STUDY: 10/02/2013  SLEEP STUDY TYPE: Out of Center Sleep Test                REFERRING PHYSICIAN: Hali Marry, *  INDICATION FOR STUDY: Hypersomnia with sleep apnea  EPWORTH SLEEPINESS SCORE:   9/24 HEIGHT:   5 feet 1 inch WEIGHT:   152 pounds  BMI 28.7 NECK SIZE: 16 in.  MEDICATIONS: Charted for review  IMPRESSION:  Severe obstructive sleep apnea/hypopneas syndrome, AHI 59.2 per hour. 413 total events scored including 6 central apneas, 145 obstructive apneas, 6 mixed apneas, 256 hypopneas. Events were mostly associated with supine sleep position. Snoring with oxygen desaturation to a nadir of 70% and a mean saturation of 91% on room air through the study. Mean heart rate 86.1 per minute    RECOMMENDATION:  Scores in this range are usually addressed first with CPAP. Other intervention may be selected on an individual basis.  Signed Salimatou Simone D. Fountain Derusha M.D. Deneise Lever Diplomate, American Board of Sleep Medicine  ELECTRONICALLY SIGNED ON:  10/06/2013, 1:53 PM [place] PH: [phone]   FX: [phone] [place]

## 2013-10-18 ENCOUNTER — Telehealth: Payer: Self-pay | Admitting: Family Medicine

## 2013-10-18 NOTE — Telephone Encounter (Signed)
Please call patient and let her note I do have her sleep study results back. I would like for her to schedule an appointment so that we can go over the results.

## 2013-10-19 NOTE — Telephone Encounter (Signed)
lvm asking her to call to schedule.[name] Allyn

## 2013-10-24 ENCOUNTER — Encounter: Payer: Self-pay | Admitting: Family Medicine

## 2013-10-24 ENCOUNTER — Ambulatory Visit (INDEPENDENT_AMBULATORY_CARE_PROVIDER_SITE_OTHER): Payer: 59 | Admitting: Family Medicine

## 2013-10-24 VITALS — BP 143/85 | HR 102 | Wt 154.0 lb

## 2013-10-24 DIAGNOSIS — G4733 Obstructive sleep apnea (adult) (pediatric): Secondary | ICD-10-CM

## 2013-10-25 DIAGNOSIS — G4733 Obstructive sleep apnea (adult) (pediatric): Secondary | ICD-10-CM | POA: Insufficient documentation

## 2013-10-25 MED ORDER — AMBULATORY NON FORMULARY MEDICATION: Status: DC

## 2013-10-25 NOTE — Progress Notes (Signed)
   Subjective:    Patient ID: [name], female    DOB: Sep 21, 1965, 48 y.o.   MRN: [id number]  HPI He just today to review her sleep study results. Patient had a home sleep study performed through the [facility]. Interpretation showed severe obstructive sleep apnea with an AHI of 59.2 per hour and 413 total events scored including 6 central apneas and 145 obstructive apneas and 6 next apneas. 256 hypopneas. Events were mostly associated with supine sleep position. Snoring with oxygen desaturation to a nadir of 70% with a mean saturation of 91% on room air mean heart rate was 86 per minute.   Review of Systems     Objective:   Physical Exam  Constitutional: She is oriented to person, place, and time. She appears well-developed and well-nourished.  HENT:  Head: Normocephalic and atraumatic.  Eyes: Conjunctivae and EOM are normal.  Cardiovascular: Normal rate.   Pulmonary/Chest: Effort normal.  Neurological: She is alert and oriented to person, place, and time.  Skin: Skin is warm and dry. No pallor.  Psychiatric: She has a normal mood and affect. Her behavior is normal.          Assessment & Plan:  Obstructive sleep apnea-we reviewed results today. New diagnosis. Recommend starting CPAP for treatment. Will order CPAP supplies including humidifier. We'll start with auto Pap and then get the downloaded information to help set her CPAP settings. She will follow up with me in about 6 weeks.  Time spent 20 minutes, greater than 50% spent counseling about objective sleep apnea.

## 2013-12-05 ENCOUNTER — Ambulatory Visit: Payer: 59 | Admitting: Family Medicine

## 2014-01-08 ENCOUNTER — Encounter: Payer: Self-pay | Admitting: Family Medicine

## 2014-01-08 ENCOUNTER — Ambulatory Visit (INDEPENDENT_AMBULATORY_CARE_PROVIDER_SITE_OTHER): Payer: 59 | Admitting: Family Medicine

## 2014-01-08 VITALS — BP 126/68 | HR 90 | Wt 155.0 lb

## 2014-01-08 DIAGNOSIS — G4733 Obstructive sleep apnea (adult) (pediatric): Secondary | ICD-10-CM

## 2014-01-08 DIAGNOSIS — I10 Essential (primary) hypertension: Secondary | ICD-10-CM

## 2014-01-08 MED ORDER — AMBULATORY NON FORMULARY MEDICATION: Status: DC

## 2014-01-08 NOTE — Progress Notes (Signed)
   Subjective:    Patient ID: [name], female    DOB: 1966/01/28, 48 y.o.   MRN: [id number]  HPI F/U CPAP. She has been wearing it nightly.  Says she is now up to wearing it for 6 hours.  Says occ will turn over and her seal will leak. She is feeling better during the daytime. No HA.  She says she plans on staring to lose weight.  She feels like the mask might be a little too small so she has ordered a larger mask. He is using [facility] a home health.  Hypertension- Pt denies chest pain, SOB, dizziness, or heart palpitations.  Taking meds as directed w/o problems.  Denies medication side effects.     Review of Systems     Objective:   Physical Exam  Constitutional: She is oriented to person, place, and time. She appears well-developed and well-nourished.  HENT:  Head: Normocephalic and atraumatic.  Cardiovascular: Normal rate, regular rhythm and normal heart sounds.   Pulmonary/Chest: Effort normal and breath sounds normal.  Neurological: She is alert and oriented to person, place, and time.  Skin: Skin is warm and dry.  Psychiatric: She has a normal mood and affect. Her behavior is normal.          Assessment & Plan:  OSA- she has actually done extremely well with her CPAP. She has 1 at 30 out of 30 days. And on average she has worn it for a #6-1/2 hours each night. We will call [facility] and set her CPAP to 7 cm of water pressure. If she feels this is too much then please call me and we can decrease to 16.95th percentile is around 16.7.   F/U in 2.5 months.  She should be getting her new mask soon. She is hoping she'll have fewer leaks with it.  HTN - well controlled.  In fact she has not taken her blood pressure medicine today and it looks great. If her blood pressure starts to become low then we can always consider decreasing her medication. Otherwise follow-up in 2 half months to recheck.

## 2014-01-11 ENCOUNTER — Encounter: Payer: Self-pay | Admitting: Family Medicine

## 2014-03-26 ENCOUNTER — Encounter: Payer: Self-pay | Admitting: Family Medicine

## 2014-03-26 ENCOUNTER — Ambulatory Visit (INDEPENDENT_AMBULATORY_CARE_PROVIDER_SITE_OTHER): Payer: 59 | Admitting: Family Medicine

## 2014-03-26 VITALS — BP 135/70 | HR 90 | Wt 154.0 lb

## 2014-03-26 DIAGNOSIS — G4733 Obstructive sleep apnea (adult) (pediatric): Secondary | ICD-10-CM

## 2014-03-26 DIAGNOSIS — I10 Essential (primary) hypertension: Secondary | ICD-10-CM

## 2014-03-26 LAB — BASIC METABOLIC PANEL WITH GFR
BUN: 15 mg/dL (ref 6–23)
CALCIUM: 8.7 mg/dL (ref 8.4–10.5)
CO2: 22 mEq/L (ref 19–32)
Chloride: 104 mEq/L (ref 96–112)
Creat: 0.98 mg/dL (ref 0.50–1.10)
GFR, EST NON AFRICAN AMERICAN: 68 mL/min
GFR, Est African American: 78 mL/min
Glucose, Bld: 93 mg/dL (ref 70–99)
Potassium: 3.8 mEq/L (ref 3.5–5.3)
SODIUM: 137 meq/L (ref 135–145)

## 2014-03-26 MED ORDER — LOSARTAN POTASSIUM-HCTZ 50-12.5 MG PO TABS: 1.0000 | ORAL_TABLET | Freq: Every day | ORAL | Status: DC

## 2014-03-26 NOTE — Progress Notes (Signed)
   Subjective:    Patient ID: [name], female    DOB: [birth year], 49 y.o.   MRN: [id number]  HPI OSA- did get a larger mask and fitting much better.  Not leaking as much. She has been using it consistantly  Hypertension- Pt denies chest pain, SOB, dizziness, or heart palpitations.  Taking meds as directed w/o problems.  Denies medication side effects.  She has been drinking grapefruit juice to improve he rhealth.      Review of Systems     Objective:   Physical Exam  Constitutional: She is oriented to person, place, and time. She appears well-developed and well-nourished.  HENT:  Head: Normocephalic and atraumatic.  Cardiovascular: Normal rate, regular rhythm and normal heart sounds.   Pulmonary/Chest: Effort normal and breath sounds normal.  Neurological: She is alert and oriented to person, place, and time.  Skin: Skin is warm and dry.  Psychiatric: She has a normal mood and affect. Her behavior is normal.          Assessment & Plan:  HTN- well controlled.  F/U in 6 months. Well controlled.    OSA - doing great  With it. She is using it consistantly.

## 2014-03-27 NOTE — Progress Notes (Signed)
Quick Note:  All labs are normal. ______ 

## 2014-09-24 ENCOUNTER — Ambulatory Visit (INDEPENDENT_AMBULATORY_CARE_PROVIDER_SITE_OTHER): Payer: 59 | Admitting: Family Medicine

## 2014-09-24 DIAGNOSIS — I10 Essential (primary) hypertension: Secondary | ICD-10-CM

## 2014-09-24 NOTE — Progress Notes (Signed)
   Subjective:    Patient ID: [name], female    DOB: 12-29-1965, 49 y.o.   MRN: [id number]  HPI Patient has been deemed a "no-show" for today's scheduled appointment.  Based on chief complaint and my chart review: -No follow-up necessary. -Follow-up urgent. Locating patient immediately -Follow-up necessary.  Contacting patient and urged to schedule visit in 1-3 weeks.   Review of Systems     Objective:   Physical Exam        Assessment & Plan:

## 2014-09-26 ENCOUNTER — Telehealth: Payer: Self-pay | Admitting: Family Medicine

## 2014-09-26 NOTE — Telephone Encounter (Signed)
Re: Missed appt on 7/18 Dr. Madilyn Fireman, I contacted the patient and she has an appt on 7/22.

## 2014-09-28 ENCOUNTER — Encounter: Payer: Self-pay | Admitting: Family Medicine

## 2014-09-28 ENCOUNTER — Ambulatory Visit (INDEPENDENT_AMBULATORY_CARE_PROVIDER_SITE_OTHER): Payer: 59 | Admitting: Family Medicine

## 2014-09-28 VITALS — BP 128/67 | HR 93 | Wt 152.0 lb

## 2014-09-28 DIAGNOSIS — I10 Essential (primary) hypertension: Secondary | ICD-10-CM

## 2014-09-28 DIAGNOSIS — G4733 Obstructive sleep apnea (adult) (pediatric): Secondary | ICD-10-CM | POA: Diagnosis not present

## 2014-09-28 DIAGNOSIS — Z114 Encounter for screening for human immunodeficiency virus [HIV]: Secondary | ICD-10-CM

## 2014-09-28 MED ORDER — LOSARTAN POTASSIUM-HCTZ 50-12.5 MG PO TABS: 1.0000 | ORAL_TABLET | Freq: Every day | ORAL | Status: DC

## 2014-09-28 NOTE — Progress Notes (Signed)
   Subjective:    Patient ID: [name], female    DOB: 1965-11-04, 49 y.o.   MRN: [id number]  HPI Hypertension- Pt denies chest pain, SOB, dizziness, or heart palpitations.  Taking meds as directed w/o problems.  Denies medication side effects.    OSA - she is doing well and using every night.  Says the larger mask has ben working.     Review of Systems     Objective:   Physical Exam  Constitutional: She is oriented to person, place, and time. She appears well-developed and well-nourished.  HENT:  Head: Normocephalic and atraumatic.  Cardiovascular: Normal rate, regular rhythm and normal heart sounds.   Pulmonary/Chest: Effort normal and breath sounds normal.  Neurological: She is alert and oriented to person, place, and time.  Skin: Skin is warm and dry.  Psychiatric: She has a normal mood and affect. Her behavior is normal.          Assessment & Plan:  HTN - well controlled. Continue current regimen. Follow-up in 6 months.  OSA - stable.  Will get download at next OV.

## 2015-03-05 ENCOUNTER — Telehealth: Payer: 59 | Admitting: Family

## 2015-03-05 DIAGNOSIS — J019 Acute sinusitis, unspecified: Secondary | ICD-10-CM

## 2015-03-05 DIAGNOSIS — B9689 Other specified bacterial agents as the cause of diseases classified elsewhere: Secondary | ICD-10-CM

## 2015-03-05 MED ORDER — AMOXICILLIN-POT CLAVULANATE 875-125 MG PO TABS: 1.0000 | ORAL_TABLET | Freq: Two times a day (BID) | ORAL | Status: DC

## 2015-03-05 NOTE — Progress Notes (Signed)

## 2015-03-06 MED ORDER — LEVOFLOXACIN 500 MG PO TABS: 500.0000 mg | ORAL_TABLET | Freq: Every day | ORAL | Status: DC

## 2015-03-06 NOTE — Addendum Note (Signed)
Addended by: Evelina Dun A on: 03/06/2015 05:00 PM   Modules accepted: Orders

## 2015-04-01 ENCOUNTER — Ambulatory Visit (INDEPENDENT_AMBULATORY_CARE_PROVIDER_SITE_OTHER): Payer: 59 | Admitting: Family Medicine

## 2015-04-01 ENCOUNTER — Encounter: Payer: Self-pay | Admitting: Family Medicine

## 2015-04-01 VITALS — BP 127/70 | HR 99 | Wt 152.0 lb

## 2015-04-01 DIAGNOSIS — Z114 Encounter for screening for human immunodeficiency virus [HIV]: Secondary | ICD-10-CM | POA: Diagnosis not present

## 2015-04-01 DIAGNOSIS — J309 Allergic rhinitis, unspecified: Secondary | ICD-10-CM

## 2015-04-01 DIAGNOSIS — Z1159 Encounter for screening for other viral diseases: Secondary | ICD-10-CM | POA: Diagnosis not present

## 2015-04-01 DIAGNOSIS — I10 Essential (primary) hypertension: Secondary | ICD-10-CM

## 2015-04-01 LAB — HIV ANTIBODY (ROUTINE TESTING W REFLEX): HIV: NONREACTIVE

## 2015-04-01 LAB — COMPLETE METABOLIC PANEL WITH GFR
ALT: 21 U/L (ref 6–29)
AST: 22 U/L (ref 10–35)
Albumin: 3.9 g/dL (ref 3.6–5.1)
Alkaline Phosphatase: 75 U/L (ref 33–130)
BUN: 20 mg/dL (ref 7–25)
CALCIUM: 8.8 mg/dL (ref 8.6–10.4)
CO2: 20 mmol/L (ref 20–31)
CREATININE: 0.91 mg/dL (ref 0.50–1.05)
Chloride: 105 mmol/L (ref 98–110)
GFR, EST AFRICAN AMERICAN: 85 mL/min (ref >=60)
GFR, Est Non African American: 74 mL/min (ref >=60)
Glucose, Bld: 97 mg/dL (ref 65–99)
Potassium: 4 mmol/L (ref 3.5–5.3)
Sodium: 139 mmol/L (ref 135–146)
TOTAL PROTEIN: 6.7 g/dL (ref 6.1–8.1)
Total Bilirubin: 0.4 mg/dL (ref 0.2–1.2)

## 2015-04-01 LAB — HEMOGLOBIN A1C
Hgb A1c MFr Bld: 5.5 % (ref <5.7)
Mean Plasma Glucose: 111 mg/dL (ref <117)

## 2015-04-01 LAB — LIPID PANEL
CHOLESTEROL: 149 mg/dL (ref 125–200)
HDL: 30 mg/dL — ABNORMAL LOW (ref >=46)
LDL CALC: 67 mg/dL (ref <130)
TRIGLYCERIDES: 259 mg/dL — AB (ref <150)
Total CHOL/HDL Ratio: 5 Ratio (ref <=5.0)
VLDL: 52 mg/dL — ABNORMAL HIGH (ref <30)

## 2015-04-01 MED ORDER — LOSARTAN POTASSIUM-HCTZ 50-12.5 MG PO TABS: 1.0000 | ORAL_TABLET | Freq: Every day | ORAL | Status: DC

## 2015-04-01 MED FILL — LOSARTAN-HCTZ 50-12.5 MG TA: 50-12.5 | 90 days supply | Qty: 90 | Fill #0

## 2015-04-01 NOTE — Progress Notes (Signed)
   Subjective:    Patient ID: [name], female    DOB: 02/01/1966, 50 y.o.   MRN: [id number]  HPI Hypertension- Pt denies chest pain, SOB, dizziness, or heart palpitations.  Taking meds as directed w/o problems.  Denies medication side effects.    Was treated for sinus infection about 2 weeks ago. Given high dose augmentin but caused severe nausea and then switched to Levaquin. she completed the 5 days. She feels much better overall but still has some congestion. No fever.  No sinus pressure. She does have seasonal allergies.     Review of Systems     Objective:   Physical Exam  Constitutional: She is oriented to person, place, and time. She appears well-developed and well-nourished.  HENT:  Head: Normocephalic and atraumatic.  Right Ear: External ear normal.  Left Ear: External ear normal.  Nose: Nose normal.  Mouth/Throat: Oropharynx is clear and moist.  TMs and canals are clear.   Eyes: Conjunctivae and EOM are normal. Pupils are equal, round, and reactive to light.  Neck: Neck supple. No thyromegaly present.  Cardiovascular: Normal rate, regular rhythm and normal heart sounds.   Pulmonary/Chest: Effort normal and breath sounds normal. She has no wheezes.  Lymphadenopathy:    She has no cervical adenopathy.  Neurological: She is alert and oriented to person, place, and time.  Skin: Skin is warm and dry.  Psychiatric: She has a normal mood and affect.          Assessment & Plan:  HTN - controlled. Continue current regimen. Due for blood work she will go today. Follow-up in 6 months.  AR- will treat with nasal steroid.  Recommend a trial of Nasonex or Flonase over-the-counter. Start with 2 sprays in each nostril daily for 1 week and then decrease down to 1 spray in each nostril daily. If she feels like this is not providing relief or if she is suddenly getting worse then call the office back and we'll treat with an antibiotic.

## 2015-04-11 DIAGNOSIS — G4733 Obstructive sleep apnea (adult) (pediatric): Secondary | ICD-10-CM | POA: Diagnosis not present

## 2015-06-04 DIAGNOSIS — H903 Sensorineural hearing loss, bilateral: Secondary | ICD-10-CM | POA: Diagnosis not present

## 2015-06-07 ENCOUNTER — Encounter: Payer: Self-pay | Admitting: Physician Assistant

## 2015-06-07 ENCOUNTER — Ambulatory Visit (INDEPENDENT_AMBULATORY_CARE_PROVIDER_SITE_OTHER): Payer: 59 | Admitting: Physician Assistant

## 2015-06-07 VITALS — BP 135/86 | HR 113 | Wt 147.0 lb

## 2015-06-07 DIAGNOSIS — K5900 Constipation, unspecified: Secondary | ICD-10-CM | POA: Diagnosis not present

## 2015-06-07 DIAGNOSIS — K921 Melena: Secondary | ICD-10-CM

## 2015-06-07 MED ORDER — HYDROCORTISONE ACETATE 25 MG RE SUPP: 25.0000 mg | Freq: Two times a day (BID) | RECTAL | Status: DC | PRN

## 2015-06-07 NOTE — Progress Notes (Signed)
   Subjective:    Patient ID: [name], female    DOB: 09/02/1965, 50 y.o.   MRN: [id number]  HPI  Pt is a 50 yo female who presents to the clinic with blood in stool for the past few weeks. She noticed blood after wiping a few times last week. Blood is bright red. She admits to some harder stools and some rectal itching. She is still having regular bowel movements.  She has not seen blood in last week. No abdominal pain. No family or personal colon cancer hx. She is due for colonoscopy.      Review of Systems See HPI.    Objective:   Physical Exam  Constitutional: She is oriented to person, place, and time. She appears well-developed and well-nourished.  HENT:  Head: Normocephalic and atraumatic.  Pulmonary/Chest: Effort normal and breath sounds normal.  Abdominal: Soft. Bowel sounds are normal. She exhibits no distension and no mass. There is no tenderness. There is no rebound and no guarding.  Genitourinary:  No external hemorrhoids.  Guaiac positive.  No pain on exam.  No masses palpated rectally.   Neurological: She is alert and oriented to person, place, and time.  Psychiatric: She has a normal mood and affect. Her behavior is normal.          Assessment & Plan:  Blood in stool/constipation- discussed with patient no external hemorrhoids seen. Likely some internal hemorrhoids. Hemoccult positive. Given anusol suppositories. Encouraged colace stool softener with increase in fiber and water to help with constipation. Discussed importance of getting colonoscopy since screening due. HO given.  Follow up if symptoms persist.

## 2015-06-07 NOTE — Patient Instructions (Signed)
Colace to soften stools Schedule colonoscopy  Hemorrhoids Hemorrhoids are swollen veins around the rectum or anus. There are two types of hemorrhoids:   Internal hemorrhoids. These occur in the veins just inside the rectum. They may poke through to the outside and become irritated and painful.  External hemorrhoids. These occur in the veins outside the anus and can be felt as a painful swelling or hard lump near the anus. CAUSES  Pregnancy.   Obesity.   Constipation or diarrhea.   Straining to have a bowel movement.   Sitting for long periods on the toilet.  Heavy lifting or other activity that caused you to strain.  Anal intercourse. SYMPTOMS   Pain.   Anal itching or irritation.   Rectal bleeding.   Fecal leakage.   Anal swelling.   One or more lumps around the anus.  DIAGNOSIS  Your caregiver may be able to diagnose hemorrhoids by visual examination. Other examinations or tests that may be performed include:   Examination of the rectal area with a gloved hand (digital rectal exam).   Examination of anal canal using a small tube (scope).   A blood test if you have lost a significant amount of blood.  A test to look inside the colon (sigmoidoscopy or colonoscopy). TREATMENT Most hemorrhoids can be treated at home. However, if symptoms do not seem to be getting better or if you have a lot of rectal bleeding, your caregiver may perform a procedure to help make the hemorrhoids get smaller or remove them completely. Possible treatments include:   Placing a rubber band at the base of the hemorrhoid to cut off the circulation (rubber band ligation).   Injecting a chemical to shrink the hemorrhoid (sclerotherapy).   Using a tool to burn the hemorrhoid (infrared light therapy).   Surgically removing the hemorrhoid (hemorrhoidectomy).   Stapling the hemorrhoid to block blood flow to the tissue (hemorrhoid stapling).  HOME CARE INSTRUCTIONS   Eat  foods with fiber, such as whole grains, beans, nuts, fruits, and vegetables. Ask your doctor about taking products with added fiber in them (fibersupplements).  Increase fluid intake. Drink enough water and fluids to keep your urine clear or pale yellow.   Exercise regularly.   Go to the bathroom when you have the urge to have a bowel movement. Do not wait.   Avoid straining to have bowel movements.   Keep the anal area dry and clean. Use wet toilet paper or moist towelettes after a bowel movement.   Medicated creams and suppositories may be used or applied as directed.   Only take over-the-counter or prescription medicines as directed by your caregiver.   Take warm sitz baths for 15-20 minutes, 3-4 times a day to ease pain and discomfort.   Place ice packs on the hemorrhoids if they are tender and swollen. Using ice packs between sitz baths may be helpful.   Put ice in a plastic bag.   Place a towel between your skin and the bag.   Leave the ice on for 15-20 minutes, 3-4 times a day.   Do not use a donut-shaped pillow or sit on the toilet for long periods. This increases blood pooling and pain.  SEEK MEDICAL CARE IF:  You have increasing pain and swelling that is not controlled by treatment or medicine.  You have uncontrolled bleeding.  You have difficulty or you are unable to have a bowel movement.  You have pain or inflammation outside the area of the hemorrhoids.  MAKE SURE YOU:  Understand these instructions.  Will watch your condition.  Will get help right away if you are not doing well or get worse.   This information is not intended to replace advice given to you by your health care provider. Make sure you discuss any questions you have with your health care provider.   Document Released: 02/21/2000 Document Revised: 02/10/2012 Document Reviewed: 12/29/2011 Elsevier Interactive Patient Education [employer].

## 2015-06-18 ENCOUNTER — Emergency Department
Admission: EM | Admit: 2015-06-18 | Discharge: 2015-06-18 | Disposition: A | Discharge status: Home / Self Care | Payer: 59 | Source: Home / Self Care | Attending: Family Medicine | Admitting: Family Medicine

## 2015-06-18 ENCOUNTER — Encounter: Payer: Self-pay | Admitting: *Deleted

## 2015-06-18 DIAGNOSIS — K921 Melena: Secondary | ICD-10-CM

## 2015-06-18 DIAGNOSIS — K625 Hemorrhage of anus and rectum: Secondary | ICD-10-CM | POA: Diagnosis not present

## 2015-06-18 NOTE — ED Notes (Signed)
Pt c/o bright red rectal bleeding since her visit 06/07/15. She reports improvement after using suppositories, but reports more blood with BM at 1730 today. Denies abd pain.

## 2015-06-18 NOTE — ED Provider Notes (Signed)
CSN: [id number]     Arrival date & time 06/18/15  1916 History   First MD Initiated Contact with Patient 06/18/15 1927     Chief Complaint  Patient presents with  . Rectal Bleeding   (Consider location/radiation/quality/duration/timing/severity/associated sxs/prior Treatment) HPI  The pt is a 50yo female presenting to [facility] with c/o seeing bright red blood after her bowel movement this evening around 5:30PM.  Pt states it was more than she has seen over the last 1 week.  She was seen on 06/07/15 for same and advised to use Colace and Anusol suppositories.  She has been using suppositories but not the colace. States she has been trying to stay well hydrated. Denies fever, chills, n/v/d. Denies abdominal pain, back pain, or urinary symptoms. Pt knows she needs to schedule a colonoscopy but has not done so yet.   Past Medical History  Diagnosis Date  . Irritable bowel syndrome (IBS)     no current med.  . Hypertension     under control with med., has been on med. x 3 yr.  . Wears hearing aid     bilateral  . Heart murmur     since birth, has had no testing, no problems  . Eczema     legs  . Hallux valgus 06/2012    left  . Hammertoe 06/2012    left second   Past Surgical History  Procedure Laterality Date  . Tympanostomy tube placement  10/10/2009  . Exploratory tympanotomy Left 06/25/2000    with lysis of adhesions  . Tympanoplasty Left 06/25/2000  . Hammer toe surgery Left 06/16/2012    Procedure: LEFT SCARF MODIFIED MCBRIDE AND SECOND Metatarsal WEIL;  Surgeon: Wylene Simmer, MD;  Location: [facility];  Service: Orthopedics;  Laterality: Left;   Family History  Problem Relation Age of Onset  . Alzheimer's disease Mother   . Brain cancer Father     Glioblastoma multiforma.    Social History  Substance Use Topics  . Smoking status: Never Smoker   . Smokeless tobacco: Never Used  . Alcohol Use: Yes     Comment: occasionally   OB History    No data available      Review of Systems  Constitutional: Negative for diaphoresis and fatigue.  Respiratory: Negative for chest tightness and shortness of breath.   Cardiovascular: Negative for chest pain and palpitations.  Gastrointestinal: Positive for constipation ( mild), blood in stool and anal bleeding. Negative for nausea, vomiting, abdominal pain and diarrhea.  Genitourinary: Negative for dysuria, frequency and hematuria.  Musculoskeletal: Negative for myalgias and back pain.  Neurological: Negative for dizziness and light-headedness.    Allergies  Erythromycin; Nitrofurantoin; and Lisinopril  Home Medications   Prior to Admission medications   Medication Sig Start Date End Date Taking? Authorizing Provider  hydrocortisone (ANUSOL-HC) 25 MG suppository Place 1 suppository (25 mg total) rectally 2 (two) times daily as needed for hemorrhoids or itching. 06/07/15   Jade L Breeback, PA-C  losartan-hydrochlorothiazide (HYZAAR) 50-12.5 MG tablet Take 1 tablet by mouth daily. 04/01/15   Hali Marry, MD  norethindrone-ethinyl estradiol (JUNEL FE,GILDESS FE,LOESTRIN FE) 1-20 MG-MCG tablet Take 1 tablet by mouth daily.    Historical Provider, MD   Meds Ordered and Administered this Visit  Medications - No data to display  BP 155/81 mmHg  Pulse 103  Temp(Src) 97.9 F (36.6 C) (Oral)  Resp 16  Ht 5\' 1"  (1.549 m)  Wt 148 lb (67.132 kg)  BMI  27.98 kg/m2  SpO2 100%  LMP 05/22/2015 No data found.   Physical Exam  Constitutional: She is oriented to person, place, and time. She appears well-developed and well-nourished. No distress.  HENT:  Head: Normocephalic and atraumatic.  Mouth/Throat: Oropharynx is clear and moist.  Eyes: Conjunctivae are normal. No scleral icterus.  Neck: Normal range of motion.  Cardiovascular: Normal rate and regular rhythm.   Pulmonary/Chest: Effort normal. No respiratory distress.  Abdominal: Soft. She exhibits no distension. There is no tenderness.   Genitourinary:  Chaperoned exam. Normal rectal exam.   Musculoskeletal: Normal range of motion.  Neurological: She is alert and oriented to person, place, and time.  Skin: Skin is warm and dry. She is not diaphoretic.  Nursing note and vitals reviewed.   ED Course  Procedures (including critical care time)  Labs Review Labs Reviewed - No data to display  Imaging Review No results found.    MDM   1. Bloody stools   2. Rectal bleeding    Pt concerned about blood in stool. Denies abdominal pain or back pain. Pt is hemodynamically stable.   CBC: WNL, no evidence of anemia.  Reassured pt. Advised pt she may have an internal hemorrhoid. Encouraged to start using colace if stools are getting more firm.  Encouraged to f/u with PCP and schedule colonoscopy sooner rather than later. Patient verbalized understanding and agreement with treatment plan.     Noland Fordyce, PA-C 06/18/15 1954

## 2015-06-19 LAB — POCT CBC W AUTO DIFF (K'VILLE URGENT CARE)

## 2015-06-25 DIAGNOSIS — Z01419 Encounter for gynecological examination (general) (routine) without abnormal findings: Secondary | ICD-10-CM | POA: Diagnosis not present

## 2015-06-25 DIAGNOSIS — Z6824 Body mass index (BMI) 24.0-24.9, adult: Secondary | ICD-10-CM | POA: Diagnosis not present

## 2015-06-25 DIAGNOSIS — Z1231 Encounter for screening mammogram for malignant neoplasm of breast: Secondary | ICD-10-CM | POA: Diagnosis not present

## 2015-06-25 LAB — HM MAMMOGRAPHY

## 2015-06-26 LAB — HM PAP SMEAR: HM Pap smear: NEGATIVE

## 2015-07-03 DIAGNOSIS — G4733 Obstructive sleep apnea (adult) (pediatric): Secondary | ICD-10-CM | POA: Diagnosis not present

## 2015-07-11 DIAGNOSIS — Z780 Asymptomatic menopausal state: Secondary | ICD-10-CM | POA: Diagnosis not present

## 2015-07-11 MED FILL — LOSARTAN-HCTZ 50-12.5 MG TA: 50-12.5 | 90 days supply | Qty: 90 | Fill #1

## 2015-07-13 ENCOUNTER — Telehealth: Payer: 59 | Admitting: Physician Assistant

## 2015-07-13 DIAGNOSIS — J00 Acute nasopharyngitis [common cold]: Secondary | ICD-10-CM

## 2015-07-13 DIAGNOSIS — B9689 Other specified bacterial agents as the cause of diseases classified elsewhere: Secondary | ICD-10-CM

## 2015-07-13 DIAGNOSIS — J208 Acute bronchitis due to other specified organisms: Principal | ICD-10-CM

## 2015-07-13 MED ORDER — DOXYCYCLINE HYCLATE 100 MG PO CAPS: 100.0000 mg | ORAL_CAPSULE | Freq: Two times a day (BID) | ORAL | Status: DC

## 2015-07-13 MED ORDER — BENZONATATE 100 MG PO CAPS: 100.0000 mg | ORAL_CAPSULE | Freq: Three times a day (TID) | ORAL | Status: DC | PRN

## 2015-07-13 NOTE — Progress Notes (Signed)
We are sorry that you are not feeling well.  Here is how we plan to help!  Based on what you have shared with me it looks like you have upper respiratory tract inflammation that has resulted in a significant cough.  Inflammation and infection in the upper respiratory tract is commonly called bronchitis and has four common causes:  Allergies, Viral Infections, Acid Reflux and Bacterial Infections.  Allergies, viruses and acid reflux are treated by controlling symptoms or eliminating the cause. An example might be a cough caused by taking certain blood pressure medications. You stop the cough by changing the medication. Another example might be a cough caused by acid reflux. Controlling the reflux helps control the cough.  Based on your presentation I believe you most likely have A cough due to bacteria.  When patients have a fever and a productive cough with a change in color or increased sputum production, we are concerned about bacterial bronchitis.  If left untreated it can progress to pneumonia.  If your symptoms do not improve with your treatment plan it is important that you contact your provider.   I have prescribed Doxycycline 100 mg twice a day for 7 days    In addition you may use A prescription cough medication called Tessalon Perles 100mg . You may take 1-2 capsules every 8 hours as needed for your cough.    HOME CARE . Only take medications as instructed by your medical team. . Complete the entire course of an antibiotic. . Drink plenty of fluids and get plenty of rest. . Avoid close contacts especially the very young and the elderly . Cover your mouth if you cough or cough into your sleeve. . Always remember to wash your hands . A steam or ultrasonic humidifier can help congestion.    GET HELP RIGHT AWAY IF: . You develop worsening fever. . You become short of breath . You cough up blood. . Your symptoms persist after you have completed your treatment plan MAKE SURE YOU    Understand these instructions.  Will watch your condition.  Will get help right away if you are not doing well or get worse.  Your e-visit answers were reviewed by a board certified advanced clinical practitioner to complete your personal care plan.  Depending on the condition, your plan could have included both over the counter or prescription medications. If there is a problem please reply  once you have received a response from your provider. Your safety is important to [place].  If you have drug allergies check your prescription carefully.    You can use MyChart to ask questions about today's visit, request a non-urgent call back, or ask for a work or school excuse for 24 hours related to this e-Visit. If it has been greater than 24 hours you will need to follow up with your provider, or enter a new e-Visit to address those concerns. You will get an e-mail in the next two days asking about your experience.  I hope that your e-visit has been valuable and will speed your recovery. Thank you for using e-visits.

## 2015-09-30 ENCOUNTER — Encounter: Payer: Self-pay | Admitting: Family Medicine

## 2015-09-30 ENCOUNTER — Ambulatory Visit (INDEPENDENT_AMBULATORY_CARE_PROVIDER_SITE_OTHER): Payer: 59 | Admitting: Family Medicine

## 2015-09-30 VITALS — BP 130/68 | HR 94 | Wt 148.0 lb

## 2015-09-30 DIAGNOSIS — I10 Essential (primary) hypertension: Secondary | ICD-10-CM

## 2015-09-30 DIAGNOSIS — E781 Pure hyperglyceridemia: Secondary | ICD-10-CM

## 2015-09-30 DIAGNOSIS — G4733 Obstructive sleep apnea (adult) (pediatric): Secondary | ICD-10-CM

## 2015-09-30 LAB — BASIC METABOLIC PANEL
BUN: 15 mg/dL (ref 7–25)
CHLORIDE: 109 mmol/L (ref 98–110)
CO2: 18 mmol/L — ABNORMAL LOW (ref 20–31)
Calcium: 8.5 mg/dL — ABNORMAL LOW (ref 8.6–10.4)
Creat: 0.97 mg/dL (ref 0.50–1.05)
Glucose, Bld: 95 mg/dL (ref 65–99)
POTASSIUM: 3.6 mmol/L (ref 3.5–5.3)
Sodium: 139 mmol/L (ref 135–146)

## 2015-09-30 NOTE — Progress Notes (Signed)
Subjective:    CC: HTN  HPI:  Hypertension- Pt denies chest pain, SOB, dizziness, or heart palpitations.  Taking meds as directed w/o problems.  Denies medication side effects.    OSA - sleeping well.    Having some problems with ehr teenage daughter.    Sees gyn for her pelvic, mammo and is on birth control.    Past medical history, Surgical history, Family history not pertinant except as noted below, Social history, Allergies, and medications have been entered into the medical record, reviewed, and corrections made.   Review of Systems: No fevers, chills, night sweats, weight loss, chest pain, or shortness of breath.   Objective:    Vitals:   09/30/15 0849  BP: 130/68  Pulse: 94    General: Well Developed, well nourished, and in no acute distress.  Neuro: Alert and oriented x3, extra-ocular muscles intact, sensation grossly intact.  HEENT: Normocephalic, atraumatic  Skin: Warm and dry, no rashes. Cardiac: Regular rate and rhythm, no murmurs rubs or gallops, no lower extremity edema.  Respiratory: Clear to auscultation bilaterally. Not using accessory muscles, speaking in full sentences.   Impression and Recommendations:   HTN - Well controlled. Continue current regimen. Follow up in  6 mo.   OSA - doing well. Sleeping well.    Will call for mammo and pap.

## 2015-10-10 ENCOUNTER — Encounter: Payer: Self-pay | Admitting: Family Medicine

## 2015-10-14 ENCOUNTER — Other Ambulatory Visit: Payer: Self-pay

## 2015-10-14 DIAGNOSIS — I10 Essential (primary) hypertension: Secondary | ICD-10-CM

## 2015-10-14 MED ORDER — LOSARTAN POTASSIUM-HCTZ 50-12.5 MG PO TABS: 1.0000 | ORAL_TABLET | Freq: Every day | ORAL | 1 refills | Status: DC

## 2015-10-14 MED FILL — LOSARTAN-HCTZ 50-12.5 MG TA: 50-12.5 | 90 days supply | Qty: 90 | Fill #0

## 2015-10-14 NOTE — Telephone Encounter (Signed)
Refilled Losartan-HCTZ

## 2015-10-29 DIAGNOSIS — G4731 Primary central sleep apnea: Secondary | ICD-10-CM | POA: Diagnosis not present

## 2015-11-14 DIAGNOSIS — G4733 Obstructive sleep apnea (adult) (pediatric): Secondary | ICD-10-CM | POA: Diagnosis not present

## 2016-01-13 MED FILL — LOSARTAN-HCTZ 50-12.5 MG TA: 50-12.5 | 90 days supply | Qty: 90 | Fill #1

## 2016-02-05 DIAGNOSIS — G4733 Obstructive sleep apnea (adult) (pediatric): Secondary | ICD-10-CM | POA: Diagnosis not present

## 2016-04-01 ENCOUNTER — Encounter: Payer: Self-pay | Admitting: Family Medicine

## 2016-04-01 ENCOUNTER — Other Ambulatory Visit: Payer: Self-pay | Admitting: *Deleted

## 2016-04-01 ENCOUNTER — Ambulatory Visit (INDEPENDENT_AMBULATORY_CARE_PROVIDER_SITE_OTHER): Payer: 59 | Admitting: Family Medicine

## 2016-04-01 VITALS — BP 129/70 | HR 107 | Resp 100 | Ht 61.0 in | Wt 152.0 lb

## 2016-04-01 DIAGNOSIS — E781 Pure hyperglyceridemia: Secondary | ICD-10-CM

## 2016-04-01 DIAGNOSIS — Z23 Encounter for immunization: Secondary | ICD-10-CM

## 2016-04-01 DIAGNOSIS — I10 Essential (primary) hypertension: Secondary | ICD-10-CM | POA: Diagnosis not present

## 2016-04-01 DIAGNOSIS — S161XXA Strain of muscle, fascia and tendon at neck level, initial encounter: Secondary | ICD-10-CM | POA: Diagnosis not present

## 2016-04-01 LAB — LIPID PANEL
CHOL/HDL RATIO: 3.8 ratio (ref <5.0)
CHOLESTEROL: 170 mg/dL (ref <200)
HDL: 45 mg/dL — AB (ref >50)
LDL Cholesterol: 96 mg/dL (ref <100)
Triglycerides: 144 mg/dL (ref <150)
VLDL: 29 mg/dL (ref <30)

## 2016-04-01 LAB — COMPLETE METABOLIC PANEL WITH GFR
ALK PHOS: 69 U/L (ref 33–130)
ALT: 13 U/L (ref 6–29)
AST: 20 U/L (ref 10–35)
Albumin: 4 g/dL (ref 3.6–5.1)
BUN: 16 mg/dL (ref 7–25)
CALCIUM: 9.1 mg/dL (ref 8.6–10.4)
CHLORIDE: 108 mmol/L (ref 98–110)
CO2: 17 mmol/L — ABNORMAL LOW (ref 20–31)
Creat: 0.97 mg/dL (ref 0.50–1.05)
GFR, EST NON AFRICAN AMERICAN: 68 mL/min (ref >=60)
GFR, Est African American: 78 mL/min (ref >=60)
Glucose, Bld: 97 mg/dL (ref 65–99)
POTASSIUM: 3.9 mmol/L (ref 3.5–5.3)
Sodium: 139 mmol/L (ref 135–146)
Total Bilirubin: 0.7 mg/dL (ref 0.2–1.2)
Total Protein: 7.1 g/dL (ref 6.1–8.1)

## 2016-04-01 MED ORDER — LOSARTAN POTASSIUM-HCTZ 50-12.5 MG PO TABS: 1.0000 | ORAL_TABLET | Freq: Every day | ORAL | 1 refills | Status: DC

## 2016-04-01 MED ORDER — CYCLOBENZAPRINE HCL 10 MG PO TABS: 10.0000 mg | ORAL_TABLET | Freq: Every evening | ORAL | 0 refills | Status: DC | PRN

## 2016-04-01 MED FILL — LOSARTAN-HCTZ 50-12.5 MG TA: 50-12.5 | 90 days supply | Qty: 90 | Fill #0

## 2016-04-01 NOTE — Progress Notes (Signed)
Subjective:    CC: HTN  HPI: Hypertension- Pt denies chest pain, SOB, dizziness, or heart palpitations.  Taking meds as directed w/o problems.  Denies medication side effects.    She also has a complaint of left-sided neck pain. It's been bothering her for about a week. She said she tripped down the stairs she did not actually hit the floor. She's not sure if she injured her neck or not but wonders if that could have been the trigger. She says it's difficult to turn her neck to the side. She's been taking Tylenol and ibuprofen and using heat and that has actually been working really well.  Hypertriglyceridemia-not currently on medication.  Past medical history, Surgical history, Family history not pertinant except as noted below, Social history, Allergies, and medications have been entered into the medical record, reviewed, and corrections made.   Review of Systems: No fevers, chills, night sweats, weight loss, chest pain, or shortness of breath.   Objective:    General: Well Developed, well nourished, and in no acute distress.  Neuro: Alert and oriented x3, extra-ocular muscles intact, sensation grossly intact.  HEENT: Normocephalic, atraumatic  Skin: Warm and dry, no rashes. Cardiac: Regular rate and rhythm, no murmurs rubs or gallops, no lower extremity edema.  Respiratory: Clear to auscultation bilaterally. Not using accessory muscles, speaking in full sentences. MSK: Normal flexion and extension of the cervical spine. Decreased rotation to the right and left. Normal side bending right and left   Impression and Recommendations:   HTN - Well controlled. Continue current regimen. Follow up in  6 months.   Left cervical strain-given handout for stretches to do on her own at home. Continue with Tylenol and ibuprofen for pain relief and anti-inflammatory effects. We'll also add a muscle relaxer in the evenings. Warned about potential for sedation and to avoid taking while  driving.  Hypertriglyceridemia-due to recheck lipid panel.   Tdap given today.

## 2016-04-02 NOTE — Progress Notes (Signed)
All labs are normal. 

## 2016-05-08 DIAGNOSIS — H527 Unspecified disorder of refraction: Secondary | ICD-10-CM | POA: Diagnosis not present

## 2016-05-08 DIAGNOSIS — G4733 Obstructive sleep apnea (adult) (pediatric): Secondary | ICD-10-CM | POA: Diagnosis not present

## 2016-07-08 MED FILL — LOSARTAN-HCTZ 50-12.5 MG TA: 50-12.5 | 90 days supply | Qty: 90 | Fill #1

## 2016-07-31 ENCOUNTER — Telehealth: Payer: 59 | Admitting: Family

## 2016-07-31 DIAGNOSIS — H578 Other specified disorders of eye and adnexa: Secondary | ICD-10-CM

## 2016-07-31 DIAGNOSIS — H5789 Other specified disorders of eye and adnexa: Secondary | ICD-10-CM

## 2016-07-31 NOTE — Progress Notes (Signed)
Thank you for the details you put in the comment boxes. Those details really help [place] take better care of you. When it comes to unique situations like this with eyes, we always err on the side of caution with the e-visit program as their may be a foreign body in your eye and this requires an eye exam. It could be something as simple as irritation but it could also be something which could permanently damage your eye.  Based on what you shared with me it looks like you have a serious condition that should be evaluated in a face to face office visit.  NOTE: Even if you have entered your credit card information for this eVisit, you will not be charged.   If you are having a true medical emergency please call 911.  If you need an urgent face to face visit, [facility] has four urgent care centers for your convenience.  If you need care fast and have a high deductible or no insurance consider:   [email or web address]  [phone]  [address] [place], [place] [address] 8 am to 8 pm Monday-Friday 10 am to 4 pm Saturday-Sunday   The following sites will take your  insurance:    . [facility] [place] a Provider at this Location  [address] [place], [place] [address] . 10 am to 8 pm Monday-Friday . 12 pm to 8 pm Saturday-Sunday   . [facility] at [place] a Provider at this Location  [address], [place], [place] [address] . 8 am to 8 pm Monday-Friday . 9 am to 6 pm Saturday . 11 am to 6 pm Sunday   . [facility] at [place] Get Driving Directions  [address] [place], [place] [address] . 8 am to 8 pm Monday-Friday . 8 am to 4 pm Saturday-Sunday   Your e-visit answers were reviewed by a board certified advanced clinical practitioner to complete your personal care  plan.  Thank you for using e-Visits.

## 2016-08-11 DIAGNOSIS — G4733 Obstructive sleep apnea (adult) (pediatric): Secondary | ICD-10-CM | POA: Diagnosis not present

## 2016-09-23 DIAGNOSIS — Z6830 Body mass index (BMI) 30.0-30.9, adult: Secondary | ICD-10-CM | POA: Diagnosis not present

## 2016-09-23 DIAGNOSIS — Z1231 Encounter for screening mammogram for malignant neoplasm of breast: Secondary | ICD-10-CM | POA: Diagnosis not present

## 2016-09-23 DIAGNOSIS — Z01419 Encounter for gynecological examination (general) (routine) without abnormal findings: Secondary | ICD-10-CM | POA: Diagnosis not present

## 2016-09-25 LAB — HM MAMMOGRAPHY

## 2016-09-25 LAB — HM COLONOSCOPY

## 2016-09-29 ENCOUNTER — Encounter: Payer: Self-pay | Admitting: Family Medicine

## 2016-09-29 ENCOUNTER — Ambulatory Visit (INDEPENDENT_AMBULATORY_CARE_PROVIDER_SITE_OTHER): Payer: 59 | Admitting: Family Medicine

## 2016-09-29 VITALS — BP 126/79 | HR 101 | Ht 61.0 in | Wt 156.0 lb

## 2016-09-29 DIAGNOSIS — I10 Essential (primary) hypertension: Secondary | ICD-10-CM | POA: Diagnosis not present

## 2016-09-29 DIAGNOSIS — G4733 Obstructive sleep apnea (adult) (pediatric): Secondary | ICD-10-CM

## 2016-09-29 DIAGNOSIS — R21 Rash and other nonspecific skin eruption: Secondary | ICD-10-CM | POA: Diagnosis not present

## 2016-09-29 LAB — BASIC METABOLIC PANEL WITH GFR
BUN: 18 mg/dL (ref 7–25)
CALCIUM: 9.1 mg/dL (ref 8.6–10.4)
CO2: 20 mmol/L (ref 20–31)
CREATININE: 1.03 mg/dL (ref 0.50–1.05)
Chloride: 106 mmol/L (ref 98–110)
GFR, EST NON AFRICAN AMERICAN: 63 mL/min (ref >=60)
GFR, Est African American: 73 mL/min (ref >=60)
GLUCOSE: 90 mg/dL (ref 65–99)
Potassium: 4.1 mmol/L (ref 3.5–5.3)
Sodium: 139 mmol/L (ref 135–146)

## 2016-09-29 NOTE — Addendum Note (Signed)
Addended by: Darla Lesches T on: 09/29/2016 11:32 AM   Modules accepted: Orders

## 2016-09-29 NOTE — Progress Notes (Signed)
Subjective:    CC: HTN, OSA  HPI: Hypertension- Pt denies chest pain, SOB, dizziness, or heart palpitations.  Taking meds as directed w/o problems.  Denies medication side effects.    OSA - doing well on 4 cm water pressure.  Feels like it is working well.    She also complains of a rash around the middle fingers of both hands. She said she actually quit wearing opening on the left middle finger and the rash is actually gotten better on its own without any type of treatment. She was fearful to put a steroid on in case it was more fungal. She says interestingly her son has a very similar rash.  Her middle finger on her right hand she is still wearing a ring. It's dry and scaly but no itching or irritation. She denies any new soaps or lotions etc. No prior history of metal allergy   Past medical history, Surgical history, Family history not pertinant except as noted below, Social history, Allergies, and medications have been entered into the medical record, reviewed, and corrections made.   Review of Systems: No fevers, chills, night sweats, weight loss, chest pain, or shortness of breath.   Objective:    General: Well Developed, well nourished, and in no acute distress.  Neuro: Alert and oriented x3, extra-ocular muscles intact, sensation grossly intact.  HEENT: Normocephalic, atraumatic  Skin: Warm and dry, no rashes. Cardiac: Regular rate and rhythm, no murmurs rubs or gallops, no lower extremity edema.  Respiratory: Clear to auscultation bilaterally. Not using accessory muscles, speaking in full sentences.   Impression and Recommendations:    HTN - Well controlled. Continue current regimen. Follow up in  6 months.   OSA - Stable. She feels like her CPAP working really well.  Rash around middle finger-most consistent with a contact dermatitis. With did do a skin scraping just to rule out fungal elements. If not improving then recommend a topical steroid. She says she think she still  has some eczema cream at home and she can certainly use that. Also consider metallic allergy.

## 2016-09-30 NOTE — Progress Notes (Signed)
All labs are normal. 

## 2016-10-01 LAB — FUNGAL STAIN

## 2016-10-01 MED ORDER — TRIAMCINOLONE ACETONIDE 0.5 % EX OINT: 1.0000 "application " | TOPICAL_OINTMENT | Freq: Every day | CUTANEOUS | 0 refills | Status: DC | PRN

## 2016-10-01 NOTE — Addendum Note (Signed)
Addended by: Beatrice Lecher D on: 10/01/2016 10:01 PM   Modules accepted: Orders

## 2016-10-02 MED FILL — TRIAMCINOLONE 0.5% OINTMENT: 0.5 | 30 days supply | Qty: 30 | Fill #0

## 2016-10-05 ENCOUNTER — Encounter: Payer: Self-pay | Admitting: Family Medicine

## 2016-10-12 ENCOUNTER — Other Ambulatory Visit: Payer: Self-pay | Admitting: Family Medicine

## 2016-10-12 DIAGNOSIS — I10 Essential (primary) hypertension: Secondary | ICD-10-CM

## 2016-10-12 MED FILL — LOSARTAN-HCTZ 50-12.5 MG TA: 50-12.5 | 90 days supply | Qty: 90 | Fill #0

## 2016-11-13 DIAGNOSIS — G4733 Obstructive sleep apnea (adult) (pediatric): Secondary | ICD-10-CM | POA: Diagnosis not present

## 2016-12-06 ENCOUNTER — Emergency Department
Admission: EM | Admit: 2016-12-06 | Discharge: 2016-12-06 | Disposition: A | Discharge status: Home / Self Care | Payer: 59 | Source: Home / Self Care | Attending: Emergency Medicine | Admitting: Emergency Medicine

## 2016-12-06 ENCOUNTER — Encounter: Payer: Self-pay | Admitting: Emergency Medicine

## 2016-12-06 DIAGNOSIS — Z9889 Other specified postprocedural states: Secondary | ICD-10-CM

## 2016-12-06 DIAGNOSIS — H6591 Unspecified nonsuppurative otitis media, right ear: Secondary | ICD-10-CM

## 2016-12-06 DIAGNOSIS — H9201 Otalgia, right ear: Secondary | ICD-10-CM

## 2016-12-06 HISTORY — DX: Personal history of other diseases of the nervous system and sense organs: Z86.69

## 2016-12-06 MED ORDER — AMOXICILLIN 875 MG PO TABS: 875.0000 mg | ORAL_TABLET | Freq: Two times a day (BID) | ORAL | 0 refills | Status: DC

## 2016-12-06 MED ORDER — CIPROFLOXACIN-HYDROCORTISONE 0.2-1 % OT SUSP: 3.0000 [drp] | Freq: Two times a day (BID) | OTIC | 0 refills | Status: DC

## 2016-12-06 NOTE — ED Triage Notes (Signed)
Reports sense of fullness in right ear with drainage over night for past 2 days. Denies pain. Has strong history of ear infections with current tubes in ears. No OTC today; refusing pain med.

## 2016-12-06 NOTE — ED Provider Notes (Addendum)
[name]    CSN: [id number] Arrival date & time: 12/06/16  1458     History   Chief Complaint Chief Complaint  Patient presents with  . Ear Problem    right  Right ear pain and drainage.  HPI [name] is a 52 y.o. female.   HPI This patient was in her usual state of health until yesterday when she noticed increasing pain with drainage from her right ear. History is remarkable for the patient having bilateral tympanostomy tubes. She has recurrent infections 2-3 times a year. She sees Dr. Janace Hoard regularly for her care. Past Medical History:  Diagnosis Date  . Eczema    legs  . Hallux valgus 06/2012   left  . Hammertoe 06/2012   left second  . Heart murmur    since birth, has had no testing, no problems  . History of frequent ear infections   . Hypertension    under control with med., has been on med. x 3 yr.  . Irritable bowel syndrome (IBS)    no current med.  . Wears hearing aid    bilateral    Patient Active Problem List   Diagnosis Date Noted  . Hypertriglyceridemia 09/30/2015  . OSA (obstructive sleep apnea) 10/25/2013  . Hearing loss 05/28/2011  . Essential hypertension 09/03/2008  . GOITER 07/14/2007    Past Surgical History:  Procedure Laterality Date  . EXPLORATORY TYMPANOTOMY Left 06/25/2000   with lysis of adhesions  . HAMMER TOE SURGERY Left 06/16/2012   Procedure: LEFT SCARF MODIFIED MCBRIDE AND SECOND Metatarsal WEIL;  Surgeon: Wylene Simmer, MD;  Location: [facility];  Service: Orthopedics;  Laterality: Left;  . TYMPANOPLASTY Left 06/25/2000  . TYMPANOSTOMY TUBE PLACEMENT  10/10/2009    OB History    No data available       Home Medications    Prior to Admission medications   Medication Sig Start Date End Date Taking? Authorizing Provider  amoxicillin (AMOXIL) 875 MG tablet Take 1 tablet (875 mg total) by mouth 2 (two) times daily. 12/06/16   Darlyne Russian, MD  ciprofloxacin-hydrocortisone (CIPRO HC)  OTIC suspension Place 3 drops into the right ear 2 (two) times daily. 12/06/16   Darlyne Russian, MD  losartan-hydrochlorothiazide (HYZAAR) 50-12.5 MG tablet TAKE 1 TABLET BY MOUTH DAILY. 10/12/16   Hali Marry, MD  norethindrone-ethinyl estradiol (JUNEL FE,GILDESS FE,LOESTRIN FE) 1-20 MG-MCG tablet Take 1 tablet by mouth daily.    [provider]  triamcinolone ointment (KENALOG) 0.5 % Apply 1 application topically daily as needed. 10/01/16   Hali Marry, MD    Family History Family History  Problem Relation Age of Onset  . Alzheimer's disease Mother   . Brain cancer Father        Glioblastoma multiforma.     Social History Social History  Substance Use Topics  . Smoking status: Never Smoker  . Smokeless tobacco: Never Used  . Alcohol use Yes     Comment: occasionally     Allergies   Erythromycin; Nitrofurantoin; and Lisinopril   Review of Systems Review of Systems  Constitutional: Negative.   HENT: Positive for congestion, ear discharge, ear pain, hearing loss, sinus pressure and sneezing. Negative for mouth sores and voice change.      Physical Exam Triage Vital Signs ED Triage Vitals  Enc Vitals Group     BP      Pulse      Resp  Temp      Temp src      SpO2      Weight      Height      Head Circumference      Peak Flow      Pain Score      Pain Loc      Pain Edu?      Excl. in [place]?    No data found.   Updated Vital Signs BP 130/77 (BP Location: Left Arm)   Pulse 99   Temp 98.9 F (37.2 C) (Oral)   Resp 16   Ht 5\' 1"  (1.549 m)   Wt 155 lb (70.3 kg)   SpO2 96%   BMI 29.29 kg/m   Visual Acuity Right Eye Distance:   Left Eye Distance:   Bilateral Distance:    Right Eye Near:   Left Eye Near:    Bilateral Near:     Physical Exam  Constitutional: She appears well-developed.  HENT:  Head: Normocephalic.  There is a tympanostomy tube on the right and on the left. The drums are somewhat scarred bilaterally. There  is no definite purulence coming from the tympanostomy tubes. The nasopharynx nares are open. The posterior pharynx reveals no redness and no drainage. There is no adenopathy noted. Lungs were clear to both auscultation and percussion.     UC Treatments / Results  Labs (all labs ordered are listed, but only abnormal results are displayed) Labs Reviewed - No data to display  EKG  EKG Interpretation None       Radiology No results found.  Procedures Procedures (including critical care time)  Medications Ordered in UC Medications - No data to display   Initial Impression / Assessment and Plan / UC Course  I have reviewed the triage vital signs and the nursing notes. This patient has bilateral tympanostomy tubes with recurrent infections. She has responded well to amoxicillin and Cipro otic in the past. Will go ahead and treat with these medications. Pertinent labs & imaging results that were available during my care of the patient were reviewed by me and considered in my medical decision making (see chart for details).The nurse will go ahead and call regarding need for birth control backup while taking antibiotics for this cycle.       Final Clinical Impressions(s) / UC Diagnoses   Final diagnoses:  Ear pain, right  Right non-suppurative otitis media  Hx of tympanostomy tubes    New Prescriptions Discharge Medication List as of 12/06/2016  4:18 PM    START taking these medications   Details  amoxicillin (AMOXIL) 875 MG tablet Take 1 tablet (875 mg total) by mouth 2 (two) times daily., Starting Sun 12/06/2016, Print    ciprofloxacin-hydrocortisone (CIPRO HC) OTIC suspension Place 3 drops into the right ear 2 (two) times daily., Starting Sun 12/06/2016, Print         Controlled Substance Prescriptions    Everlene Farrier Loura Back, MD 12/06/16 1621    Darlyne Russian, MD 12/10/16 [phone]

## 2016-12-06 NOTE — Discharge Instructions (Signed)
Please follow-up with Dr. Janace Hoard or Dr. Madilyn Fireman if you continue to have discomfort in your ear.

## 2017-01-11 MED FILL — LOSARTAN POTASSIUM-HCTZ 50-: 50-12.5 | 90 days supply | Qty: 90 | Fill #1

## 2017-02-10 DIAGNOSIS — G4733 Obstructive sleep apnea (adult) (pediatric): Secondary | ICD-10-CM | POA: Diagnosis not present

## 2017-04-01 ENCOUNTER — Ambulatory Visit: Payer: 59 | Admitting: Family Medicine

## 2017-04-07 ENCOUNTER — Ambulatory Visit: Payer: 59 | Admitting: Family Medicine

## 2017-04-07 ENCOUNTER — Encounter: Payer: Self-pay | Admitting: Family Medicine

## 2017-04-07 VITALS — BP 128/82 | HR 97 | Ht 61.0 in | Wt 161.0 lb

## 2017-04-07 DIAGNOSIS — R635 Abnormal weight gain: Secondary | ICD-10-CM

## 2017-04-07 DIAGNOSIS — Z6830 Body mass index (BMI) 30.0-30.9, adult: Secondary | ICD-10-CM

## 2017-04-07 DIAGNOSIS — I10 Essential (primary) hypertension: Secondary | ICD-10-CM

## 2017-04-07 DIAGNOSIS — E781 Pure hyperglyceridemia: Secondary | ICD-10-CM | POA: Diagnosis not present

## 2017-04-07 LAB — COMPLETE METABOLIC PANEL WITH GFR
AG RATIO: 1.4 (calc) (ref 1.0–2.5)
ALT: 16 U/L (ref 6–29)
AST: 21 U/L (ref 10–35)
Albumin: 4.2 g/dL (ref 3.6–5.1)
Alkaline phosphatase (APISO): 83 U/L (ref 33–130)
BUN/Creatinine Ratio: 16 (calc) (ref 6–22)
BUN: 17 mg/dL (ref 7–25)
CALCIUM: 9.3 mg/dL (ref 8.6–10.4)
CO2: 21 mmol/L (ref 20–32)
Chloride: 107 mmol/L (ref 98–110)
Creat: 1.07 mg/dL — ABNORMAL HIGH (ref 0.50–1.05)
GFR, EST NON AFRICAN AMERICAN: 60 mL/min/{1.73_m2} (ref >=60)
GFR, Est African American: 69 mL/min/{1.73_m2} (ref >=60)
Globulin: 3 g/dL (calc) (ref 1.9–3.7)
Glucose, Bld: 101 mg/dL — ABNORMAL HIGH (ref 65–99)
POTASSIUM: 3.6 mmol/L (ref 3.5–5.3)
Sodium: 140 mmol/L (ref 135–146)
Total Bilirubin: 0.8 mg/dL (ref 0.2–1.2)
Total Protein: 7.2 g/dL (ref 6.1–8.1)

## 2017-04-07 LAB — LIPID PANEL W/REFLEX DIRECT LDL
Cholesterol: 180 mg/dL (ref <200)
HDL: 41 mg/dL — AB (ref >50)
LDL Cholesterol (Calc): 111 mg/dL (calc) — ABNORMAL HIGH
NON-HDL CHOLESTEROL (CALC): 139 mg/dL — AB (ref <130)
Total CHOL/HDL Ratio: 4.4 (calc) (ref <5.0)
Triglycerides: 166 mg/dL — ABNORMAL HIGH (ref <150)

## 2017-04-07 MED ORDER — LOSARTAN POTASSIUM-HCTZ 50-12.5 MG PO TABS: 1.0000 | ORAL_TABLET | Freq: Every day | ORAL | 1 refills | Status: DC

## 2017-04-07 MED FILL — LOSARTAN-HCTZ 50-12.5 MG TA: 50-12.5 | 90 days supply | Qty: 90 | Fill #0

## 2017-04-07 NOTE — Progress Notes (Signed)
Subjective:    CC: BP ck  HPI: Hypertension- Pt denies chest pain, SOB, dizziness, or heart palpitations.  Taking meds as directed w/o problems.  Denies medication side effects.    [facility] and reports that she did get that done in 2018.  We will call for report.  She says this year has been really stressful for her and she has gained some weight.  Her father was diagnosed with brain cancer and unfortunately it recurred.  He is been in and out of the hospital for several months now.  He developed an abscess in his brain and then recently fell.  She says she really plans on getting back on track and starting to exercise again and eat healthy.  In fact this past week she started adding protein shakes to her morning meal.  Past medical history, Surgical history, Family history not pertinant except as noted below, Social history, Allergies, and medications have been entered into the medical record, reviewed, and corrections made.   Review of Systems: No fevers, chills, night sweats, weight loss, chest pain, or shortness of breath.   Objective:    General: Well Developed, well nourished, and in no acute distress.  Neuro: Alert and oriented x3, extra-ocular muscles intact, sensation grossly intact.  HEENT: Normocephalic, atraumatic  Skin: Warm and dry, no rashes. Cardiac: Regular rate and rhythm, no murmurs rubs or gallops, no lower extremity edema.  Respiratory: Clear to auscultation bilaterally. Not using accessory muscles, speaking in full sentences.   Impression and Recommendations:    HTN - Well controlled. Continue current regimen. Follow up in  6 months. Due for CMP and lipids.  Meds refilled.    Will call for latest mammogram report.  Discussed colon cancer screening again.  She says she points to the C[employer] in fact Artie has the kit at home.  Just reminded her to try to complete it  Abnormal weight gain/BMI 30-we discussed some  strategies around regular exercise and even a quick workout for 10 or 15 minutes can be helpful if she is not able to go to the gym.  We also discussed really eating a low-carb diet and try to increase protein.  I think she will be successful.  I would like to see her get back down to about 140 pounds.

## 2017-04-09 LAB — HM MAMMOGRAPHY

## 2017-04-23 ENCOUNTER — Encounter: Payer: Self-pay | Admitting: Family Medicine

## 2017-04-26 ENCOUNTER — Telehealth: Payer: 59 | Admitting: Family

## 2017-04-26 DIAGNOSIS — J029 Acute pharyngitis, unspecified: Secondary | ICD-10-CM

## 2017-04-26 MED ORDER — PREDNISONE 5 MG PO TABS: 5.0000 mg | ORAL_TABLET | ORAL | 0 refills | Status: DC

## 2017-04-26 MED ORDER — BENZONATATE 100 MG PO CAPS: 100.0000 mg | ORAL_CAPSULE | Freq: Three times a day (TID) | ORAL | 0 refills | Status: DC | PRN

## 2017-04-26 NOTE — Progress Notes (Signed)
Thank you for the details you included in the comment boxes. Those details are very helpful in determining the best course of treatment for you and help [place] to provide the best care. The treatment below will treat your sinus and cough.  We are sorry that you are not feeling well.  Here is how we plan to help!  Based on your presentation I believe you most likely have A cough due to a virus.  This is called viral bronchitis and is best treated by rest, plenty of fluids and control of the cough.  You may use Ibuprofen or Tylenol as directed to help your symptoms.     In addition you may use A non-prescription cough medication called Mucinex DM: take 2 tablets every 12 hours. and A prescription cough medication called Tessalon Perles 100mg . You may take 1-2 capsules every 8 hours as needed for your cough.  Sterapred 5 mg dosepak  From your responses in the eVisit questionnaire you describe inflammation in the upper respiratory tract which is causing a significant cough.  This is commonly called Bronchitis and has four common causes:    Allergies  Viral Infections  Acid Reflux  Bacterial Infection Allergies, viruses and acid reflux are treated by controlling symptoms or eliminating the cause. An example might be a cough caused by taking certain blood pressure medications. You stop the cough by changing the medication. Another example might be a cough caused by acid reflux. Controlling the reflux helps control the cough.  USE OF BRONCHODILATOR ("RESCUE") INHALERS: There is a risk from using your bronchodilator too frequently.  The risk is that over-reliance on a medication which only relaxes the muscles surrounding the breathing tubes can reduce the effectiveness of medications prescribed to reduce swelling and congestion of the tubes themselves.  Although you feel brief relief from the bronchodilator inhaler, your asthma may actually be worsening with the tubes becoming more swollen and filled with  mucus.  This can delay other crucial treatments, such as oral steroid medications. If you need to use a bronchodilator inhaler daily, several times per day, you should discuss this with your provider.  There are probably better treatments that could be used to keep your asthma under control.     HOME CARE . Only take medications as instructed by your medical team. . Complete the entire course of an antibiotic. . Drink plenty of fluids and get plenty of rest. . Avoid close contacts especially the very young and the elderly . Cover your mouth if you cough or cough into your sleeve. . Always remember to wash your hands . A steam or ultrasonic humidifier can help congestion.   GET HELP RIGHT AWAY IF: . You develop worsening fever. . You become short of breath . You cough up blood. . Your symptoms persist after you have completed your treatment plan MAKE SURE YOU   Understand these instructions.  Will watch your condition.  Will get help right away if you are not doing well or get worse.  Your e-visit answers were reviewed by a board certified advanced clinical practitioner to complete your personal care plan.  Depending on the condition, your plan could have included both over the counter or prescription medications. If there is a problem please reply  once you have received a response from your provider. Your safety is important to [place].  If you have drug allergies check your prescription carefully.    You can use MyChart to ask questions about today's visit, request  a non-urgent call back, or ask for a work or school excuse for 24 hours related to this e-Visit. If it has been greater than 24 hours you will need to follow up with your provider, or enter a new e-Visit to address those concerns. You will get an e-mail in the next two days asking about your experience.  I hope that your e-visit has been valuable and will speed your recovery. Thank you for using e-visits.

## 2017-05-18 DIAGNOSIS — G4733 Obstructive sleep apnea (adult) (pediatric): Secondary | ICD-10-CM | POA: Diagnosis not present

## 2017-07-15 MED FILL — LOSARTAN POTASSIUM-HCTZ 50-: 50-12.5 | 90 days supply | Qty: 90 | Fill #1

## 2017-07-19 ENCOUNTER — Encounter: Payer: Self-pay | Admitting: Family Medicine

## 2017-07-25 MED ORDER — ESCITALOPRAM OXALATE 10 MG PO TABS: 10.0000 mg | ORAL_TABLET | Freq: Every day | ORAL | 0 refills | Status: DC

## 2017-07-25 NOTE — Telephone Encounter (Signed)
Please call [facility] and cancel Rx for Lexparo.

## 2017-08-26 ENCOUNTER — Encounter: Payer: Self-pay | Admitting: Family Medicine

## 2017-08-26 MED ORDER — FLUOXETINE HCL 10 MG PO CAPS: 10.0000 mg | ORAL_CAPSULE | Freq: Every day | ORAL | 0 refills | Status: DC

## 2017-08-26 NOTE — Telephone Encounter (Signed)
Rx cancelled from [facility]. Sent to [facility]. Pt advised.

## 2017-09-10 ENCOUNTER — Telehealth: Payer: Self-pay | Admitting: Family Medicine

## 2017-09-10 NOTE — Telephone Encounter (Signed)
Called pt- she has been out of town. She has not started new medication yet due to being on vacation, but will start today. She has a follow up scheduled for August 6th and will call or send web portal msg if any needs or concerns arise prior to her follow up appt.

## 2017-09-10 NOTE — Telephone Encounter (Signed)
Please call pt:  This message is to inform you that the patient has not yet read the following message. (Notification date: September 09, 2017)  RE: Non-Urgent Medical Question   From Hali Marry, MD To [name] 08/26/2017 11:35 AM  OK, I am so sorry it made you ill. Let's try fluoxetine. Will start really low with 10 mg and work up from there. Please just try to follow-up with me in the office in about 3 to 4 weeks basically before you are due for refill so that we can check in and make sure that you are doing okay and adjust her dose at that time if needed.   Dr. Jerilynn Mages   Previous Messages    ----- Message -----   From: [name]   Sent: 08/26/2017 10:13 AM EDT    To: Beatrice Lecher, MD  Subject: Non-Urgent Medical Question   Hi Dr. Madilyn Fireman,  I started taking the Lexapro you prescribed for me, but I had to stop taking it after about 4 or 5 days. I started having some bad side effects. it was making me really nauseous and made me feel really bad. Is there another med I can try?  Thanks so much!   [place]   MyChart User Last Read On  [name] Not Read

## 2017-09-27 DIAGNOSIS — G4733 Obstructive sleep apnea (adult) (pediatric): Secondary | ICD-10-CM | POA: Diagnosis not present

## 2017-10-02 ENCOUNTER — Telehealth (INDEPENDENT_AMBULATORY_CARE_PROVIDER_SITE_OTHER): Payer: Self-pay | Admitting: Nurse Practitioner

## 2017-10-02 DIAGNOSIS — J0100 Acute maxillary sinusitis, unspecified: Secondary | ICD-10-CM

## 2017-10-02 MED ORDER — AMOXICILLIN-POT CLAVULANATE 875-125 MG PO TABS: 1.0000 | ORAL_TABLET | Freq: Two times a day (BID) | ORAL | 0 refills | Status: DC

## 2017-10-02 NOTE — Progress Notes (Signed)

## 2017-10-05 ENCOUNTER — Ambulatory Visit: Payer: 59 | Admitting: Family Medicine

## 2017-10-05 DIAGNOSIS — R319 Hematuria, unspecified: Secondary | ICD-10-CM | POA: Diagnosis not present

## 2017-10-05 DIAGNOSIS — Z1231 Encounter for screening mammogram for malignant neoplasm of breast: Secondary | ICD-10-CM | POA: Diagnosis not present

## 2017-10-05 DIAGNOSIS — Z6832 Body mass index (BMI) 32.0-32.9, adult: Secondary | ICD-10-CM | POA: Diagnosis not present

## 2017-10-05 DIAGNOSIS — Z01419 Encounter for gynecological examination (general) (routine) without abnormal findings: Secondary | ICD-10-CM | POA: Diagnosis not present

## 2017-10-07 LAB — HM MAMMOGRAPHY

## 2017-10-12 ENCOUNTER — Ambulatory Visit (INDEPENDENT_AMBULATORY_CARE_PROVIDER_SITE_OTHER): Payer: 59 | Admitting: Family Medicine

## 2017-10-12 ENCOUNTER — Encounter: Payer: Self-pay | Admitting: Family Medicine

## 2017-10-12 VITALS — BP 130/66 | HR 80 | Temp 97.8°F | Ht 61.0 in | Wt 165.9 lb

## 2017-10-12 DIAGNOSIS — F439 Reaction to severe stress, unspecified: Secondary | ICD-10-CM

## 2017-10-12 DIAGNOSIS — I10 Essential (primary) hypertension: Secondary | ICD-10-CM

## 2017-10-12 LAB — BASIC METABOLIC PANEL WITH GFR
BUN/Creatinine Ratio: 13 (calc) (ref 6–22)
BUN: 14 mg/dL (ref 7–25)
CALCIUM: 8.7 mg/dL (ref 8.6–10.4)
CO2: 21 mmol/L (ref 20–32)
Chloride: 107 mmol/L (ref 98–110)
Creat: 1.07 mg/dL — ABNORMAL HIGH (ref 0.50–1.05)
GFR, Est African American: 69 mL/min/{1.73_m2} (ref >=60)
GFR, Est Non African American: 60 mL/min/{1.73_m2} (ref >=60)
Glucose, Bld: 102 mg/dL — ABNORMAL HIGH (ref 65–99)
POTASSIUM: 4 mmol/L (ref 3.5–5.3)
Sodium: 138 mmol/L (ref 135–146)

## 2017-10-12 NOTE — Progress Notes (Signed)
   Subjective:    Patient ID: [name], adult    DOB: [birth year], 52 y.o.   MRN: [id number]  HPI Hypertension- Pt denies chest pain, SOB, dizziness, or heart palpitations.  Taking meds as directed w/o problems.  Denies medication side effects.    She has been under a lot of stress this year.  Her father finally passed away in the spring from a glioblastoma that he had been battling for about 4 years.  Her husband has been having hip problems and is actually having right hip replacement in a week.  And her son and girlfriend are pregnant and getting ready to have a grandchild.  We decided to start her on Lexapro but unfortunately she did not tolerate it well so we switched her to fluoxetine and decided to start really low.  She is just taking 10 mg and says she has not expressed any side effects with this 1 is actually tolerating it really well and feels like it is helpful.  She says that her husband commented that he she has been much less irritable and snappy.  Review of Systems     Objective:   Physical Exam  Constitutional: She is oriented to person, place, and time. She appears well-developed and well-nourished.  HENT:  Head: Normocephalic and atraumatic.  Cardiovascular: Normal rate, regular rhythm and normal heart sounds.  Pulmonary/Chest: Effort normal and breath sounds normal.  Neurological: She is alert and oriented to person, place, and time.  Skin: Skin is warm and dry.  Psychiatric: She has a normal mood and affect. Her behavior is normal.          Assessment & Plan:  HTN - Well controlled. Continue current regimen. Follow up in  6 months.    Stress -well on current regimen will prefer to stay at the 10 mg fluoxetine.  She is tolerated it well without any significant side effects and does feel like it has been helpful.  Will continue on current regimen and I will see her back in 6 months.  She can always reach out through my chart if she would like to adjust her  dose.  PHQ 9 score of 0 and gad 7 score of 0.  Also discussed shingles vaccine.  Handout provided to check with insurance on coverage.

## 2017-10-22 ENCOUNTER — Other Ambulatory Visit: Payer: Self-pay | Admitting: Family Medicine

## 2017-10-22 DIAGNOSIS — I10 Essential (primary) hypertension: Secondary | ICD-10-CM

## 2017-10-25 MED FILL — LOSARTAN-HCTZ 50-12.5 MG TA: 50-12.5 | 90 days supply | Qty: 90 | Fill #0

## 2017-11-25 ENCOUNTER — Encounter: Payer: Self-pay | Admitting: Family Medicine

## 2017-12-22 ENCOUNTER — Other Ambulatory Visit: Payer: Self-pay | Admitting: Family Medicine

## 2018-01-07 MED FILL — FLUoxetine HCL 10 MG TABS: 10 | 90 days supply | Qty: 180 | Fill #0

## 2018-01-21 MED FILL — LOSARTAN-HCTZ 50-12.5 MG TA: 50-12.5 | 90 days supply | Qty: 90 | Fill #1

## 2018-02-15 DIAGNOSIS — G4733 Obstructive sleep apnea (adult) (pediatric): Secondary | ICD-10-CM | POA: Diagnosis not present

## 2018-02-28 ENCOUNTER — Telehealth: Payer: Self-pay | Admitting: *Deleted

## 2018-02-28 NOTE — Telephone Encounter (Signed)
Form completed,faxed,confirmation received and scanned into patient's chart..Harim Bi Lynetta, CMA  

## 2018-04-14 ENCOUNTER — Ambulatory Visit (INDEPENDENT_AMBULATORY_CARE_PROVIDER_SITE_OTHER): Payer: Commercial Managed Care - PPO | Admitting: Family Medicine

## 2018-04-14 ENCOUNTER — Encounter: Payer: Self-pay | Admitting: Family Medicine

## 2018-04-14 VITALS — BP 122/68 | HR 99 | Ht 61.0 in | Wt 160.0 lb

## 2018-04-14 DIAGNOSIS — F439 Reaction to severe stress, unspecified: Secondary | ICD-10-CM

## 2018-04-14 DIAGNOSIS — I10 Essential (primary) hypertension: Secondary | ICD-10-CM

## 2018-04-14 DIAGNOSIS — E781 Pure hyperglyceridemia: Secondary | ICD-10-CM | POA: Diagnosis not present

## 2018-04-14 DIAGNOSIS — G4733 Obstructive sleep apnea (adult) (pediatric): Secondary | ICD-10-CM

## 2018-04-14 LAB — LIPID PANEL
CHOLESTEROL: 167 mg/dL (ref <200)
HDL: 42 mg/dL — AB (ref >=50)
LDL Cholesterol (Calc): 100 mg/dL (calc) — ABNORMAL HIGH
Non-HDL Cholesterol (Calc): 125 mg/dL (calc) (ref <130)
TRIGLYCERIDES: 148 mg/dL (ref <150)
Total CHOL/HDL Ratio: 4 (calc) (ref <5.0)

## 2018-04-14 LAB — COMPLETE METABOLIC PANEL WITH GFR
AG RATIO: 1.6 (calc) (ref 1.0–2.5)
ALBUMIN MSPROF: 4.2 g/dL (ref 3.6–5.1)
ALT: 15 U/L (ref 6–29)
AST: 19 U/L (ref 10–35)
Alkaline phosphatase (APISO): 89 U/L (ref 37–153)
BUN: 19 mg/dL (ref 7–25)
CHLORIDE: 106 mmol/L (ref 98–110)
CO2: 19 mmol/L — ABNORMAL LOW (ref 20–32)
Calcium: 9.2 mg/dL (ref 8.6–10.4)
Creat: 1.03 mg/dL (ref 0.50–1.05)
GFR, EST AFRICAN AMERICAN: 72 mL/min/{1.73_m2} (ref >=60)
GFR, EST NON AFRICAN AMERICAN: 62 mL/min/{1.73_m2} (ref >=60)
GLOBULIN: 2.7 g/dL (ref 1.9–3.7)
Glucose, Bld: 90 mg/dL (ref 65–99)
POTASSIUM: 4 mmol/L (ref 3.5–5.3)
Sodium: 136 mmol/L (ref 135–146)
Total Bilirubin: 0.6 mg/dL (ref 0.2–1.2)
Total Protein: 6.9 g/dL (ref 6.1–8.1)

## 2018-04-14 MED ORDER — LOSARTAN POTASSIUM-HCTZ 50-12.5 MG PO TABS: 1.0000 | ORAL_TABLET | Freq: Every day | ORAL | 1 refills | Status: DC

## 2018-04-14 MED FILL — LOSARTAN POTASSIUM 50 MG TA: 50 | 90 days supply | Qty: 90 | Fill #0

## 2018-04-14 MED FILL — HYDROCHLOROTHIAZIDE 12.5 MG: 12.5 | 90 days supply | Qty: 90 | Fill #0

## 2018-04-14 NOTE — Progress Notes (Signed)
Subjective:    CC: BP  HPI:  Hypertension- Pt denies chest pain, SOB, dizziness, or heart palpitations.  Taking meds as directed w/o problems.  Denies medication side effects.    F/U on stress -over the summer we put her on fluoxetine.  She has lost her father in the spring and had a lot of immediate family stressors going on around that time.  When I last saw her she was doing well on the medication.  OSA - using her CPAP and oing well with it. Supplies through [facility]   Past medical history, Surgical history, Family history not pertinant except as noted below, Social history, Allergies, and medications have been entered into the medical record, reviewed, and corrections made.   Review of Systems: No fevers, chills, night sweats, weight loss, chest pain, or shortness of breath.   Objective:    General: Well Developed, well nourished, and in no acute distress.  Neuro: Alert and oriented x3, extra-ocular muscles intact, sensation grossly intact.  HEENT: Normocephalic, atraumatic  Skin: Warm and dry, no rashes. Cardiac: Regular rate and rhythm, no murmurs rubs or gallops, no lower extremity edema. No carotid bruits.   Respiratory: Clear to auscultation bilaterally. Not using accessory muscles, speaking in full sentences.   Impression and Recommendations:    HTN - due for labs.  Regulated her on losing 6 pounds.  She says she is really cut back on sweets and carbs.Well controlled. Continue current regimen. Follow up in  6 months.    Hyperlipidemia - due to recheck lipids.    Acute stress -now she wants to continue with the fluoxetine for at least through the spring especially with the anniversary of her father passing.  We will make sure she has plenty of refills.  Obstructive sleep apnea-she gets her supplies currently through [facility] and is doing well on it.

## 2018-04-22 MED FILL — FLUoxetine HCL 10 MG TABS: 10 | 90 days supply | Qty: 180 | Fill #1

## 2018-04-28 ENCOUNTER — Encounter: Payer: Self-pay | Admitting: Family Medicine

## 2018-05-17 DIAGNOSIS — G4733 Obstructive sleep apnea (adult) (pediatric): Secondary | ICD-10-CM | POA: Diagnosis not present

## 2018-07-20 MED FILL — HYDROCHLOROTHIAZIDE 12.5 MG: 12.5 | 90 days supply | Qty: 90 | Fill #1

## 2018-07-20 MED FILL — LOSARTAN POTASSIUM 50 MG TA: 50 | 90 days supply | Qty: 90 | Fill #1

## 2018-07-24 ENCOUNTER — Other Ambulatory Visit: Payer: Self-pay | Admitting: *Deleted

## 2018-07-24 MED ORDER — FLUOXETINE HCL 10 MG PO CAPS: 20.0000 mg | ORAL_CAPSULE | Freq: Every day | ORAL | 1 refills | Status: DC

## 2018-07-25 MED FILL — FLUoxetine HCL 10 MG CAPS: 10 | 90 days supply | Qty: 180 | Fill #0

## 2018-08-17 DIAGNOSIS — G4733 Obstructive sleep apnea (adult) (pediatric): Secondary | ICD-10-CM | POA: Diagnosis not present

## 2018-10-13 ENCOUNTER — Ambulatory Visit: Payer: Commercial Managed Care - PPO | Admitting: Family Medicine

## 2018-10-18 ENCOUNTER — Telehealth (INDEPENDENT_AMBULATORY_CARE_PROVIDER_SITE_OTHER): Payer: 59 | Admitting: Family Medicine

## 2018-10-18 ENCOUNTER — Encounter: Payer: Self-pay | Admitting: Family Medicine

## 2018-10-18 VITALS — BP 110/75 | HR 85 | Ht 61.0 in

## 2018-10-18 DIAGNOSIS — F439 Reaction to severe stress, unspecified: Secondary | ICD-10-CM

## 2018-10-18 DIAGNOSIS — I10 Essential (primary) hypertension: Secondary | ICD-10-CM

## 2018-10-18 DIAGNOSIS — R42 Dizziness and giddiness: Secondary | ICD-10-CM

## 2018-10-18 MED ORDER — LOSARTAN POTASSIUM-HCTZ 50-12.5 MG PO TABS: 1.0000 | ORAL_TABLET | Freq: Every day | ORAL | 1 refills | Status: DC

## 2018-10-18 NOTE — Progress Notes (Signed)
Virtual Visit via Video Note  I connected with [name] on 10/18/18 at  3:40 PM EDT by a video enabled telemedicine application and verified that I am speaking with the correct person using two identifiers.   I discussed the limitations of evaluation and management by telemedicine and the availability of in person appointments. The patient expressed understanding and agreed to proceed.    Established Patient Office Visit  Subjective:  Patient ID: [name], adult    DOB: 05/24/1965  Age: 53 y.o. MRN: [id number]  CC:  Chief Complaint  Patient presents with  . Hypertension    HPI [name] presents for   Hypertension- Pt denies chest pain, SOB, dizziness, or heart palpitations.  Taking meds as directed w/o problems.  Denies medication side effects.    She has been feeling dizzy and feels like ears are full again. She says she plans on trying some sudafed.   She did have a question.  Her daughter works at [employer] and a Mudlogger there tested positive for [employer].  Her daughter was around this coworker the day before but not in close contact and they were both wearing masks.  She wants to know what her potential risk would be.   Past Medical History:  Diagnosis Date  . Eczema    legs  . Hallux valgus 06/2012   left  . Hammertoe 06/2012   left second  . Heart murmur    since birth, has had no testing, no problems  . History of frequent ear infections   . Hypertension    under control with med., has been on med. x 3 yr.  . Irritable bowel syndrome (IBS)    no current med.  . Wears hearing aid    bilateral    Past Surgical History:  Procedure Laterality Date  . EXPLORATORY TYMPANOTOMY Left 06/25/2000   with lysis of adhesions  . HAMMER TOE SURGERY Left 06/16/2012   Procedure: LEFT SCARF MODIFIED MCBRIDE AND SECOND Metatarsal WEIL;  Surgeon: Wylene Simmer, MD;  Location: [facility];  Service: Orthopedics;  Laterality: Left;   . TYMPANOPLASTY Left 06/25/2000  . TYMPANOSTOMY TUBE PLACEMENT  10/10/2009    Family History  Problem Relation Age of Onset  . Alzheimer's disease Mother   . Brain cancer Father        Glioblastoma multiforma.     Social History   Socioeconomic History  . Marital status: Married    Spouse name: Not on file  . Number of children: 2   . Years of education: Not on file  . Highest education level: Not on file  Occupational History  . Occupation: works from home   Social Needs  . Financial resource strain: Not on file  . Food insecurity    Worry: Not on file    Inability: Not on file  . Transportation needs    Medical: Not on file    Non-medical: Not on file  Tobacco Use  . Smoking status: Never Smoker  . Smokeless tobacco: Never Used  Substance and Sexual Activity  . Alcohol use: Yes    Comment: occasionally  . Drug use: No  . Sexual activity: Not on file  Lifestyle  . Physical activity    Days per week: Not on file    Minutes per session: Not on file  . Stress: Not on file  Relationships  . Social connections    Talks on phone: Not on file  Gets together: Not on file    Attends religious service: Not on file    Active member of club or organization: Not on file    Attends meetings of clubs or organizations: Not on file    Relationship status: Not on file  . Intimate partner violence    Fear of current or ex partner: Not on file    Emotionally abused: Not on file    Physically abused: Not on file    Forced sexual activity: Not on file  Other Topics Concern  . Not on file  Social History Narrative   No regular exercise.      Outpatient Medications Prior to Visit  Medication Sig Dispense Refill  . FLUoxetine (PROZAC) 10 MG capsule Take 2 capsules (20 mg total) by mouth daily. 180 capsule 1  . norethindrone-ethinyl estradiol (JUNEL FE,GILDESS FE,LOESTRIN FE) 1-20 MG-MCG tablet Take 1 tablet by mouth daily.    Marland Kitchen losartan-hydrochlorothiazide (HYZAAR) 50-12.5  MG tablet Take 1 tablet by mouth daily. 90 tablet 1  . triamcinolone ointment (KENALOG) 0.5 % Apply 1 application topically daily as needed. 30 g 0   No facility-administered medications prior to visit.     Allergies  Allergen Reactions  . Erythromycin Nausea And Vomiting    FACIAL NUMBNESS  . Nitrofurantoin Other (See Comments)    GI UPSET  . Lexapro [Escitalopram Oxalate] Nausea Only  . Lisinopril Cough    ROS Review of Systems    Objective:    Physical Exam  BP 110/75   [name] 84   Ht 5\' 1"  (1.549 m)   BMI 30.23 kg/m  Wt Readings from Last 3 Encounters:  04/14/18 160 lb (72.6 kg)  10/12/17 165 lb 14.4 oz (75.3 kg)  04/07/17 161 lb (73 kg)     Health Maintenance Due  Topic Date Due  . PAP SMEAR-Modifier  06/26/2018    There are no preventive care reminders to display for this patient.  Lab Results  Component Value Date   TSH 2.238 07/27/2007   Lab Results  Component Value Date   HGB 13.8 06/16/2012   Lab Results  Component Value Date   NA 136 04/14/2018   K 4.0 04/14/2018   CO2 19 (L) 04/14/2018   GLUCOSE 90 04/14/2018   BUN 19 04/14/2018   CREATININE 1.03 04/14/2018   BILITOT 0.6 04/14/2018   ALKPHOS 69 04/01/2016   AST 19 04/14/2018   ALT 15 04/14/2018   PROT 6.9 04/14/2018   ALBUMIN 4.0 04/01/2016   CALCIUM 9.2 04/14/2018   Lab Results  Component Value Date   CHOL 167 04/14/2018   Lab Results  Component Value Date   HDL 42 (L) 04/14/2018   Lab Results  Component Value Date   LDLCALC 100 (H) 04/14/2018   Lab Results  Component Value Date   TRIG 148 04/14/2018   Lab Results  Component Value Date   CHOLHDL 4.0 04/14/2018   Lab Results  Component Value Date   HGBA1C 5.5 04/01/2015      Assessment & Plan:   Problem List Items Addressed This Visit      Cardiovascular and Mediastinum   Essential hypertension    Repeat home BP looks great!  F/U in 6 months. Labs are up to date.       Relevant Medications    losartan-hydrochlorothiazide (HYZAAR) 50-12.5 MG tablet    Other Visit Diagnoses    Stress    -  Primary   Dizziness  Stress -overall doing well.  Plan to dec prozac to 10mg  daily for one month and then every other day for 10 days and then stop the medication.  Can restart if needed.     We did discuss that if her daughter had very minimal contact and was wearing a mask as well as the other person that exposure is actually pretty minimal.  I do think she should be very cautious over the next week to monitor for symptoms.  And I do think she should limit visitation with family during that timeframe just to make sure that she is doing okay and if at any point she develops any symptoms, which I reviewed with her today, then she should go get tested.  Dizziness-she really feels like it is her sinuses and allergies so plans on trying some Sudafed but if it is not helpful or she develops any ear pain pressure or drainage to please call back.  Meds ordered this encounter  Medications  . losartan-hydrochlorothiazide (HYZAAR) 50-12.5 MG tablet    Sig: Take 1 tablet by mouth daily.    Dispense:  90 tablet    Refill:  1    Follow-up: Return in about 6 months (around 04/20/2019) for BP check up .        I discussed the assessment and treatment plan with the patient. The patient was provided an opportunity to ask questions and all were answered. The patient agreed with the plan and demonstrated an understanding of the instructions.   The patient was advised to call back or seek an in-person evaluation if the symptoms worsen or if the condition fails to improve as anticipated.   Beatrice Lecher, MD

## 2018-10-18 NOTE — Assessment & Plan Note (Signed)
Repeat home BP looks great!  F/U in 6 months. Labs are up to date.

## 2018-10-21 ENCOUNTER — Telehealth: Payer: 59 | Admitting: Nurse Practitioner

## 2018-10-21 DIAGNOSIS — J0100 Acute maxillary sinusitis, unspecified: Secondary | ICD-10-CM

## 2018-10-21 MED ORDER — AMOXICILLIN-POT CLAVULANATE 875-125 MG PO TABS: 1.0000 | ORAL_TABLET | Freq: Two times a day (BID) | ORAL | 0 refills | Status: DC

## 2018-10-21 NOTE — Progress Notes (Signed)

## 2018-10-27 MED FILL — FLUoxetine HCL 10 MG CAPS: 10 | 90 days supply | Qty: 180 | Fill #1

## 2018-10-27 MED FILL — LOSARTAN-HCTZ 50-12.5 MG TA: 50-12.5 | 90 days supply | Qty: 90 | Fill #0

## 2018-11-02 ENCOUNTER — Telehealth: Payer: 59 | Admitting: Family

## 2018-11-02 DIAGNOSIS — B3731 Acute candidiasis of vulva and vagina: Secondary | ICD-10-CM

## 2018-11-02 DIAGNOSIS — B373 Candidiasis of vulva and vagina: Secondary | ICD-10-CM

## 2018-11-02 MED ORDER — FLUCONAZOLE 150 MG PO TABS: 150.0000 mg | ORAL_TABLET | ORAL | 0 refills | Status: DC | PRN

## 2018-11-02 NOTE — Progress Notes (Signed)
We are sorry that you are not feeling well. Here is how we plan to help! Based on what you shared with me it looks like you: May have a yeast vaginosis.  Approximately 5 minutes was spent documenting and reviewing patient's chart.    Vaginosis is an inflammation of the vagina that can result in discharge, itching and pain. The cause is usually a change in the normal balance of vaginal bacteria or an infection. Vaginosis can also result from reduced estrogen levels after menopause.  The most common causes of vaginosis are:   Bacterial vaginosis which results from an overgrowth of one on several organisms that are normally present in your vagina.   Yeast infections which are caused by a naturally occurring fungus called candida.   Vaginal atrophy (atrophic vaginosis) which results from the thinning of the vagina from reduced estrogen levels after menopause.   Trichomoniasis which is caused by a parasite and is commonly transmitted by sexual intercourse.  Factors that increase your risk of developing vaginosis include: . Medications, such as antibiotics and steroids . Uncontrolled diabetes . Use of hygiene products such as bubble bath, vaginal spray or vaginal deodorant . Douching . Wearing damp or tight-fitting clothing . Using an intrauterine device (IUD) for birth control . Hormonal changes, such as those associated with pregnancy, birth control pills or menopause . Sexual activity . Having a sexually transmitted infection  Your treatment plan is A single Diflucan (fluconazole) 150mg tablet once.  I have electronically sent this prescription into the pharmacy that you have chosen.  Be sure to take all of the medication as directed. Stop taking any medication if you develop a rash, tongue swelling or shortness of breath. Mothers who are breast feeding should consider pumping and discarding their breast milk while on these antibiotics. However, there is no consensus that infant exposure  at these doses would be harmful.  Remember that medication creams can weaken latex condoms. .   HOME CARE:  Good hygiene may prevent some types of vaginosis from recurring and may relieve some symptoms:  . Avoid baths, hot tubs and whirlpool spas. Rinse soap from your outer genital area after a shower, and dry the area well to prevent irritation. Don't use scented or harsh soaps, such as those with deodorant or antibacterial action. . Avoid irritants. These include scented tampons and pads. . Wipe from front to back after using the toilet. Doing so avoids spreading fecal bacteria to your vagina.  Other things that may help prevent vaginosis include:  . Don't douche. Your vagina doesn't require cleansing other than normal bathing. Repetitive douching disrupts the normal organisms that reside in the vagina and can actually increase your risk of vaginal infection. Douching won't clear up a vaginal infection. . Use a latex condom. Both female and female latex condoms may help you avoid infections spread by sexual contact. . Wear cotton underwear. Also wear pantyhose with a cotton crotch. If you feel comfortable without it, skip wearing underwear to bed. Yeast thrives in moist environments Your symptoms should improve in the next day or two.  GET HELP RIGHT AWAY IF:  . You have pain in your lower abdomen ( pelvic area or over your ovaries) . You develop nausea or vomiting . You develop a fever . Your discharge changes or worsens . You have persistent pain with intercourse . You develop shortness of breath, a rapid pulse, or you faint.  These symptoms could be signs of problems or infections that need to   be evaluated by a medical provider now.  MAKE SURE YOU    Understand these instructions.  Will watch your condition.  Will get help right away if you are not doing well or get worse.  Your e-visit answers were reviewed by a board certified advanced clinical practitioner to complete  your personal care plan. Depending upon the condition, your plan could have included both over the counter or prescription medications. Please review your pharmacy choice to make sure that you have choses a pharmacy that is open for you to pick up any needed prescription, Your safety is important to us. If you have drug allergies check your prescription carefully.   You can use MyChart to ask questions about today's visit, request a non-urgent call back, or ask for a work or school excuse for 24 hours related to this e-Visit. If it has been greater than 24 hours you will need to follow up with your provider, or enter a new e-Visit to address those concerns. You will get a MyChart message within the next two days asking about your experience. I hope that your e-visit has been valuable and will speed your recovery.  

## 2018-11-17 DIAGNOSIS — G4733 Obstructive sleep apnea (adult) (pediatric): Secondary | ICD-10-CM | POA: Diagnosis not present

## 2018-11-30 DIAGNOSIS — K589 Irritable bowel syndrome without diarrhea: Secondary | ICD-10-CM | POA: Insufficient documentation

## 2018-12-01 DIAGNOSIS — Z01419 Encounter for gynecological examination (general) (routine) without abnormal findings: Secondary | ICD-10-CM | POA: Diagnosis not present

## 2018-12-01 DIAGNOSIS — Z1231 Encounter for screening mammogram for malignant neoplasm of breast: Secondary | ICD-10-CM | POA: Diagnosis not present

## 2018-12-01 DIAGNOSIS — Z6832 Body mass index (BMI) 32.0-32.9, adult: Secondary | ICD-10-CM | POA: Diagnosis not present

## 2018-12-01 LAB — HM PAP SMEAR: HM Pap smear: NEGATIVE

## 2018-12-01 LAB — RESULTS CONSOLE HPV: CHL HPV: NEGATIVE

## 2018-12-02 LAB — HM MAMMOGRAPHY

## 2018-12-05 ENCOUNTER — Encounter: Payer: Self-pay | Admitting: Gastroenterology

## 2018-12-05 ENCOUNTER — Other Ambulatory Visit: Payer: Self-pay | Admitting: Obstetrics and Gynecology

## 2018-12-05 DIAGNOSIS — R928 Other abnormal and inconclusive findings on diagnostic imaging of breast: Secondary | ICD-10-CM

## 2018-12-08 ENCOUNTER — Other Ambulatory Visit: Payer: Self-pay

## 2018-12-08 ENCOUNTER — Ambulatory Visit
Admission: RE | Admit: 2018-12-08 | Discharge: 2018-12-08 | Disposition: A | Discharge status: Home / Self Care | Payer: 59 | Source: Ambulatory Visit | Attending: Obstetrics and Gynecology | Admitting: Obstetrics and Gynecology

## 2018-12-08 ENCOUNTER — Other Ambulatory Visit: Payer: Self-pay | Admitting: Obstetrics and Gynecology

## 2018-12-08 DIAGNOSIS — N6312 Unspecified lump in the right breast, upper inner quadrant: Secondary | ICD-10-CM | POA: Diagnosis not present

## 2018-12-08 DIAGNOSIS — R928 Other abnormal and inconclusive findings on diagnostic imaging of breast: Secondary | ICD-10-CM

## 2018-12-08 DIAGNOSIS — N6489 Other specified disorders of breast: Secondary | ICD-10-CM

## 2018-12-09 ENCOUNTER — Ambulatory Visit
Admission: RE | Admit: 2018-12-09 | Discharge: 2018-12-09 | Disposition: A | Discharge status: Home / Self Care | Payer: 59 | Source: Ambulatory Visit | Attending: Obstetrics and Gynecology | Admitting: Obstetrics and Gynecology

## 2018-12-09 DIAGNOSIS — N6489 Other specified disorders of breast: Secondary | ICD-10-CM

## 2018-12-09 DIAGNOSIS — N6312 Unspecified lump in the right breast, upper inner quadrant: Secondary | ICD-10-CM | POA: Diagnosis not present

## 2018-12-09 DIAGNOSIS — C50211 Malignant neoplasm of upper-inner quadrant of right female breast: Secondary | ICD-10-CM | POA: Diagnosis not present

## 2018-12-09 DIAGNOSIS — Z170 Estrogen receptor positive status [ER+]: Secondary | ICD-10-CM | POA: Diagnosis not present

## 2018-12-13 ENCOUNTER — Encounter: Payer: Self-pay | Admitting: *Deleted

## 2018-12-13 ENCOUNTER — Telehealth: Payer: Self-pay | Admitting: Oncology

## 2018-12-13 DIAGNOSIS — C50919 Malignant neoplasm of unspecified site of unspecified female breast: Secondary | ICD-10-CM | POA: Insufficient documentation

## 2018-12-13 DIAGNOSIS — C50211 Malignant neoplasm of upper-inner quadrant of right female breast: Secondary | ICD-10-CM

## 2018-12-13 DIAGNOSIS — Z170 Estrogen receptor positive status [ER+]: Secondary | ICD-10-CM

## 2018-12-13 NOTE — Telephone Encounter (Signed)
Spoke to patient to confirm afternoon [facility] appointment for 10/14, packet will be mailed to patient

## 2018-12-14 ENCOUNTER — Encounter: Payer: Self-pay | Admitting: *Deleted

## 2018-12-20 ENCOUNTER — Telehealth: Payer: Self-pay | Admitting: *Deleted

## 2018-12-20 NOTE — Telephone Encounter (Signed)
Called pt and discussed she would be able to have her husband on the phone or video conference in during her appt. Received verbal understanding. Denies further questions or needs at this time.

## 2018-12-20 NOTE — Progress Notes (Signed)
[place]  [phone] Fax:([phone]) [phone]     ID: Judy [name] DOB: 08/25/1965  MR#: [id number]  SPQ#:[id number]  Patient Care Team: Hali Marry, MD as PCP - General Louretta Shorten, MD (Obstetrics and Gynecology) Mauro Kaufmann, RN as Oncology Nurse Navigator Rockwell Germany, RN as Oncology Nurse Navigator [name], MD as Consulting Physician (General Surgery) Jamilla Galli, Virgie Dad, MD as Consulting Physician (Oncology) Kyung Rudd, MD as Consulting Physician (Radiation Oncology) Chauncey Cruel, MD OTHER MD:  CHIEF COMPLAINT: Estrogen receptor positive breast cancer  CURRENT TREATMENT: Awaiting definitive surgery.   HISTORY OF CURRENT ILLNESS: Judy [name] had routine screening mammography on 12/05/2018 showing a possible abnormality in the right breast. She underwent right diagnostic mammography with tomography and right breast ultrasonography at [facility] on 12/08/2018 showing: breast density category C; 1.7 cm irregular mass with associated distortion in the right breast at 1:30; right axilla negative for lymphadenopathy.   Accordingly on 12/09/2018 she proceeded to biopsy of the right breast area in question. The pathology from this procedure ([id number]) showed: invasive ductal carcinoma, grade 2; ductal carcinoma in situ, intermediate grade. Prognostic indicators significant for: estrogen receptor, 90% positive and progesterone receptor, 95% positive, both with strong staining intensity. Proliferation marker Ki67 at 10%. HER2 equivocal by immunohistochemistry (2+), but negative by fluorescent in situ hybridization with a signals ratio 1.32 and number per cell 2.25.  The patientDyer subsequent history is as detailed below.   INTERVAL HISTORY: Judy [name]" was evaluated in the multidisciplinary breast cancer clinic on 12/21/2018 . Her case was also presented at the multidisciplinary breast cancer conference on the same  day. At that time a preliminary plan was proposed: Breast conserving surgery with sentinel lymph node sampling, Oncotype, adjuvant radiation, antiestrogens   REVIEW OF SYSTEMS: Judy [name]" reports wearing glasses/contacts, hearing loss and wearing a hearing aid, anxiety, and depression. There were no specific symptoms leading to the original mammogram, which was routinely scheduled. The patient denies unusual headaches, visual changes, nausea, vomiting, stiff neck, dizziness, or gait imbalance. There has been no cough, phlegm production, or pleurisy, no chest pain or pressure, and no change in bowel or bladder habits. The patient denies fever, rash, bleeding, unexplained fatigue or unexplained weight loss. A detailed review of systems was otherwise entirely negative.   PAST MEDICAL HISTORY: Past Medical History:  Diagnosis Date   Eczema    legs   Hallux valgus 06/2012   left   Hammertoe 06/2012   left second   Heart murmur    since birth, has had no testing, no problems   History of frequent ear infections    Hypertension    under control with med., has been on med. x 3 yr.   Irritable bowel syndrome (IBS)    no current med.   Wears hearing aid    bilateral    PAST SURGICAL HISTORY: Past Surgical History:  Procedure Laterality Date   EXPLORATORY TYMPANOTOMY Left 06/25/2000   with lysis of adhesions   HAMMER TOE SURGERY Left 06/16/2012   Procedure: LEFT SCARF MODIFIED MCBRIDE AND SECOND Metatarsal WEIL;  Surgeon: Wylene Simmer, MD;  Location: [facility];  Service: Orthopedics;  Laterality: Left;   TONSILLECTOMY     TYMPANOPLASTY Left 06/25/2000   TYMPANOSTOMY TUBE PLACEMENT  10/10/2009    FAMILY HISTORY: Family History  Problem Relation Age of Onset   AlzheimerDyer disease Mother    Brain cancer Father  Glioblastoma multiforma.    Ovarian cancer Paternal 67    PatientDyer father was 4 years old when he died from [place], diagnosed age  44.. PatientDyer mother died at age 68. The patient notes a family hx of breast and ovarian cancer. A paternal aunt was diagnosed with ovarian cancer (age unsure) and a maternal cousin with breast cancer (age unknown).  The patient has 3 brothers, no sisters.    GYNECOLOGIC HISTORY:  No LMP recorded. Menarche: 53 years old Age at first live birth: 53 years old [place] P 2 LMP 12/14/2018, having regular periods Contraceptive: yes, used for 29 years, stopped 12/2018 HRT n/a  Hysterectomy? no BSO? no   SOCIAL HISTORY: (updated 12/2018)  Judy [name] "[name]" is currently working as an Investment banker, corporate for [employer]. Husband Judy [name] is a Designer, jewellery. She lives at home with her husband and son. Daughter Judy [name], age 45, is a Programme researcher, broadcasting/film/video at [employer] here in [place]. Son Judy [name], age 52, is a high Public affairs consultant.  The patient attends [employer] in [place].    ADVANCED DIRECTIVES: not in place.  In the absence of any documents to the contrary husband Judy [name] is automatically her HCPOA.   HEALTH MAINTENANCE: Social History   Tobacco Use   Smoking status: Never Smoker   Smokeless tobacco: Never Used  Substance Use Topics   Alcohol use: Yes    Comment: occasionally   Drug use: No     Colonoscopy: never done  PAP: 12/01/2018, negative  Bone density: not on file, due for repeat 01/2019   Allergies  Allergen Reactions   Erythromycin Nausea And Vomiting    FACIAL NUMBNESS   Nitrofurantoin Other (See Comments)    GI UPSET   Lexapro [Escitalopram Oxalate] Nausea Only   Lisinopril Cough    Current Outpatient Medications  Medication Sig Dispense Refill   FLUoxetine (PROZAC) 10 MG capsule Take 2 capsules (20 mg total) by mouth daily. 180 capsule 1   losartan-hydrochlorothiazide (HYZAAR) 50-12.5 MG tablet Take 1 tablet by mouth daily. 90 tablet 1   No current facility-administered medications for this visit.     OBJECTIVE: Middle-aged white [name] in no acute  distress  Vitals:   12/21/18 1252  BP: (!) 146/78  Pulse: (!) 105  Resp: 18  Temp: 98.7 F (37.1 C)  SpO2: 100%     Body mass index is 30.12 kg/m.   Wt Readings from Last 3 Encounters:  12/21/18 159 lb 6.4 oz (72.3 kg)  04/14/18 160 lb (72.6 kg)  10/12/17 165 lb 14.4 oz (75.3 kg)      ECOG FS:1 - Symptomatic but completely ambulatory  Ocular: Sclerae unicteric, pupils round and equal Ear-nose-throat: Wearing a mask Lymphatic: No cervical or supraclavicular adenopathy Lungs no rales or rhonchi Heart regular rate and rhythm Abd soft, nontender, positive bowel sounds MSK no focal spinal tenderness, no joint edema Neuro: non-focal, well-oriented, appropriate affect Breasts: The right breast is status post recent biopsy.  There is a mild ecchymosis.  There is no palpable mass.  The left breast is benign.  Both axillae are benign.   LAB RESULTS:  CMP     Component Value Date/Time   NA 142 12/21/2018 1220   K 3.1 (L) 12/21/2018 1220   CL 109 12/21/2018 1220   CO2 22 12/21/2018 1220   GLUCOSE 113 (H) 12/21/2018 1220   BUN 16 12/21/2018 1220   CREATININE 1.03 (H) 12/21/2018 1220   CREATININE 1.03 04/14/2018 1051   CALCIUM 8.8 (L) 12/21/2018  1220   PROT 7.5 12/21/2018 1220   ALBUMIN 3.9 12/21/2018 1220   AST 28 12/21/2018 1220   ALT 23 12/21/2018 1220   ALKPHOS 100 12/21/2018 1220   BILITOT 0.5 12/21/2018 1220   GFRNONAA >60 12/21/2018 1220   GFRNONAA 62 04/14/2018 1051   GFRAA >60 12/21/2018 1220   GFRAA 72 04/14/2018 1051    No results found for: TOTALPROTELP, ALBUMINELP, A1GS, A2GS, BETS, BETA2SER, GAMS, MSPIKE, SPEI  No results found for: KPAFRELGTCHN, LAMBDASER, KAPLAMBRATIO  Lab Results  Component Value Date   WBC 9.6 12/21/2018   NEUTROABS 7.2 12/21/2018   HGB 14.9 12/21/2018   HCT 45.6 12/21/2018   MCV 86.0 12/21/2018   PLT 337 12/21/2018    _0 @  No results found for: LABCA2  No components found for: KZSWFU932  No results for  input(s): INR in the last 168 hours.  No results found for: LABCA2  No results found for: TFT732  No results found for: KGU542  No results found for: HCW237  No results found for: CA2729  No components found for: HGQUANT  No results found for: CEA1 / No results found for: CEA1   No results found for: AFPTUMOR  No results found for: CHROMOGRNA  No results found for: PSA1  Appointment on 12/21/2018  Component Date Value Ref Range Status   Sodium 12/21/2018 142  135 - 145 mmol/L Final   Potassium 12/21/2018 3.1* 3.5 - 5.1 mmol/L Final   Chloride 12/21/2018 109  98 - 111 mmol/L Final   CO2 12/21/2018 22  22 - 32 mmol/L Final   Glucose, Bld 12/21/2018 113* 70 - 99 mg/dL Final   BUN 12/21/2018 16  6 - 20 mg/dL Final   Creatinine 12/21/2018 1.03* 0.44 - 1.00 mg/dL Final   Calcium 12/21/2018 8.8* 8.9 - 10.3 mg/dL Final   Total Protein 12/21/2018 7.5  6.5 - 8.1 g/dL Final   Albumin 12/21/2018 3.9  3.5 - 5.0 g/dL Final   AST 12/21/2018 28  15 - 41 U/L Final   ALT 12/21/2018 23  0 - 44 U/L Final   Alkaline Phosphatase 12/21/2018 100  38 - 126 U/L Final   Total Bilirubin 12/21/2018 0.5  0.3 - 1.2 mg/dL Final   GFR, Est Non Af Am 12/21/2018 >60  >60 mL/min Final   GFR, Est AFR Am 12/21/2018 >60  >60 mL/min Final   Anion gap 12/21/2018 11  5 - 15 Final   Performed at [facility], [place] [address] [place], [place] [address]   WBC Count 12/21/2018 9.6  4.0 - 10.5 K/uL Final   RBC 12/21/2018 5.30* 3.87 - 5.11 MIL/uL Final   Hemoglobin 12/21/2018 14.9  12.0 - 15.0 g/dL Final   HCT 12/21/2018 45.6  36.0 - 46.0 % Final   MCV 12/21/2018 86.0  80.0 - 100.0 fL Final   MCH 12/21/2018 28.1  26.0 - 34.0 pg Final   MCHC 12/21/2018 32.7  30.0 - 36.0 g/dL Final   RDW 12/21/2018 12.8  11.5 - 15.5 % Final   Platelet Count 12/21/2018 337  150 - 400 K/uL Final   nRBC 12/21/2018 0.0  0.0 - 0.2 % Final   Neutrophils Relative % 12/21/2018 74  %  Final   Neutro Abs 12/21/2018 7.2  1.7 - 7.7 K/uL Final   Lymphocytes Relative 12/21/2018 18  % Final   Lymphs Abs 12/21/2018 1.7  0.7 - 4.0 K/uL Final   Monocytes Relative 12/21/2018 5  % Final  Monocytes Absolute 12/21/2018 0.5  0.1 - 1.0 K/uL Final   Eosinophils Relative 12/21/2018 2  % Final   Eosinophils Absolute 12/21/2018 0.1  0.0 - 0.5 K/uL Final   Basophils Relative 12/21/2018 1  % Final   Basophils Absolute 12/21/2018 0.1  0.0 - 0.1 K/uL Final   Immature Granulocytes 12/21/2018 0  % Final   Abs Immature Granulocytes 12/21/2018 0.03  0.00 - 0.07 K/uL Final   Performed at [facility], [place] [address], [place], [place] [address]    (this displays the last labs from the last 3 days)  No results found for: TOTALPROTELP, ALBUMINELP, A1GS, A2GS, BETS, BETA2SER, GAMS, MSPIKE, SPEI (this displays SPEP labs)  No results found for: KPAFRELGTCHN, LAMBDASER, KAPLAMBRATIO (kappa/lambda light chains)  No results found for: HGBA, HGBA2QUANT, HGBFQUANT, HGBSQUAN (Hemoglobinopathy evaluation)   No results found for: LDH  No results found for: IRON, TIBC, IRONPCTSAT (Iron and TIBC)  No results found for: FERRITIN  Urinalysis No results found for: COLORURINE, APPEARANCEUR, LABSPEC, PHURINE, GLUCOSEU, HGBUR, BILIRUBINUR, KETONESUR, PROTEINUR, UROBILINOGEN, NITRITE, LEUKOCYTESUR   STUDIES: US Breast Ltd Uni Right Inc Axilla  Result Date: 12/08/2018 CLINICAL DATA:  Patient presents today recall from screen for possible right breast distortion. EXAM: DIGITAL DIAGNOSTIC RIGHT MAMMOGRAM WITH TOMO ULTRASOUND RIGHT BREAST COMPARISON:  Previous exam(s). ACR Breast Density Category c: The breast tissue is heterogeneously dense, which may obscure small masses. FINDINGS: Mammogram: Additional spot compression tomosynthesis views were performed for the questioned area in the upper inner right breast. There is persistent distortion measuring approximately 1.9 cm in  the upper inner quadrant. On physical exam, I palpate a discrete fixed firm area in the upper inner breast at approximately 2 o'clock. Targeted ultrasound is performed in the right breast at 1:30/2 o'clock 8 cm from the nipple demonstrating an irregular hypoechoic mass measuring approximately 1.7 x 1.3 x 1.1 cm with associated distortion, which corresponds to the mammographic finding. Targeted ultrasound of the right axilla demonstrates normal-appearing lymph nodes. IMPRESSION: Right breast mass with associated distortion at 1:30 o'clock is suspicious. RECOMMENDATION: Ultrasound-guided core needle biopsy of the right breast mass at 1:30 o'clock. I have discussed the findings and recommendations with the patient. If applicable, a reminder letter will be sent to the patient regarding the next appointment. BI-RADS CATEGORY  4: Suspicious. Electronically Signed   By: Audie Pinto M.D.   On: 12/08/2018 14:35   Mm Diag Breast Tomo Uni Right  Result Date: 12/08/2018 CLINICAL DATA:  Patient presents today recall from screen for possible right breast distortion. EXAM: DIGITAL DIAGNOSTIC RIGHT MAMMOGRAM WITH TOMO ULTRASOUND RIGHT BREAST COMPARISON:  Previous exam(s). ACR Breast Density Category c: The breast tissue is heterogeneously dense, which may obscure small masses. FINDINGS: Mammogram: Additional spot compression tomosynthesis views were performed for the questioned area in the upper inner right breast. There is persistent distortion measuring approximately 1.9 cm in the upper inner quadrant. On physical exam, I palpate a discrete fixed firm area in the upper inner breast at approximately 2 o'clock. Targeted ultrasound is performed in the right breast at 1:30/2 o'clock 8 cm from the nipple demonstrating an irregular hypoechoic mass measuring approximately 1.7 x 1.3 x 1.1 cm with associated distortion, which corresponds to the mammographic finding. Targeted ultrasound of the right axilla demonstrates  normal-appearing lymph nodes. IMPRESSION: Right breast mass with associated distortion at 1:30 o'clock is suspicious. RECOMMENDATION: Ultrasound-guided core needle biopsy of the right breast mass at 1:30 o'clock. I have discussed the findings and recommendations  with the patient. If applicable, a reminder letter will be sent to the patient regarding the next appointment. BI-RADS CATEGORY  4: Suspicious. Electronically Signed   By: Audie Pinto M.D.   On: 12/08/2018 14:35   Mm Clip Placement Right  Result Date: 12/09/2018 CLINICAL DATA:  Evaluate RIBBON clip placement following ultrasound-guided RIGHT breast biopsy EXAM: DIAGNOSTIC RIGHT MAMMOGRAM POST ULTRASOUND BIOPSY COMPARISON:  Previous exam(s). FINDINGS: Mammographic images were obtained following ultrasound guided biopsy of the 1.7 cm mass at the 1:30 position of the RIGHT breast. The RIBBON clip is in satisfactory position. IMPRESSION: Satisfactory RIBBON clip position following ultrasound-guided RIGHT breast biopsy. Final Assessment: Post Procedure Mammograms for Marker Placement Electronically Signed   By: Margarette Canada M.D.   On: 12/09/2018 09:06   [place] Rt Breast Bx W Loc Dev 1st Lesion Img Bx Spec US Guide  Addendum Date: 12/12/2018   ADDENDUM REPORT: 12/12/2018 14:13 ADDENDUM: Pathology revealed GRADE II INVASIVE DUCTAL CARCINOMA, INTERMEDIATE GRADE DUCTAL CARCINOMA IN SITU of the RIGHT breast, upper inner 1:30 position. This was found to be concordant by Dr. Hassan Rowan. Pathology results were discussed with the patient by telephone. The patient reported doing well after the biopsy with tenderness at the site. Post biopsy instructions and care were reviewed and questions were answered. The patient was encouraged to call [facility] for any additional concerns. The patient was referred to [facility] on December 21, 2018. Pathology results reported  by Stacie [facility], RN on 12/12/2018. Electronically Signed   By: Margarette Canada M.D.   On: 12/12/2018 14:13   Result Date: 12/12/2018 CLINICAL DATA:  53 year old female for tissue sampling of 1.7 cm UPPER INNER RIGHT breast mass. EXAM: ULTRASOUND GUIDED RIGHT BREAST CORE NEEDLE BIOPSY COMPARISON:  Previous exam(s). FINDINGS: I met with the patient and we discussed the procedure of ultrasound-guided biopsy, including benefits and alternatives. We discussed the high likelihood of a successful procedure. We discussed the risks of the procedure, including infection, bleeding, tissue injury, clip migration, and inadequate sampling. Informed written consent was given. The usual time-out protocol was performed immediately prior to the procedure. Lesion quadrant: UPPER INNER RIGHT breast. Using sterile technique and 1% Lidocaine as local anesthetic, under direct ultrasound visualization, a 12 gauge spring-loaded device was used to perform biopsy of the 1.7 cm mass at the 1:30 position of the RIGHT breast using a LATERAL approach. At the conclusion of the procedure a RIBBON tissue marker clip was deployed into the biopsy cavity. Follow up 2 view mammogram was performed and dictated separately. IMPRESSION: Ultrasound guided biopsy of UPPER INNER RIGHT breast mass. No apparent complications. Electronically Signed: By: Margarette Canada M.D. On: 12/09/2018 09:05    ELIGIBLE FOR AVAILABLE RESEARCH PROTOCOL: [place]  ASSESSMENT: 41 y.Judy [name], Judy [name] status post right breast upper inner quadrant biopsy 12/09/2018 for a clinical T1c N0, stage IA invasive ductal carcinoma, grade 2, estrogen and progesterone receptor positive, HER-2 nonamplified, with an MIB-1 of 10%  (1) definitive surgery pending  (2) Oncotype to be obtained from the definitive surgical sample: Chemotherapy not anticipated  (3) adjuvant radiation to follow  (4) antiestrogens to start at the completion of local treatment  (5) genetics  testing.  PLAN: I spent approximately 60 minutes face to face with Judy [name] with more than 50% of that time spent in counseling and coordination of care. Specifically we reviewed the biology of the patientDyer diagnosis and the  specifics of her situation.  We first reviewed the fact that cancer is not one disease but more than 100 different diseases and that it is important to keep them separate-- otherwise when friends and relatives discuss their own cancer experiences with [place] confusion can result. Similarly we explained that if breast cancer spreads to the bone or liver, the patient would not have bone cancer or liver cancer, but breast cancer in the bone and breast cancer in the liver: one cancer in three places-- not 3 different cancers which otherwise would have to be treated in 3 different ways.  We discussed the difference between local and systemic therapy. In terms of loco-regional treatment, lumpectomy plus radiation is equivalent to mastectomy as far as survival is concerned. For this reason, and because the cosmetic results are generally superior, we recommend breast conserving surgery.   We then discussed the rationale for systemic therapy. There is some risk that this cancer may have already spread to other parts of her body. Patients frequently ask at this point about bone scans, CAT scans and PET scans to find out if they have occult breast cancer somewhere else. The problem is that in early stage disease we are much more likely to find false positives then true cancers and this would expose the patient to unnecessary procedures as well as unnecessary radiation. Scans cannot answer the question the patient really would like to know, which is whether she has microscopic disease elsewhere in her body. For those reasons we do not recommend them.  Of course we would proceed to aggressive evaluation of any symptoms that might suggest metastatic disease, but that is not the case here.  Next  we went over the options for systemic therapy which are anti-estrogens, anti-HER-2 [name], and chemotherapy. Judy [name]. She is a good candidate for anti-estrogens.  The question of chemotherapy is more complicated. Chemotherapy is most effective in rapidly growing, aggressive tumors. It is much less effective in slow growing cancers, like Judy [name]. For that reason we are going to request an Oncotype from the definitive surgical sample, as suggested by NCCN guidelines. That will help [place] make a definitive decision regarding chemotherapy in this case.  She also meets criteria for genetics testing. In patients who carry a deleterious mutation [for example in a  BRCA gene], the risk of a new breast cancer developing in the future may be sufficiently great that the patient may choose bilateral mastectomies. However if she wishes to keep her breasts in that situation it is safe to do so. That would require intensified screening, which generally means not only yearly mammography but a yearly breast MRI as well.   Judy [name] has a good understanding of the overall plan. She agrees with it. She knows the goal of treatment in her case is cure. She will call with any problems that may develop before her next visit here.   Chauncey Cruel, MD   12/21/2018 5:57 PM Medical Oncology and Hematology [facility] [place], [place] [address] Tel. [phone]    Fax. [phone]   This document serves as a record of services personally performed by Lurline Del, MD. It was created on his behalf by Wilburn Mylar, a trained medical scribe. The creation of this record is based on the scribeDyer personal observations and the providerDyer statements to them.   Lindie Spruce MD, have reviewed the above documentation for accuracy and completeness, and I agree with  the above.

## 2018-12-20 NOTE — Progress Notes (Signed)
Radiation Oncology         [phone] ________________________________  Name: [name]        MRN: [id number]  Date of Service: 12/21/2018 DOB: 02-02-1966  CW:[id number], Rene Kocher, MD  [name], MD     REFERRING PHYSICIAN: [name], MD   DIAGNOSIS: The encounter diagnosis was Malignant neoplasm of upper-inner quadrant of right breast in female, estrogen receptor positive ([place]).   HISTORY OF PRESENT ILLNESS: [name] is a 53 y.o. adult seen in the multidisciplinary breast clinic for a new diagnosis of right breast cancer. The patient was noted to have a screening detected distortion in the right breast and further diagnostic imaging at 1:30-2:00 revealed a 1.7 x 1.3 x 1.1 cm mass and her axilla was negative. A biopsy on 12/09/2018 revealed a grade 2 invasive ductal carcinoma and the tumor was ER/PR positive, HER2 negative with a Ki 67 of 10%. She comes today to discuss treatment recommendations for her cancer.    PREVIOUS RADIATION THERAPY: No   PAST MEDICAL HISTORY:  Past Medical History:  Diagnosis Date   Eczema    legs   Hallux valgus 06/2012   left   Hammertoe 06/2012   left second   Heart murmur    since birth, has had no testing, no problems   History of frequent ear infections    Hypertension    under control with med., has been on med. x 3 yr.   Irritable bowel syndrome (IBS)    no current med.   Wears hearing aid    bilateral       PAST SURGICAL HISTORY: Past Surgical History:  Procedure Laterality Date   EXPLORATORY TYMPANOTOMY Left 06/25/2000   with lysis of adhesions   HAMMER TOE SURGERY Left 06/16/2012   Procedure: LEFT SCARF MODIFIED MCBRIDE AND SECOND Metatarsal WEIL;  Surgeon: Wylene Simmer, MD;  Location: [facility];  Service: Orthopedics;  Laterality: Left;   TYMPANOPLASTY Left 06/25/2000   TYMPANOSTOMY TUBE PLACEMENT  10/10/2009     FAMILY HISTORY:  Family History  Problem Relation Age of  Onset   Alzheimer's disease Mother    Brain cancer Father        Glioblastoma multiforma.      SOCIAL HISTORY:  reports that she has never smoked. She has never used smokeless tobacco. She reports current alcohol use. She reports that she does not use drugs.  The patient is married and lives in [place]. 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 She has a son who's a high school senior at home and a daughter and granddaughter who visit often.   ALLERGIES: Erythromycin, Nitrofurantoin, Lexapro [escitalopram oxalate], and Lisinopril   MEDICATIONS:  Current Outpatient Medications  Medication Sig Dispense Refill   amoxicillin-clavulanate (AUGMENTIN) 875-125 MG tablet Take 1 tablet by mouth 2 (two) times daily. 14 tablet 0   fluconazole (DIFLUCAN) 150 MG tablet Take 1 tablet (150 mg total) by mouth every three (3) days as needed. 3 tablet 0   FLUoxetine (PROZAC) 10 MG capsule Take 2 capsules (20 mg total) by mouth daily. 180 capsule 1   losartan-hydrochlorothiazide (HYZAAR) 50-12.5 MG tablet Take 1 tablet by mouth daily. 90 tablet 1   norethindrone-ethinyl estradiol (AUROVELA 1/20) 1-20 MG-MCG tablet Aurovela Fe 1-20 (28) 1 mg-20 mcg (21)/75 mg (7) tablet  TAKE 1 TABLET BY MOUTH EVERY DAY     norethindrone-ethinyl estradiol (JUNEL FE,GILDESS FE,LOESTRIN FE) 1-20 MG-MCG tablet Take 1  tablet by mouth daily.     No current facility-administered medications for this visit.      REVIEW OF SYSTEMS: On review of systems, the patient reports that she is doing well overall. She denies any chest pain, shortness of breath, cough, fevers, chills, night sweats, unintended weight changes. She denies any bowel or bladder disturbances, and denies abdominal pain, nausea or vomiting. She denies any new musculoskeletal or joint aches or pains. A complete review of systems is obtained and is otherwise negative.     PHYSICAL EXAM:  Wt Readings from Last 3 Encounters:  04/14/18  160 lb (72.6 kg)  10/12/17 165 lb 14.4 oz (75.3 kg)  04/07/17 161 lb (73 kg)   Temp Readings from Last 3 Encounters:  10/12/17 97.8 F (36.6 C) (Oral)  12/06/16 98.9 F (37.2 C) (Oral)  06/18/15 97.9 F (36.6 C) (Oral)   BP Readings from Last 3 Encounters:  10/18/18 110/75  04/14/18 122/68  10/12/17 130/66   Pulse Readings from Last 3 Encounters:  10/18/18 85  04/14/18 99  10/12/17 80   In general this is a well appearing caucasian female in no acute distress. She's alert and oriented x4 and appropriate throughout the examination. Cardiopulmonary assessment is negative for acute distress and she exhibits normal effort. Bilateral breast exam is deferred.   ECOG = 0  0 - Asymptomatic (Fully active, able to carry on all predisease activities without restriction)  1 - Symptomatic but completely ambulatory (Restricted in physically strenuous activity but ambulatory and able to carry out work of a light or sedentary nature. For example, light housework, office work)  2 - Symptomatic, <50% in bed during the day (Ambulatory and capable of all self care but unable to carry out any work activities. Up and about more than 50% of waking hours)  3 - Symptomatic, >50% in bed, but not bedbound (Capable of only limited self-care, confined to bed or chair 50% or more of waking hours)  4 - Bedbound (Completely disabled. Cannot carry on any self-care. Totally confined to bed or chair)  5 - Death   Eustace Pen MM, Creech RH, Tormey DC, et al. [phone]). "Toxicity and response criteria of the [facility]". [place] Oncol. 5 (6): 649-55    LABORATORY DATA:  Lab Results  Component Value Date   HGB 13.8 06/16/2012   Lab Results  Component Value Date   NA 136 04/14/2018   K 4.0 04/14/2018   CL 106 04/14/2018   CO2 19 (L) 04/14/2018   Lab Results  Component Value Date   ALT 15 04/14/2018   AST 19 04/14/2018   ALKPHOS 69 04/01/2016   BILITOT 0.6 04/14/2018       RADIOGRAPHY: US Breast Ltd Uni Right Inc Axilla  Result Date: 12/08/2018 CLINICAL DATA:  Patient presents today recall from screen for possible right breast distortion. EXAM: DIGITAL DIAGNOSTIC RIGHT MAMMOGRAM WITH TOMO ULTRASOUND RIGHT BREAST COMPARISON:  Previous exam(s). ACR Breast Density Category c: The breast tissue is heterogeneously dense, which may obscure small masses. FINDINGS: Mammogram: Additional spot compression tomosynthesis views were performed for the questioned area in the upper inner right breast. There is persistent distortion measuring approximately 1.9 cm in the upper inner quadrant. On physical exam, I palpate a discrete fixed firm area in the upper inner breast at approximately 2 o'clock. Targeted ultrasound is performed in the right breast at 1:30/2 o'clock 8 cm from the nipple demonstrating an irregular hypoechoic mass measuring approximately 1.7 x 1.3  x 1.1 cm with associated distortion, which corresponds to the mammographic finding. Targeted ultrasound of the right axilla demonstrates normal-appearing lymph nodes. IMPRESSION: Right breast mass with associated distortion at 1:30 o'clock is suspicious. RECOMMENDATION: Ultrasound-guided core needle biopsy of the right breast mass at 1:30 o'clock. I have discussed the findings and recommendations with the patient. If applicable, a reminder letter will be sent to the patient regarding the next appointment. BI-RADS CATEGORY  4: Suspicious. Electronically Signed   By: Audie Pinto M.D.   On: 12/08/2018 14:35   Mm Diag Breast Tomo Uni Right  Result Date: 12/08/2018 CLINICAL DATA:  Patient presents today recall from screen for possible right breast distortion. EXAM: DIGITAL DIAGNOSTIC RIGHT MAMMOGRAM WITH TOMO ULTRASOUND RIGHT BREAST COMPARISON:  Previous exam(s). ACR Breast Density Category c: The breast tissue is heterogeneously dense, which may obscure small masses. FINDINGS: Mammogram: Additional spot compression tomosynthesis  views were performed for the questioned area in the upper inner right breast. There is persistent distortion measuring approximately 1.9 cm in the upper inner quadrant. On physical exam, I palpate a discrete fixed firm area in the upper inner breast at approximately 2 o'clock. Targeted ultrasound is performed in the right breast at 1:30/2 o'clock 8 cm from the nipple demonstrating an irregular hypoechoic mass measuring approximately 1.7 x 1.3 x 1.1 cm with associated distortion, which corresponds to the mammographic finding. Targeted ultrasound of the right axilla demonstrates normal-appearing lymph nodes. IMPRESSION: Right breast mass with associated distortion at 1:30 o'clock is suspicious. RECOMMENDATION: Ultrasound-guided core needle biopsy of the right breast mass at 1:30 o'clock. I have discussed the findings and recommendations with the patient. If applicable, a reminder letter will be sent to the patient regarding the next appointment. BI-RADS CATEGORY  4: Suspicious. Electronically Signed   By: Audie Pinto M.D.   On: 12/08/2018 14:35   Mm Clip Placement Right  Result Date: 12/09/2018 CLINICAL DATA:  Evaluate RIBBON clip placement following ultrasound-guided RIGHT breast biopsy EXAM: DIAGNOSTIC RIGHT MAMMOGRAM POST ULTRASOUND BIOPSY COMPARISON:  Previous exam(s). FINDINGS: Mammographic images were obtained following ultrasound guided biopsy of the 1.7 cm mass at the 1:30 position of the RIGHT breast. The RIBBON clip is in satisfactory position. IMPRESSION: Satisfactory RIBBON clip position following ultrasound-guided RIGHT breast biopsy. Final Assessment: Post Procedure Mammograms for Marker Placement Electronically Signed   By: Margarette Canada M.D.   On: 12/09/2018 09:06   [place] Rt Breast Bx W Loc Dev 1st Lesion Img Bx Spec US Guide  Addendum Date: 12/12/2018   ADDENDUM REPORT: 12/12/2018 14:13 ADDENDUM: Pathology revealed GRADE II INVASIVE DUCTAL CARCINOMA, INTERMEDIATE GRADE DUCTAL CARCINOMA IN  SITU of the RIGHT breast, upper inner 1:30 position. This was found to be concordant by Dr. Hassan Rowan. Pathology results were discussed with the patient by telephone. The patient reported doing well after the biopsy with tenderness at the site. Post biopsy instructions and care were reviewed and questions were answered. The patient was encouraged to call [facility] for any additional concerns. The patient was referred to [facility] on December 21, 2018. Pathology results reported by Stacie Acres, RN on 12/12/2018. Electronically Signed   By: Margarette Canada M.D.   On: 12/12/2018 14:13   Result Date: 12/12/2018 CLINICAL DATA:  53 year old female for tissue sampling of 1.7 cm UPPER INNER RIGHT breast mass. EXAM: ULTRASOUND GUIDED RIGHT BREAST CORE NEEDLE BIOPSY COMPARISON:  Previous exam(s). FINDINGS: I met with the  patient and we discussed the procedure of ultrasound-guided biopsy, including benefits and alternatives. We discussed the high likelihood of a successful procedure. We discussed the risks of the procedure, including infection, bleeding, tissue injury, clip migration, and inadequate sampling. Informed written consent was given. The usual time-out protocol was performed immediately prior to the procedure. Lesion quadrant: UPPER INNER RIGHT breast. Using sterile technique and 1% Lidocaine as local anesthetic, under direct ultrasound visualization, a 12 gauge spring-loaded device was used to perform biopsy of the 1.7 cm mass at the 1:30 position of the RIGHT breast using a LATERAL approach. At the conclusion of the procedure a RIBBON tissue marker clip was deployed into the biopsy cavity. Follow up 2 view mammogram was performed and dictated separately. IMPRESSION: Ultrasound guided biopsy of UPPER INNER RIGHT breast mass. No apparent complications. Electronically Signed: By: Margarette Canada M.D. On: 12/09/2018 09:05        IMPRESSION/PLAN: 1. Stage IA, [id number] grade 2, ER/PR positive invasive ductal carcinoma of the right breast. Dr. Lisbeth Renshaw discusses the pathology findings and reviews the nature of invasive breast disease.  She does appear to be a good candidate for breast conservation with lumpectomy with sentinel node biopsy. Dr. Jana Hakim anticipates an Oncotype Dx score to determine a role for systemic therapy. Provided that chemotherapy is not indicated, the patient's course would then be followed by external radiotherapy to the breast followed by antiestrogen therapy. We discussed the risks, benefits, short, and long term effects of radiotherapy, and the patient is interested in proceeding. Dr. Lisbeth Renshaw discusses the delivery and logistics of radiotherapy and anticipates a course of 6 1/2 weeks of radiotherapy. We will see her back about 2 weeks after surgery to discuss the simulation process and anticipate we starting radiotherapy about 4-6 weeks after surgery.  2. Contraceptive Counseling.   In a visit lasting 30 minutes, greater than 50% of the time was spent face to face discussing her case, and coordinating the patient's care.  The above documentation reflects my direct findings during this shared patient visit. Please see the separate note by Dr. Lisbeth Renshaw on this date for the remainder of the patient's plan of care.    Carola Rhine, PAC

## 2018-12-21 ENCOUNTER — Other Ambulatory Visit: Payer: Self-pay | Admitting: General Surgery

## 2018-12-21 ENCOUNTER — Encounter: Payer: Self-pay | Admitting: Oncology

## 2018-12-21 ENCOUNTER — Encounter: Payer: Self-pay | Admitting: Physical Therapy

## 2018-12-21 ENCOUNTER — Inpatient Hospital Stay: Payer: 59

## 2018-12-21 ENCOUNTER — Ambulatory Visit: Payer: 59 | Attending: General Surgery | Admitting: Physical Therapy

## 2018-12-21 ENCOUNTER — Inpatient Hospital Stay (HOSPITAL_BASED_OUTPATIENT_CLINIC_OR_DEPARTMENT_OTHER): Payer: 59 | Admitting: Oncology

## 2018-12-21 ENCOUNTER — Ambulatory Visit
Admission: RE | Admit: 2018-12-21 | Discharge: 2018-12-21 | Disposition: A | Discharge status: Home / Self Care | Payer: 59 | Source: Ambulatory Visit | Attending: Radiation Oncology | Admitting: Radiation Oncology

## 2018-12-21 ENCOUNTER — Other Ambulatory Visit: Payer: Self-pay

## 2018-12-21 VITALS — BP 146/78 | HR 105 | Temp 98.7°F | Resp 18 | Ht 61.0 in | Wt 159.4 lb

## 2018-12-21 DIAGNOSIS — Z170 Estrogen receptor positive status [ER+]: Secondary | ICD-10-CM | POA: Insufficient documentation

## 2018-12-21 DIAGNOSIS — K589 Irritable bowel syndrome without diarrhea: Secondary | ICD-10-CM | POA: Insufficient documentation

## 2018-12-21 DIAGNOSIS — R293 Abnormal posture: Secondary | ICD-10-CM | POA: Insufficient documentation

## 2018-12-21 DIAGNOSIS — L309 Dermatitis, unspecified: Secondary | ICD-10-CM | POA: Insufficient documentation

## 2018-12-21 DIAGNOSIS — C50211 Malignant neoplasm of upper-inner quadrant of right female breast: Secondary | ICD-10-CM

## 2018-12-21 DIAGNOSIS — Z79899 Other long term (current) drug therapy: Secondary | ICD-10-CM | POA: Insufficient documentation

## 2018-12-21 DIAGNOSIS — F419 Anxiety disorder, unspecified: Secondary | ICD-10-CM | POA: Insufficient documentation

## 2018-12-21 DIAGNOSIS — Z809 Family history of malignant neoplasm, unspecified: Secondary | ICD-10-CM | POA: Diagnosis not present

## 2018-12-21 DIAGNOSIS — F329 Major depressive disorder, single episode, unspecified: Secondary | ICD-10-CM | POA: Insufficient documentation

## 2018-12-21 DIAGNOSIS — M2040 Other hammer toe(s) (acquired), unspecified foot: Secondary | ICD-10-CM | POA: Insufficient documentation

## 2018-12-21 DIAGNOSIS — Z3009 Encounter for other general counseling and advice on contraception: Secondary | ICD-10-CM | POA: Diagnosis not present

## 2018-12-21 DIAGNOSIS — M2012 Hallux valgus (acquired), left foot: Secondary | ICD-10-CM | POA: Insufficient documentation

## 2018-12-21 DIAGNOSIS — H9190 Unspecified hearing loss, unspecified ear: Secondary | ICD-10-CM | POA: Insufficient documentation

## 2018-12-21 DIAGNOSIS — I10 Essential (primary) hypertension: Secondary | ICD-10-CM | POA: Insufficient documentation

## 2018-12-21 DIAGNOSIS — R011 Cardiac murmur, unspecified: Secondary | ICD-10-CM | POA: Insufficient documentation

## 2018-12-21 LAB — CBC WITH DIFFERENTIAL (CANCER CENTER ONLY)
Abs Immature Granulocytes: 0.03 10*3/uL (ref 0.00–0.07)
Basophils Absolute: 0.1 10*3/uL (ref 0.0–0.1)
Basophils Relative: 1 %
Eosinophils Absolute: 0.1 10*3/uL (ref 0.0–0.5)
Eosinophils Relative: 2 %
HCT: 45.6 % (ref 36.0–46.0)
Hemoglobin: 14.9 g/dL (ref 12.0–15.0)
Immature Granulocytes: 0 %
Lymphocytes Relative: 18 %
Lymphs Abs: 1.7 10*3/uL (ref 0.7–4.0)
MCH: 28.1 pg (ref 26.0–34.0)
MCHC: 32.7 g/dL (ref 30.0–36.0)
MCV: 86 fL (ref 80.0–100.0)
Monocytes Absolute: 0.5 10*3/uL (ref 0.1–1.0)
Monocytes Relative: 5 %
Neutro Abs: 7.2 10*3/uL (ref 1.7–7.7)
Neutrophils Relative %: 74 %
Platelet Count: 337 10*3/uL (ref 150–400)
RBC: 5.3 MIL/uL — ABNORMAL HIGH (ref 3.87–5.11)
RDW: 12.8 % (ref 11.5–15.5)
WBC Count: 9.6 10*3/uL (ref 4.0–10.5)
nRBC: 0 % (ref 0.0–0.2)

## 2018-12-21 LAB — CMP (CANCER CENTER ONLY)
ALT: 23 U/L (ref 0–44)
AST: 28 U/L (ref 15–41)
Albumin: 3.9 g/dL (ref 3.5–5.0)
Alkaline Phosphatase: 100 U/L (ref 38–126)
Anion gap: 11 (ref 5–15)
BUN: 16 mg/dL (ref 6–20)
CO2: 22 mmol/L (ref 22–32)
Calcium: 8.8 mg/dL — ABNORMAL LOW (ref 8.9–10.3)
Chloride: 109 mmol/L (ref 98–111)
Creatinine: 1.03 mg/dL — ABNORMAL HIGH (ref 0.44–1.00)
GFR, Est AFR Am: >60 mL/min (ref >60)
GFR, Estimated: >60 mL/min (ref >60)
Glucose, Bld: 113 mg/dL — ABNORMAL HIGH (ref 70–99)
Potassium: 3.1 mmol/L — ABNORMAL LOW (ref 3.5–5.1)
Sodium: 142 mmol/L (ref 135–145)
Total Bilirubin: 0.5 mg/dL (ref 0.3–1.2)
Total Protein: 7.5 g/dL (ref 6.5–8.1)

## 2018-12-21 NOTE — Addendum Note (Signed)
Encounter addended by: Hayden Pedro, PA-C on: 12/21/2018 4:22 PM  Actions taken: Clinical Note Signed

## 2018-12-21 NOTE — Patient Instructions (Signed)

## 2018-12-21 NOTE — Therapy (Signed)
[place], [place], [address] Phone: [phone]   Fax:  [phone]  Physical Therapy Evaluation  Patient Details  Name: SHALEEN TALAMANTEZ MRN: [id number] Date of Birth: April 19, 1965 Referring Provider (PT): Dr. [name]   Encounter Date: 12/21/2018  PT End of Session - 12/21/18 1505    Visit Number  1    Number of Visits  2    Date for PT Re-Evaluation  02/15/19    PT Start Time  1017    PT Stop Time  1337    PT Time Calculation (min)  19 min    Activity Tolerance  Patient tolerated treatment well    Behavior During Therapy  [facility] for tasks assessed/performed       Past Medical History:  Diagnosis Date  . Eczema    legs  . Hallux valgus 06/2012   left  . Hammertoe 06/2012   left second  . Heart murmur    since birth, has had no testing, no problems  . History of frequent ear infections   . Hypertension    under control with med., has been on med. x 3 yr.  . Irritable bowel syndrome (IBS)    no current med.  . Wears hearing aid    bilateral    Past Surgical History:  Procedure Laterality Date  . EXPLORATORY TYMPANOTOMY Left 06/25/2000   with lysis of adhesions  . HAMMER TOE SURGERY Left 06/16/2012   Procedure: LEFT SCARF MODIFIED MCBRIDE AND SECOND Metatarsal WEIL;  Surgeon: Wylene Simmer, MD;  Location: [facility];  Service: Orthopedics;  Laterality: Left;  . TONSILLECTOMY    . TYMPANOPLASTY Left 06/25/2000  . TYMPANOSTOMY TUBE PLACEMENT  10/10/2009    There were no vitals filed for this visit.   Subjective Assessment - 12/21/18 1421    Subjective  Patient reports she is here today to be seen by her medical team for her newly diagnosed right breast cancer.    Pertinent History  Patient was diagnosed on 12/01/2018 with right grade II invasive ductal carcinoma breast cancer. It measures 1.7 cm and is located in the upper inner quadrant. It is ER/PR positive and HER2  negative with a Ki67 of 10%.    Patient Stated Goals  Reduce lymphedema risk and learn post op shoulder ROM HEP    Currently in Pain?  No/denies         [facility] PT Assessment - 12/21/18 0001      Assessment   Medical Diagnosis  Right breast cancer    Referring Provider (PT)  Dr. [name]    Onset Date/Surgical Date  12/01/18    Hand Dominance  Right    Prior Therapy  none      Precautions   Precautions  Other (comment)    Precaution Comments  active cancer      Restrictions   Weight Bearing Restrictions  No      Balance Screen   Has the patient fallen in the past 6 months  No    Has the patient had a decrease in activity level because of a fear of falling?   No    Is the patient reluctant to leave their home because of a fear of falling?   No      Home Environment   Living Environment  Private residence    Living Arrangements  Spouse/significant other;Children   Husband and 54 y.o. 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[employer]  She does not exercise      Cognition   Overall Cognitive Status  Within Functional Limits for tasks assessed      Posture/Postural Control   Posture/Postural Control  Postural limitations    Postural Limitations  Rounded Shoulders;Forward head      ROM / Strength   AROM / PROM / Strength  AROM;Strength      AROM   Overall AROM Comments  Cervical AROM IS WNL    AROM Assessment Site  Shoulder    Right/Left Shoulder  Right;Left    Right Shoulder Extension  45 Degrees    Right Shoulder Flexion  140 Degrees    Right Shoulder ABduction  151 Degrees    Right Shoulder Internal Rotation  70 Degrees    Right Shoulder External Rotation  90 Degrees    Left Shoulder Extension  52 Degrees    Left Shoulder Flexion  149 Degrees    Left Shoulder ABduction  154 Degrees    Left Shoulder Internal Rotation   58 Degrees    Left Shoulder External Rotation  90 Degrees      Strength   Overall Strength  Within functional limits for tasks performed        LYMPHEDEMA/ONCOLOGY QUESTIONNAIRE - 12/21/18 1504      Type   Cancer Type  Right breast cancer      Lymphedema Assessments   Lymphedema Assessments  Upper extremities      Right Upper Extremity Lymphedema   10 cm Proximal to Olecranon Process  30.2 cm    Olecranon Process  24.8 cm    10 cm Proximal to Ulnar Styloid Process  22.6 cm    Just Proximal to Ulnar Styloid Process  15.1 cm    Across Hand at [employer]  18.2 cm    At [place] of 2nd Digit  6.4 cm      Left Upper Extremity Lymphedema   10 cm Proximal to Olecranon Process  29.8 cm    Olecranon Process  25.5 cm    10 cm Proximal to Ulnar Styloid Process  23.4 cm    Just Proximal to Ulnar Styloid Process  15.2 cm    Across Hand at [employer]  19.1 cm    At [place] of 2nd Digit  6.3 cm          Quick Dash - 12/21/18 0001    Open a tight or new jar  No difficulty    Do heavy household chores (wash walls, wash floors)  No difficulty    Carry a shopping bag or briefcase  No difficulty    Wash your back  No difficulty    Use a knife to cut food  No difficulty    Recreational activities in which you take some force or impact through your arm, shoulder, or hand (golf, hammering, tennis)  No difficulty    During the past week, to what extent has your arm, shoulder or hand problem interfered with your normal social activities with family, friends, neighbors, or groups?  Not at all    During the past week, to what extent has your arm, shoulder or hand problem limited your work or other regular daily activities  Not at all    Arm, shoulder,  or hand pain.  None    Tingling (pins and needles) in your arm, shoulder, or hand  None    Difficulty Sleeping  No difficulty    DASH Score  0 %        Objective measurements completed on examination: See above findings.           Patient was instructed today in a home exercise program today for post op shoulder range of motion. These included active assist shoulder flexion in sitting, scapular retraction, wall walking with shoulder abduction, and hands behind head external rotation.  She was encouraged to do these twice a day, holding 3 seconds and repeating 5 times when permitted by her physician.        PT Education - 12/21/18 1505    Education Details  Lymphedema risk reduction and post op shoulder ROM HEP    Person(s) Educated  Patient    Methods  Explanation;Demonstration;Handout    Comprehension  Returned demonstration;Verbalized understanding          PT Long Term Goals - 12/21/18 1514      PT LONG TERM GOAL #1   Title  Patient will demonstrate she has regained full shoulder ROM and function post operatively compared to baselines.    Time  8    Period  Weeks    Status  New    Target Date  02/15/19      Breast Clinic Goals - 12/21/18 1510      Patient will be able to verbalize understanding of pertinent lymphedema risk reduction practices relevant to her diagnosis specifically related to skin care.   Time  1    Period  Days    Status  Achieved      Patient will be able to return demonstrate and/or verbalize understanding of the post-op home exercise program related to regaining shoulder range of motion.   Time  1    Period  Days    Status  Achieved      Patient will be able to verbalize understanding of the importance of attending the postoperative After Breast Cancer Class for further lymphedema risk reduction education and therapeutic exercise.   Time  1    Period  Days    Status  Achieved            Plan - 12/21/18 1506    Clinical Impression Statement  Patient was diagnosed on 12/01/2018 with right grade II invasive ductal carcinoma breast cancer. It measures 1.7 cm and is located in the upper inner quadrant. It is ER/PR positive and HER2 negative with a Ki67 of 10%. Her  multidisciplinary medical team met prior to her assessments to determine a recommended treatment plan. She is planning to have a right lumpectomy and sentinel node biopsy followed by Oncotype testing, radiation, and anti-estrogen therapy. She will benefit from a post op PT visit to reassess and determine needs.    Stability/Clinical Decision Making  Stable/Uncomplicated    Clinical Decision Making  Low    Rehab Potential  Excellent    PT Frequency  --   Eval and 1 f/u visit   PT Treatment/Interventions  Therapeutic exercise;Patient/family education;ADLs/Self Care Home Management    PT Next Visit Plan  Will reassess 3-4 weeks post op to determine needs    PT Home Exercise Plan  Post op shoulder ROM HEP    Consulted and Agree with Plan of Care  Patient       Patient will benefit from skilled  therapeutic intervention in order to improve the following deficits and impairments:  Postural dysfunction, Decreased range of motion, Decreased knowledge of precautions, Impaired UE functional use, Pain  Visit Diagnosis: Malignant neoplasm of upper-inner quadrant of right breast in female, estrogen receptor positive ([place]) - Plan: PT plan of care cert/re-cert  Abnormal posture - Plan: PT plan of care cert/re-cert   Patient will follow up at outpatient cancer rehab 3-4 weeks following surgery.  If the patient requires physical therapy at that time, a specific plan will be dictated and sent to the referring physician for approval. The patient was educated today on appropriate basic range of motion exercises to begin post operatively and the importance of attending the After Breast Cancer class following surgery.  Patient was educated today on lymphedema risk reduction practices as it pertains to recommendations that will benefit the patient immediately following surgery.  She verbalized good understanding.      Problem List Patient Active Problem List   Diagnosis Date Noted  . Malignant neoplasm of  upper-inner quadrant of right breast in female, estrogen receptor positive ([place]) 12/13/2018  . Irritable bowel syndrome 11/30/2018  . Hypertriglyceridemia 09/30/2015  . OSA (obstructive sleep apnea) 10/25/2013  . Hearing loss 05/28/2011  . Essential hypertension 09/03/2008  . GOITER 07/14/2007   Annia Friendly, PT 12/21/18 3:17 PM  [place], [place], [address] Phone: [phone]   Fax:  [phone]  Name: INAAYA VELLUCCI MRN: [id number] Date of Birth: Sep 13, 1965

## 2018-12-21 NOTE — Progress Notes (Signed)
Addendum:  The patient admitted to still having menstrual cycles monthly since being on OCP. She has recently stopped. She may be perimenopausal or menopausal. She is still sexually active and we discussed that we would need to test urine hcg prior to simulation for radiotherapy. She is aware that if she is sexually active during treatment she should use condoms for contraception due to risks of radiotherapy if she were to become pregnant. She is aware of this plan.     Carola Rhine, PAC

## 2018-12-22 ENCOUNTER — Encounter: Payer: Self-pay | Admitting: Oncology

## 2018-12-22 ENCOUNTER — Telehealth: Payer: Self-pay | Admitting: *Deleted

## 2018-12-22 ENCOUNTER — Telehealth: Payer: Self-pay | Admitting: Oncology

## 2018-12-22 HISTORY — PX: BREAST BIOPSY: SHX20

## 2018-12-22 NOTE — Telephone Encounter (Signed)
Called patient and left voice message with date and time for lab appointment for research labs to be drawn on 10/20 at 11:15.

## 2018-12-22 NOTE — Telephone Encounter (Signed)
I talk with patient regarding 1/12

## 2018-12-27 ENCOUNTER — Other Ambulatory Visit: Payer: 59

## 2018-12-27 ENCOUNTER — Other Ambulatory Visit: Payer: Self-pay | Admitting: Medical Oncology

## 2018-12-27 ENCOUNTER — Telehealth: Payer: Self-pay | Admitting: Oncology

## 2018-12-27 ENCOUNTER — Telehealth: Payer: Self-pay

## 2018-12-27 DIAGNOSIS — Z170 Estrogen receptor positive status [ER+]: Secondary | ICD-10-CM

## 2018-12-27 DIAGNOSIS — C50211 Malignant neoplasm of upper-inner quadrant of right female breast: Secondary | ICD-10-CM

## 2018-12-27 NOTE — Telephone Encounter (Signed)
Patient sent over [facility] paperwork and was printed off by LPN.

## 2018-12-27 NOTE — Telephone Encounter (Signed)
Returned patient's phone call regarding rescheduling missed 10/20 appointment, per patient's request appointment has moved to 10/22.

## 2018-12-29 ENCOUNTER — Inpatient Hospital Stay: Payer: 59

## 2018-12-29 ENCOUNTER — Encounter: Payer: Self-pay | Admitting: *Deleted

## 2018-12-29 ENCOUNTER — Other Ambulatory Visit: Payer: Self-pay

## 2018-12-29 ENCOUNTER — Telehealth: Payer: Self-pay | Admitting: *Deleted

## 2018-12-29 DIAGNOSIS — C50211 Malignant neoplasm of upper-inner quadrant of right female breast: Secondary | ICD-10-CM

## 2018-12-29 DIAGNOSIS — Z170 Estrogen receptor positive status [ER+]: Secondary | ICD-10-CM

## 2018-12-29 LAB — RESEARCH LABS

## 2018-12-29 NOTE — Progress Notes (Signed)
   Procurement of Librarian, academic for the Discovery and  Validation of Biomarkers for the Prediction, Diagnosis and Management of Disease  The study was introduced to the patient by Dr. Jana Hakim. I met with the patient to explain the study including purpose of the study, risks/benefits, participation requirements, voluntary participation and informed she will receive a $50 VISA gift card once research blood is drawn. Patient verbalizes understanding and agreeable to participate in the study. Patient signed consent/ HIPPA and signed copies were given to patient.  Patient agreed to have blood drawn for the study. Blood was collected 10/22  and patient was given a $50 VISA gift card provided by [facility]. Patient was thanked for her time and participation in the study. Farris Has [place]  12/29/18

## 2018-12-29 NOTE — Telephone Encounter (Signed)
Left message for a return phone call from [facility] .

## 2019-01-02 ENCOUNTER — Encounter: Payer: Self-pay | Admitting: *Deleted

## 2019-01-05 ENCOUNTER — Other Ambulatory Visit: Payer: Self-pay | Admitting: General Surgery

## 2019-01-05 DIAGNOSIS — Z170 Estrogen receptor positive status [ER+]: Secondary | ICD-10-CM

## 2019-01-05 DIAGNOSIS — C50211 Malignant neoplasm of upper-inner quadrant of right female breast: Secondary | ICD-10-CM

## 2019-01-11 ENCOUNTER — Telehealth: Payer: Self-pay

## 2019-01-11 NOTE — Telephone Encounter (Signed)
Nutrition  Reached out to patient via phone as attended [facility] on 10/14.  No answer. Left message with call back number.  Judy Carnell B. [name] [place], [place], [place] Registered Dietitian [phone] (pager)

## 2019-01-12 NOTE — Pre-Procedure Instructions (Signed)
[name]  01/12/2019     Your procedure is scheduled on Thursday, November 12.  Report to [facility], Main Entrance or Entrance "A" at 8:30 AM                       Your surgery or procedure is scheduled for  10:30 A.M.   Call this number if you have problems the morning of surgery: [phone]  This is the number for the Pre- Surgical Desk.          For any other questions, please call [phone], Monday - Friday 8 AM - 4 PM.   Remember:  Do not eat after midnight.  You may drink clear liquids until 7:30 AM.  Clear liquids allowed are: Carbonated Beverages, Water, Clear Tea, Black Coffee only, Juice (non-citric and without pulp), Gatorade, Plain Jell-O  only, Plain Popsicles only           Nothing to  Drink after 7:30 AM   Take these medicines the morning of surgery with A SIP OF WATER: FLUoxetine (PROZAC)  STOP taking Aspirin, Aspirin Products (Goody Powder, Excedrin Migraine), Ibuprofen (Advil), Naproxen (Aleve), Vitamins and Herbal Products (ie Fish Oil).  Special instructions:  [facility]- Preparing For Surgery  Before surgery, you can play an important role. Because skin is not sterile, your skin needs to be as free of germs as possible. You can reduce the number of germs on your skin by washing with CHG (chlorahexidine gluconate) Soap before surgery.  CHG is an antiseptic cleaner which kills germs and bonds with the skin to continue killing germs even after washing.    Oral Hygiene is also important to reduce your risk of infection.  Remember - BRUSH YOUR TEETH THE MORNING OF SURGERY WITH YOUR REGULAR TOOTHPASTE  Please do not use if you have an allergy to CHG or antibacterial soaps. If your skin becomes reddened/irritated stop using the CHG.  Do not shave (including legs and underarms) for at least 48 hours prior to first CHG shower. It is OK to shave your face.  Please follow these instructions carefully.   1. Shower the NIGHT BEFORE SURGERY and  the MORNING OF SURGERY with CHG.   2. If you chose to wash your hair, wash your hair first as usual with your normal shampoo.  3. After you shampoo, rinse your hair and body thoroughly to remove the shampoo.  4. Use CHG as you would any other liquid soap. You can apply CHG directly to the skin and wash gently with a scrungie or a clean washcloth.   5. Apply the CHG Soap to your body ONLY FROM THE NECK DOWN.  Do not use on open wounds or open sores. Avoid contact with your eyes, ears, mouth and genitals (private parts). Wash Face and genitals (private parts)  with your normal soap.  6. Wash thoroughly, paying special attention to the area where your surgery will be performed.  7. Thoroughly rinse your body with warm water from the neck down.  8. DO NOT shower/wash with your normal soap after using and rinsing off the CHG Soap.  9. Pat yourself dry with a CLEAN TOWEL.  10. Wear CLEAN PAJAMAS to bed the night before surgery, wear comfortable clothes the morning of surgery  11. Place CLEAN SHEETS on your bed the night of your first shower and DO NOT SLEEP WITH PETS.  Day of Surgery: Shower as instructed above. Do not wear  lotions, powders, or perfumes, or deodorant. Please wear clean clothes to the hospital/surgery center.   Remember to brush your teeth WITH YOUR REGULAR TOOTHPASTE.  Do not wear jewelry, make-up or nail polish.  Do not shave 48 hours prior to surgery.  Men may shave face and neck.  Do not bring valuables to the hospital.  [facility] is not responsible for any belongings or valuables.  Contacts, dentures or bridgework may not be worn into surgery.  Leave your suitcase in the car.  After surgery it may be brought to your room.  For patients admitted to the hospital, discharge time will be determined by your treatment team.  Patients discharged the day of surgery will not be allowed to drive home.   Please read over the following fact sheets that you were given: Pain  Booklet,Coughing and Deep Breathing, Surgical Site Infections.

## 2019-01-13 ENCOUNTER — Encounter (HOSPITAL_COMMUNITY): Payer: Self-pay

## 2019-01-13 ENCOUNTER — Encounter: Payer: 59 | Admitting: Gastroenterology

## 2019-01-13 ENCOUNTER — Encounter (HOSPITAL_COMMUNITY)
Admission: RE | Admit: 2019-01-13 | Discharge: 2019-01-13 | Disposition: A | Discharge status: Home / Self Care | Payer: 59 | Source: Ambulatory Visit | Attending: General Surgery | Admitting: General Surgery

## 2019-01-13 ENCOUNTER — Other Ambulatory Visit: Payer: Self-pay

## 2019-01-13 DIAGNOSIS — Z01812 Encounter for preprocedural laboratory examination: Secondary | ICD-10-CM | POA: Insufficient documentation

## 2019-01-13 LAB — BASIC METABOLIC PANEL
Anion gap: 11 (ref 5–15)
BUN: 14 mg/dL (ref 6–20)
CO2: 20 mmol/L — ABNORMAL LOW (ref 22–32)
Calcium: 9.1 mg/dL (ref 8.9–10.3)
Chloride: 109 mmol/L (ref 98–111)
Creatinine, Ser: 1.03 mg/dL — ABNORMAL HIGH (ref 0.44–1.00)
GFR calc Af Amer: >60 mL/min (ref >60)
GFR calc non Af Amer: >60 mL/min (ref >60)
Glucose, Bld: 120 mg/dL — ABNORMAL HIGH (ref 70–99)
Potassium: 3.8 mmol/L (ref 3.5–5.1)
Sodium: 140 mmol/L (ref 135–145)

## 2019-01-13 LAB — POCT PREGNANCY, URINE: Preg Test, Ur: NEGATIVE

## 2019-01-13 LAB — HEMOGLOBIN: Hemoglobin: 14.4 g/dL (ref 12.0–15.0)

## 2019-01-13 NOTE — Progress Notes (Addendum)
PCP - Dr Altha Harm   Cardiologist - no  Chest x-ray - na  EKG - 01/13/2019  Stress Test - no  ECHO - no  Cardiac Cath - no  Sleep Study - 2015  CPAP - yes  LABS- hgb, BMP  ASA-na  ERAS-yes- clears until 7:30 AM  HA1C-na Fasting Blood Sugar - na Checks Blood Sugar ___0__ times a day  Anesthesia-  Pt denies having chest pain, sob, or fever at this time. All instructions explained to the pt, with a verbal understanding of the material. Pt agrees to go over the instructions while at home for a better understanding. Pt also instructed to self quarantine after being tested for COVID-19. The opportunity to ask questions was provided.

## 2019-01-16 ENCOUNTER — Other Ambulatory Visit (HOSPITAL_COMMUNITY)
Admission: RE | Admit: 2019-01-16 | Discharge: 2019-01-16 | Disposition: A | Discharge status: Home / Self Care | Payer: 59 | Source: Ambulatory Visit | Attending: General Surgery | Admitting: General Surgery

## 2019-01-16 ENCOUNTER — Telehealth: Payer: Self-pay

## 2019-01-16 DIAGNOSIS — Z01812 Encounter for preprocedural laboratory examination: Secondary | ICD-10-CM | POA: Insufficient documentation

## 2019-01-16 DIAGNOSIS — Z20828 Contact with and (suspected) exposure to other viral communicable diseases: Secondary | ICD-10-CM | POA: Diagnosis not present

## 2019-01-16 LAB — SARS CORONAVIRUS 2 (TAT 6-24 HRS): SARS Coronavirus 2: NEGATIVE

## 2019-01-16 NOTE — Telephone Encounter (Signed)
Thank you. GM 

## 2019-01-16 NOTE — Telephone Encounter (Signed)
Spoke to Mrs. Hindes and assured her that her paperwork was faxed again today to [employer].  Also told her I would email scan her a copy as well for her records.  Gardiner Rhyme

## 2019-01-18 ENCOUNTER — Other Ambulatory Visit: Payer: Self-pay | Admitting: General Surgery

## 2019-01-18 ENCOUNTER — Other Ambulatory Visit: Payer: Self-pay

## 2019-01-18 ENCOUNTER — Ambulatory Visit
Admission: RE | Admit: 2019-01-18 | Discharge: 2019-01-18 | Disposition: A | Discharge status: Home / Self Care | Payer: 59 | Source: Ambulatory Visit | Attending: General Surgery | Admitting: General Surgery

## 2019-01-18 DIAGNOSIS — C50211 Malignant neoplasm of upper-inner quadrant of right female breast: Secondary | ICD-10-CM

## 2019-01-18 DIAGNOSIS — Z170 Estrogen receptor positive status [ER+]: Secondary | ICD-10-CM

## 2019-01-18 NOTE — H&P (Signed)
[name] Appointment: 12/21/2018 1:00 PM Location: [facility] Patient #: [id number] DOB: Jul 21, 1965 Undefined / Language: [name] / Race: White Female   History of Present Illness Judy Klein MD; 12/21/2018 4:12 PM) The patient is a 53 year old female who presents with breast cancer. Pt is a lovely 53 yo F referred by Dr. Louretta [name] for a new diagnosis of right breast cancer 12/2018. She presented wtih screening detected architectural distortion. Diagnostic imaging was performed which showed a 1.7 cm mass in the upper inner quadrant on the right. The axilla was negative. Core needle biopsy was performed which showed grade 2 invasive ductal carcinoma, ER/PR +, her 2 -, Ki 67 10%. She has not had prior abnormal mammogram. She has breast density C.   She had menarche at [name] 6. She is still having regular periods. She is a G2P2 wtih first child at [name] 53. She used OCPs for 29 years. She had not yet had a colonoscopy. She has had a bone density study. She is up to date with her pap smear.   She has family history of cancer in father wtih ovarian cancer, paternal aunt wtih ovarian cancer, and maternal cousin with breast cancer.   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    dx mammogram/us 12/08/2018 EXAM: DIGITAL DIAGNOSTIC RIGHT MAMMOGRAM WITH TOMO  ULTRASOUND RIGHT BREAST  COMPARISON: Previous exam(s).  ACR Breast Density Category c: The breast tissue is heterogeneously dense, which may obscure small masses.  FINDINGS: Mammogram: Additional spot compression tomosynthesis views were performed for the questioned area in the upper inner right breast. There is persistent distortion measuring approximately 1.9 cm in the upper inner quadrant.  On physical exam, I palpate a discrete fixed firm area in the upper inner breast at approximately 2 o'clock.  Targeted ultrasound is performed in the right breast at 1:30/2 o'clock 8 cm from  the nipple demonstrating an irregular hypoechoic mass measuring approximately 1.7 x 1.3 x 1.1 cm with associated distortion, which corresponds to the mammographic finding.  Targeted ultrasound of the right axilla demonstrates normal-appearing lymph nodes.  IMPRESSION: Right breast mass with associated distortion at 1:30 o'clock is suspicious.  RECOMMENDATION: Ultrasound-guided core needle biopsy of the right breast mass at 1:30 o'clock.  I have discussed the findings and recommendations with the patient. If applicable, a reminder letter will be sent to the patient regarding the next appointment.  BI-RADS CATEGORY 4: Suspicious.  pathology 12/09/2018 Breast, right, needle core biopsy, upper inner 1:30 position - INVASIVE DUCTAL CARCINOMA, GRADE 2. SEE NOTE - DUCTAL CARCINOMA IN SITU, INTERMEDIATE GRADE Estrogen Receptor: 90%, POSITIVE, STRONG STAINING INTENSITY Progesterone Receptor: 95%, POSITIVE, STRONG STAINING INTENSITY Proliferation Marker Ki67: 10% GROUP 5: HER2 **NEGATIVE**  Labs 12/21/2018 CBC, CMET essentially normal except for K 3.1   Past Surgical History Judy Pummel, RN; 12/21/2018 7:24 AM) No pertinent past surgical history   Diagnostic Studies History Judy Pummel, RN; 12/21/2018 7:24 AM) Colonoscopy  never Mammogram  within last year Pap Smear  1-5 years ago  Medication History Judy Pummel, RN; 12/21/2018 7:27 AM) Medications Reconciled  Social History Judy Pummel, RN; 12/21/2018 7:24 AM) Alcohol use  Occasional alcohol use. Caffeine use  Coffee. No drug use  Tobacco use  Never smoker.  Family History Judy Pummel, RN; 12/21/2018 7:24 AM) Cancer  Father. Hypertension  Father, Mother.  Pregnancy / Birth History Judy Pummel, RN; 12/21/2018 7:24 AM) [name] at menarche  38 years. Contraceptive History  Oral contraceptives. [name]  2 Length (months) of breastfeeding  3-6 Maternal [name]  23-35 Para  2 Regular  periods   Other Problems Judy Pummel, RN; 12/21/2018 7:24 AM) Anxiety Disorder  Depression  Heart murmur  High blood pressure  Sleep Apnea     Review of Systems Judy Spillers Ledford RN; 12/21/2018 7:24 AM) General Not Present- Appetite Loss, Chills, Fatigue, Fever, Night Sweats, Weight Gain and Weight Loss. Skin Not Present- Change in Wart/Mole, Dryness, Hives, Jaundice, New Lesions, Non-Healing Wounds, Rash and Ulcer. HEENT Present- Hearing Loss and Wears glasses/contact lenses. Not Present- Earache, Hoarseness, Nose Bleed, Oral Ulcers, Ringing in the Ears, Seasonal Allergies, Sinus Pain, Sore Throat, Visual Disturbances and Yellow Eyes. Respiratory Not Present- Bloody sputum, Chronic Cough, Difficulty Breathing, Snoring and Wheezing. Breast Not Present- Breast Mass, Breast Pain, Nipple Discharge and Skin Changes. Cardiovascular Not Present- Chest Pain, Difficulty Breathing Lying Down, Leg Cramps, Palpitations, Rapid Heart Rate, Shortness of Breath and Swelling of Extremities. Gastrointestinal Not Present- Abdominal Pain, Bloating, Bloody Stool, Change in Bowel Habits, Chronic diarrhea, Constipation, Difficulty Swallowing, Excessive gas, Gets full quickly at meals, Hemorrhoids, Indigestion, Nausea, Rectal Pain and Vomiting. Female Genitourinary Not Present- Frequency, Nocturia, Painful Urination, Pelvic Pain and Urgency. Musculoskeletal Not Present- Back Pain, Joint Pain, Joint Stiffness, Muscle Pain, Muscle Weakness and Swelling of Extremities. Neurological Not Present- Decreased Memory, Fainting, Headaches, Numbness, Seizures, Tingling, Tremor, Trouble walking and Weakness. Psychiatric Present- Anxiety and Depression. Not Present- Bipolar, Change in Sleep Pattern, Fearful and Frequent crying. Endocrine Not Present- Cold Intolerance, Excessive Hunger, Hair Changes, Heat Intolerance, Hot flashes and New Diabetes. Hematology Not Present- Blood Thinners, Easy Bruising, Excessive bleeding,  Gland problems, HIV and Persistent Infections.  Vitals Judy Klein MD; 12/21/2018 4:02 PM) 12/21/2018 4:01 PM Weight: 159.4 lb Height: 61in Body Surface Area: 1.72 m Body Mass Index: 30.12 kg/m  Temp.: 98.59F  [name]: 105 (Regular)  Resp.: 18 (Unlabored)  BP: 146/78 (Sitting, Left Arm, Standard)       Physical Exam Judy Klein MD; 12/21/2018 4:09 PM) General Mental Status-Alert. General Appearance-Consistent with stated [name]. Hydration-Well hydrated. Voice-Normal.  Head and Neck Head-normocephalic, atraumatic with no lesions or palpable masses. Trachea-midline. Thyroid Gland Characteristics - normal size and consistency.  Eye Eyeball - Bilateral-Extraocular movements intact. Sclera/Conjunctiva - Bilateral-No scleral icterus.  Chest and Lung Exam Chest and lung exam reveals -quiet, even and easy respiratory effort with no use of accessory muscles and on auscultation, normal breath sounds, no adventitious sounds and normal vocal resonance. Inspection Chest Wall - Normal. Back - normal.  Breast Note: breasts relatively symmetric. mild to moderate ptosis. no palpable masses. no skin dimpling. no nipple retraction or nipple discharge. no LAD. left breast also without any of those positive findings.   Cardiovascular Cardiovascular examination reveals -normal heart sounds, regular rate and rhythm with no murmurs and normal pedal pulses bilaterally.  Abdomen Inspection Inspection of the abdomen reveals - No Hernias. Palpation/Percussion Palpation and Percussion of the abdomen reveal - Soft, Non Tender, No Rebound tenderness, No Rigidity (guarding) and No hepatosplenomegaly. Auscultation Auscultation of the abdomen reveals - Bowel sounds normal.  Neurologic Neurologic evaluation reveals -alert and oriented x 3 with no impairment of recent or remote memory. Mental Status-Normal.  Musculoskeletal Global Assessment -Note: no  gross deformities.  Normal Exam - Left-Upper Extremity Strength Normal and Lower Extremity Strength Normal. Normal Exam - Right-Upper Extremity Strength Normal and Lower Extremity Strength Normal.  Lymphatic Head & Neck  General Head & Neck Lymphatics: Bilateral - Description - Normal. Axillary  General Axillary  Region: Bilateral - Description - Normal. Tenderness - Non Tender. Femoral & Inguinal  Generalized Femoral & Inguinal Lymphatics: Bilateral - Description - No Generalized lymphadenopathy.    Assessment & Plan Judy Klein MD; 12/21/2018 4:12 PM) MALIGNANT NEOPLASM OF UPPER-INNER QUADRANT OF RIGHT BREAST IN FEMALE, ESTROGEN RECEPTOR POSITIVE (C50.211) Impression: Pt has new diagnosis of cT1cN0 right breast cancer. Discussed pros and cons of MRI wtih breast density C. Decided not to get it.  Will plan right seed localized lumpectomy wtih sentinel lymph node biopsy. This would be followed by oncotype (or mammaprint as appropriate), radiation, and antiestrogen tx.  The surgical procedure was described to the patient. I discussed the incision type and location and that we would need radiology involved on with a wire or seed marker and/or sentinel node.  The risks and benefits of the procedure were described to the patient and she wishes to proceed.  We discussed the risks bleeding, infection, damage to other structures, need for further procedures/surgeries. We discussed the risk of seroma. The patient was advised if the area in the breast in cancer, we may need to go back to surgery for additional tissue to obtain negative margins or for a lymph node biopsy. The patient was advised that these are the most common complications, but that others can occur as well. They were advised against taking aspirin or other anti-inflammatory agents/blood thinners the week before surgery. Current Plans You are being scheduled for surgery- Our schedulers will call you.  You should hear  from our office's scheduling department within 5 working days about the location, date, and time of surgery. We try to make accommodations for patient's preferences in scheduling surgery, but sometimes the OR schedule or the surgeon's schedule prevents [place] from making those accommodations.  If you have not heard from our office [phone]) in 5 working days, call the office and ask for your surgeon's nurse.  If you have other questions about your diagnosis, plan, or surgery, call the office and ask for your surgeon's nurse.  Pt Education - flb breast cancer surgery: discussed with patient and provided information. FAMILY HISTORY OF CANCER (Z80.9) Impression: Genetics referral.    Signed electronically by Judy Klein, MD (12/21/2018 4:12 PM)

## 2019-01-19 ENCOUNTER — Ambulatory Visit
Admission: RE | Admit: 2019-01-19 | Discharge: 2019-01-19 | Disposition: A | Discharge status: Home / Self Care | Payer: 59 | Source: Ambulatory Visit | Attending: General Surgery | Admitting: General Surgery

## 2019-01-19 ENCOUNTER — Encounter (HOSPITAL_COMMUNITY)
Admission: RE | Disposition: A | Discharge status: Home / Self Care | Payer: Self-pay | Source: Home / Self Care | Attending: General Surgery

## 2019-01-19 ENCOUNTER — Ambulatory Visit (HOSPITAL_COMMUNITY)
Admission: RE | Admit: 2019-01-19 | Discharge: 2019-01-19 | Disposition: A | Discharge status: Home / Self Care | Payer: 59 | Attending: General Surgery | Admitting: General Surgery

## 2019-01-19 ENCOUNTER — Ambulatory Visit (HOSPITAL_COMMUNITY): Payer: 59 | Admitting: Anesthesiology

## 2019-01-19 ENCOUNTER — Encounter (HOSPITAL_COMMUNITY): Payer: Self-pay

## 2019-01-19 ENCOUNTER — Other Ambulatory Visit: Payer: Self-pay

## 2019-01-19 ENCOUNTER — Ambulatory Visit (HOSPITAL_COMMUNITY)
Admission: RE | Admit: 2019-01-19 | Discharge: 2019-01-19 | Disposition: A | Discharge status: Home / Self Care | Payer: 59 | Source: Ambulatory Visit | Attending: General Surgery | Admitting: General Surgery

## 2019-01-19 ENCOUNTER — Ambulatory Visit (HOSPITAL_COMMUNITY): Payer: 59 | Admitting: Vascular Surgery

## 2019-01-19 DIAGNOSIS — Z803 Family history of malignant neoplasm of breast: Secondary | ICD-10-CM | POA: Diagnosis not present

## 2019-01-19 DIAGNOSIS — G8918 Other acute postprocedural pain: Secondary | ICD-10-CM | POA: Diagnosis not present

## 2019-01-19 DIAGNOSIS — C50211 Malignant neoplasm of upper-inner quadrant of right female breast: Secondary | ICD-10-CM

## 2019-01-19 DIAGNOSIS — G4730 Sleep apnea, unspecified: Secondary | ICD-10-CM | POA: Diagnosis not present

## 2019-01-19 DIAGNOSIS — C50912 Malignant neoplasm of unspecified site of left female breast: Secondary | ICD-10-CM | POA: Diagnosis not present

## 2019-01-19 DIAGNOSIS — Z170 Estrogen receptor positive status [ER+]: Secondary | ICD-10-CM | POA: Insufficient documentation

## 2019-01-19 DIAGNOSIS — I10 Essential (primary) hypertension: Secondary | ICD-10-CM | POA: Insufficient documentation

## 2019-01-19 DIAGNOSIS — C50911 Malignant neoplasm of unspecified site of right female breast: Secondary | ICD-10-CM | POA: Diagnosis not present

## 2019-01-19 HISTORY — PX: BREAST LUMPECTOMY WITH RADIOACTIVE SEED AND SENTINEL LYMPH NODE BIOPSY: SHX6550

## 2019-01-19 HISTORY — PX: BREAST LUMPECTOMY: SHX2

## 2019-01-19 SURGERY — BREAST LUMPECTOMY WITH RADIOACTIVE SEED AND SENTINEL LYMPH NODE BIOPSY
Anesthesia: General | Site: Breast | Laterality: Right

## 2019-01-19 MED ORDER — PROPOFOL 10 MG/ML IV BOLUS
INTRAVENOUS | Status: AC
  Filled 2019-01-19: qty 20

## 2019-01-19 MED ORDER — OXYCODONE HCL 5 MG PO TABS: 5.0000 mg | ORAL_TABLET | Freq: Four times a day (QID) | ORAL | 0 refills | Status: DC | PRN

## 2019-01-19 MED ORDER — LACTATED RINGERS IV SOLN
INTRAVENOUS | Status: DC
  Administered 2019-01-19 (×2): via INTRAVENOUS

## 2019-01-19 MED ORDER — CHLORHEXIDINE GLUCONATE CLOTH 2 % EX PADS: 6.0000 | MEDICATED_PAD | Freq: Once | CUTANEOUS | Status: DC

## 2019-01-19 MED ORDER — TECHNETIUM TC 99M SULFUR COLLOID FILTERED
1.0000 | Freq: Once | INTRAVENOUS | Status: AC | PRN
  Administered 2019-01-19: 1 via INTRADERMAL

## 2019-01-19 MED ORDER — ACETAMINOPHEN 500 MG PO TABS
1000.0000 mg | ORAL_TABLET | ORAL | Status: AC
  Administered 2019-01-19: 1000 mg via ORAL
  Filled 2019-01-19: qty 2

## 2019-01-19 MED ORDER — FENTANYL CITRATE (PF) 100 MCG/2ML IJ SOLN
50.0000 ug | Freq: Once | INTRAMUSCULAR | Status: AC
  Administered 2019-01-19: 10:00:00 50 ug via INTRAVENOUS

## 2019-01-19 MED ORDER — PROPOFOL 10 MG/ML IV BOLUS
INTRAVENOUS | Status: DC | PRN
  Administered 2019-01-19: 200 mg via INTRAVENOUS

## 2019-01-19 MED ORDER — DEXAMETHASONE SODIUM PHOSPHATE 10 MG/ML IJ SOLN
INTRAMUSCULAR | Status: AC
  Filled 2019-01-19: qty 1

## 2019-01-19 MED ORDER — ONDANSETRON HCL 4 MG/2ML IJ SOLN
INTRAMUSCULAR | Status: AC
  Filled 2019-01-19: qty 2

## 2019-01-19 MED ORDER — BUPIVACAINE HCL (PF) 0.25 % IJ SOLN
INTRAMUSCULAR | Status: DC | PRN
  Administered 2019-01-19: 13 mL

## 2019-01-19 MED ORDER — MIDAZOLAM HCL 2 MG/2ML IJ SOLN
1.0000 mg | Freq: Once | INTRAMUSCULAR | Status: AC
  Administered 2019-01-19: 10:00:00 1 mg via INTRAVENOUS

## 2019-01-19 MED ORDER — FENTANYL CITRATE (PF) 100 MCG/2ML IJ SOLN
INTRAMUSCULAR | Status: AC
  Administered 2019-01-19: 10:00:00 50 ug via INTRAVENOUS
  Filled 2019-01-19: qty 2

## 2019-01-19 MED ORDER — LIDOCAINE 2% (20 MG/ML) 5 ML SYRINGE
INTRAMUSCULAR | Status: DC | PRN
  Administered 2019-01-19: 80 mg via INTRAVENOUS

## 2019-01-19 MED ORDER — FENTANYL CITRATE (PF) 250 MCG/5ML IJ SOLN
INTRAMUSCULAR | Status: AC
  Filled 2019-01-19: qty 5

## 2019-01-19 MED ORDER — METHYLENE BLUE 0.5 % INJ SOLN
INTRAVENOUS | Status: AC
  Filled 2019-01-19: qty 10

## 2019-01-19 MED ORDER — LIDOCAINE 2% (20 MG/ML) 5 ML SYRINGE
INTRAMUSCULAR | Status: AC
  Filled 2019-01-19: qty 5

## 2019-01-19 MED ORDER — EPHEDRINE SULFATE-NACL 50-0.9 MG/10ML-% IV SOSY
PREFILLED_SYRINGE | INTRAVENOUS | Status: DC | PRN
  Administered 2019-01-19 (×2): 10 mg via INTRAVENOUS
  Administered 2019-01-19: 5 mg via INTRAVENOUS

## 2019-01-19 MED ORDER — LIDOCAINE HCL 1 % IJ SOLN
INTRAMUSCULAR | Status: DC | PRN
  Administered 2019-01-19: 40 mL via INTRAMUSCULAR

## 2019-01-19 MED ORDER — CEFAZOLIN SODIUM-DEXTROSE 2-4 GM/100ML-% IV SOLN
2.0000 g | INTRAVENOUS | Status: AC
  Administered 2019-01-19: 2 g via INTRAVENOUS
  Filled 2019-01-19: qty 100

## 2019-01-19 MED ORDER — LIDOCAINE HCL 1 % IJ SOLN
INTRAMUSCULAR | Status: AC
  Filled 2019-01-19: qty 20

## 2019-01-19 MED ORDER — FENTANYL CITRATE (PF) 100 MCG/2ML IJ SOLN
INTRAMUSCULAR | Status: DC | PRN
  Administered 2019-01-19: 50 ug via INTRAVENOUS
  Administered 2019-01-19: 25 ug via INTRAVENOUS

## 2019-01-19 MED ORDER — EPHEDRINE 5 MG/ML INJ
INTRAVENOUS | Status: AC
  Filled 2019-01-19: qty 10

## 2019-01-19 MED ORDER — ONDANSETRON HCL 4 MG/2ML IJ SOLN
INTRAMUSCULAR | Status: DC | PRN
  Administered 2019-01-19: 4 mg via INTRAVENOUS

## 2019-01-19 MED ORDER — PHENYLEPHRINE HCL-NACL 10-0.9 MG/250ML-% IV SOLN: INTRAVENOUS | Status: DC | PRN

## 2019-01-19 MED ORDER — DEXAMETHASONE SODIUM PHOSPHATE 10 MG/ML IJ SOLN
INTRAMUSCULAR | Status: DC | PRN
  Administered 2019-01-19: 4 mg via INTRAVENOUS

## 2019-01-19 MED ORDER — BUPIVACAINE LIPOSOME 1.3 % IJ SUSP
INTRAMUSCULAR | Status: DC | PRN
  Administered 2019-01-19: 10 mL via PERINEURAL

## 2019-01-19 MED ORDER — MIDAZOLAM HCL 2 MG/2ML IJ SOLN
INTRAMUSCULAR | Status: AC
  Administered 2019-01-19: 1 mg via INTRAVENOUS
  Filled 2019-01-19: qty 2

## 2019-01-19 MED ORDER — PHENYLEPHRINE 40 MCG/ML (10ML) SYRINGE FOR IV PUSH (FOR BLOOD PRESSURE SUPPORT)
PREFILLED_SYRINGE | INTRAVENOUS | Status: DC | PRN
  Administered 2019-01-19 (×2): 120 ug via INTRAVENOUS

## 2019-01-19 MED FILL — oxyCODONE HCL 5 MG TABS: 5 | 2 days supply | Qty: 8 | Fill #0

## 2019-01-19 SURGICAL SUPPLY — 44 items
BINDER BREAST LRG (GAUZE/BANDAGES/DRESSINGS) IMPLANT
BINDER BREAST XLRG (GAUZE/BANDAGES/DRESSINGS) ×3 IMPLANT
BNDG COHESIVE 4X5 TAN STRL (GAUZE/BANDAGES/DRESSINGS) ×3 IMPLANT
CANISTER SUCT 3000ML PPV (MISCELLANEOUS) ×3 IMPLANT
CHLORAPREP W/TINT 26 (MISCELLANEOUS) ×3 IMPLANT
CLIP VESOCCLUDE LG 6/CT (CLIP) ×3 IMPLANT
CLIP VESOCCLUDE MED 6/CT (CLIP) ×3 IMPLANT
CLIP VESOCCLUDE SM WIDE 6/CT (CLIP) ×3 IMPLANT
CLOSURE WOUND 1/2 X4 (GAUZE/BANDAGES/DRESSINGS) ×1
CONT SPEC 4OZ CLIKSEAL STRL BL (MISCELLANEOUS) IMPLANT
COVER PROBE W GEL 5X96 (DRAPES) ×3 IMPLANT
COVER SURGICAL LIGHT HANDLE (MISCELLANEOUS) ×3 IMPLANT
COVER WAND RF STERILE (DRAPES) ×3 IMPLANT
DERMABOND ADVANCED (GAUZE/BANDAGES/DRESSINGS) ×2
DERMABOND ADVANCED .7 DNX12 (GAUZE/BANDAGES/DRESSINGS) ×1 IMPLANT
DEVICE DUBIN SPECIMEN MAMMOGRA (MISCELLANEOUS) ×3 IMPLANT
DRAPE CHEST BREAST 15X10 FENES (DRAPES) ×3 IMPLANT
DRSG PAD ABDOMINAL 8X10 ST (GAUZE/BANDAGES/DRESSINGS) ×3 IMPLANT
ELECT COATED BLADE 2.86 ST (ELECTRODE) ×3 IMPLANT
ELECT NEEDLE BLADE 2-5/6 (NEEDLE) ×3 IMPLANT
ELECT REM PT RETURN 9FT ADLT (ELECTROSURGICAL) ×3
ELECTRODE REM PT RTRN 9FT ADLT (ELECTROSURGICAL) ×1 IMPLANT
GLOVE BIO SURGEON STRL SZ 6 (GLOVE) ×3 IMPLANT
GLOVE INDICATOR 6.5 STRL GRN (GLOVE) ×3 IMPLANT
GOWN STRL REUS W/ TWL LRG LVL3 (GOWN DISPOSABLE) ×1 IMPLANT
GOWN STRL REUS W/TWL 2XL LVL3 (GOWN DISPOSABLE) ×3 IMPLANT
GOWN STRL REUS W/TWL LRG LVL3 (GOWN DISPOSABLE) ×2
KIT BASIN OR (CUSTOM PROCEDURE TRAY) ×3 IMPLANT
KIT MARKER MARGIN INK (KITS) ×3 IMPLANT
LIGHT WAVEGUIDE WIDE FLAT (MISCELLANEOUS) ×3 IMPLANT
NEEDLE 18GX1X1/2 (RX/OR ONLY) (NEEDLE) IMPLANT
NEEDLE FILTER BLUNT 18X 1/2SAF (NEEDLE)
NEEDLE FILTER BLUNT 18X1 1/2 (NEEDLE) IMPLANT
NEEDLE HYPO 25GX1X1/2 BEV (NEEDLE) ×3 IMPLANT
NS IRRIG 1000ML POUR BTL (IV SOLUTION) ×3 IMPLANT
PACK GENERAL/GYN (CUSTOM PROCEDURE TRAY) ×3 IMPLANT
PACK UNIVERSAL I (CUSTOM PROCEDURE TRAY) ×3 IMPLANT
STOCKINETTE IMPERVIOUS 9X36 MD (GAUZE/BANDAGES/DRESSINGS) ×3 IMPLANT
STRIP CLOSURE SKIN 1/2X4 (GAUZE/BANDAGES/DRESSINGS) ×2 IMPLANT
SUT MNCRL AB 4-0 PS2 18 (SUTURE) ×3 IMPLANT
SUT VIC AB 3-0 SH 8-18 (SUTURE) ×3 IMPLANT
SYR CONTROL 10ML LL (SYRINGE) ×3 IMPLANT
TOWEL GREEN STERILE (TOWEL DISPOSABLE) ×3 IMPLANT
TOWEL GREEN STERILE FF (TOWEL DISPOSABLE) ×3 IMPLANT

## 2019-01-19 NOTE — Op Note (Signed)
Right Breast Radioactive seed localized lumpectomy and sentinel lymph node biopsy  Indications: This patient presents with history of right breast cancer, upper inner quadrant, grade 2 invasive ductal carcinoma, +/+/-, Ki 67 10%  Pre-operative Diagnosis: right breast cancer  Post-operative Diagnosis: Same  Surgeon: [name]   Anesthesia: General endotracheal anesthesia  ASA Class: 3  Procedure Details  The patient was seen in the Holding Room. The risks, benefits, complications, treatment options, and expected outcomes were discussed with the patient. The possibilities of bleeding, infection, the need for additional procedures, failure to diagnose a condition, and creating a complication requiring transfusion or operation were discussed with the patient. The patient concurred with the proposed plan, giving informed consent.  The site of surgery properly noted/marked. The patient was taken to Operating Room # 9, identified, and the procedure verified as Left Breast seed localized Lumpectomy with sentinel lymph node biopsy. A Time Out was held and the above information confirmed.  The right arm, breast, and chest were prepped and draped in standard fashion. The lumpectomy was performed by creating a superomedial circumareolar incision near the previously placed radioactive seed.  Dissection was carried down to around the point of maximum signal intensity. The cautery was used to perform the dissection.  Hemostasis was achieved with cautery. The edges of the cavity were marked with large clips, with one each medial, lateral, inferior and superior, and two clips posteriorly.   The specimen was inked with the margin marker paint kit.    Specimen radiography confirmed inclusion of the mammographic lesion, the clip, and the seed.  The background signal in the breast was zero.  The wound was irrigated and closed with 3-0 vicryl in layers and 4-0 monocryl subcuticular suture.    Using a hand-held gamma  probe, right axillary sentinel nodes were identified transcutaneously.  An oblique incision was created below the axillary hairline.  Dissection was carried through the clavipectoral fascia.  Three deep level 2 axillary sentinel nodes were removed.  Counts per second were 280, 50, and 95.    The background count was 10 cps.  The wound was irrigated.  Hemostasis was achieved with cautery.  The axillary incision was closed with a 3-0 vicryl deep dermal interrupted sutures and a 4-0 monocryl subcuticular closure.    Sterile dressings were applied. At the end of the operation, all sponge, instrument, and needle counts were correct.  Findings: grossly clear surgical margins and no adenopathy  Estimated Blood Loss:  min         Specimens: right breast lumpectomy with seed and three deep right axillary sentinel lymph nodes.             Complications:  None; patient tolerated the procedure well.         Disposition: PACU - hemodynamically stable.         Condition: stable

## 2019-01-19 NOTE — Anesthesia Procedure Notes (Signed)
Procedure Name: LMA Insertion Performed by: Milford Cage, CRNA Pre-anesthesia Checklist: Patient identified, Emergency Drugs available, Suction available and Patient being monitored Patient Re-evaluated:Patient Re-evaluated prior to induction Oxygen Delivery Method: Circle System Utilized Preoxygenation: Pre-oxygenation with 100% oxygen Induction Type: IV induction Ventilation: Mask ventilation without difficulty LMA: LMA inserted LMA Size: 4.0 Number of attempts: 1 Placement Confirmation: positive ETCO2 Tube secured with: Tape Dental Injury: Teeth and Oropharynx as per pre-operative assessment

## 2019-01-19 NOTE — Transfer of Care (Signed)
Immediate Anesthesia Transfer of Care Note  Patient: [name]  Procedure(s) Performed: RIGHT BREAST LUMPECTOMY WITH RADIOACTIVE SEED AND SENTINEL LYMPH NODE BIOPSY (Right Breast)  Patient Location: PACU  Anesthesia Type:GA combined with regional for post-op pain  Level of Consciousness: awake  Airway & Oxygen Therapy: Patient Spontanous Breathing  Post-op Assessment: Report given to RN and Post -op Vital signs reviewed and stable  Post vital signs: Reviewed and stable  Last Vitals:  Vitals Value Taken Time  BP 146/71 01/19/19 1239  Temp 36.4 C 01/19/19 1239  Pulse 110 01/19/19 1240  Resp 20 01/19/19 1240  SpO2 95 % 01/19/19 1240  Vitals shown include unvalidated device data.  Last Pain:  Vitals:   01/19/19 1020  TempSrc:   PainSc: 0-No pain      Patients Stated Pain Goal: 4 ([id number])  Complications: No apparent anesthesia complications

## 2019-01-19 NOTE — Interval H&P Note (Signed)
History and Physical Interval Note:  01/19/2019 10:00 AM  [name]  has presented today for surgery, with the diagnosis of RIGHT BREAST CANCER.  The various methods of treatment have been discussed with the patient and family. After consideration of risks, benefits and other options for treatment, the patient has consented to  Procedure(s): RIGHT BREAST LUMPECTOMY WITH RADIOACTIVE SEED AND SENTINEL LYMPH NODE BIOPSY (Right) as a surgical intervention.  The patient's history has been reviewed, patient examined, no change in status, stable for surgery.  I have reviewed the patient's chart and labs.  Questions were answered to the patient's satisfaction.     [name]

## 2019-01-19 NOTE — Anesthesia Preprocedure Evaluation (Addendum)
Anesthesia Evaluation  Patient identified by MRN, date of birth, ID band Patient awake    Reviewed: Allergy & Precautions, NPO status , Patient's Chart, lab work & pertinent test results  History of Anesthesia Complications Negative for: history of anesthetic complications  Airway Mallampati: II  TM Distance: >3 FB Neck ROM: Full    Dental no notable dental hx.    Pulmonary sleep apnea ,    Pulmonary exam normal        Cardiovascular hypertension, Pt. on medications Normal cardiovascular exam     Neuro/Psych negative neurological ROS     GI/Hepatic negative GI ROS, Neg liver ROS,   Endo/Other  negative endocrine ROS  Renal/GU negative Renal ROS  negative genitourinary   Musculoskeletal negative musculoskeletal ROS (+)   Abdominal   Peds  Hematology negative hematology ROS (+)   Anesthesia Other Findings Right breast ca  Reproductive/Obstetrics negative OB ROS                            Anesthesia Physical Anesthesia Plan  ASA: III  Anesthesia Plan: General   Post-op Pain Management: GA combined w/ Regional for post-op pain   Induction: Intravenous  PONV Risk Score and Plan: 4 or greater and Treatment may vary due to age or medical condition, Ondansetron, Dexamethasone and Midazolam  Airway Management Planned: LMA  Additional Equipment:   Intra-op Plan:   Post-operative Plan: Extubation in OR  Informed Consent: I have reviewed the patients History and Physical, chart, labs and discussed the procedure including the risks, benefits and alternatives for the proposed anesthesia with the patient or authorized representative who has indicated his/her understanding and acceptance.     Dental advisory given  Plan Discussed with: CRNA  Anesthesia Plan Comments:        Anesthesia Quick Evaluation

## 2019-01-19 NOTE — Anesthesia Postprocedure Evaluation (Signed)
Anesthesia Post Note  Patient: [name]  Procedure(s) Performed: RIGHT BREAST LUMPECTOMY WITH RADIOACTIVE SEED AND SENTINEL LYMPH NODE BIOPSY (Right Breast)     Patient location during evaluation: PACU Anesthesia Type: General Level of consciousness: awake and alert and oriented Pain management: pain level controlled Vital Signs Assessment: post-procedure vital signs reviewed and stable Respiratory status: spontaneous breathing, nonlabored ventilation and respiratory function stable Cardiovascular status: blood pressure returned to baseline Postop Assessment: no apparent nausea or vomiting Anesthetic complications: no    Last Vitals:  Vitals:   01/19/19 1239 01/19/19 1247  BP: (!) 146/71 140/69  Pulse: (!) 112 (!) 107  Resp: 16 18  Temp: 36.4 C   SpO2:  95%    Last Pain:  Vitals:   01/19/19 1247  TempSrc:   PainSc: [place]

## 2019-01-19 NOTE — Discharge Instructions (Addendum)
[facility] °Office Phone Number [phone] ° °BREAST BIOPSY/ PARTIAL MASTECTOMY: POST OP INSTRUCTIONS ° °Always review your discharge instruction sheet given to you by the facility where your surgery was performed. ° °IF YOU HAVE DISABILITY OR FAMILY LEAVE FORMS, YOU MUST BRING THEM TO THE OFFICE FOR PROCESSING.  DO NOT GIVE THEM TO YOUR DOCTOR. ° °1. A prescription for pain medication may be given to you upon discharge.  Take your pain medication as prescribed, if needed.  If narcotic pain medicine is not needed, then you may take acetaminophen (Tylenol) or ibuprofen (Advil) as needed. °2. Take your usually prescribed medications unless otherwise directed °3. If you need a refill on your pain medication, please contact your pharmacy.  They will contact our office to request authorization.  Prescriptions will not be filled after 5pm or on week-ends. °4. You should eat very light the first 24 hours after surgery, such as soup, crackers, pudding, etc.  Resume your normal diet the day after surgery. °5. Most patients will experience some swelling and bruising in the breast.  Ice packs and a good support bra will help.  Swelling and bruising can take several days to resolve.  °6. It is common to experience some constipation if taking pain medication after surgery.  Increasing fluid intake and taking a stool softener will usually help or prevent this problem from occurring.  A mild laxative (Milk of Magnesia or Miralax) should be taken according to package directions if there are no bowel movements after 48 hours. °7. Unless discharge instructions indicate otherwise, you may remove your bandages 48 hours after surgery, and you may shower at that time.  You may have steri-strips (small skin tapes) in place directly over the incision.  These strips should be left on the skin for 7-10 days.   Any sutures or staples will be removed at the office during your follow-up visit. °8. ACTIVITIES:  You may resume  regular daily activities (gradually increasing) beginning the next day.  Wearing a good support bra or sports bra (or the breast binder) minimizes pain and swelling.  You may have sexual intercourse when it is comfortable. °a. You may drive when you no longer are taking prescription pain medication, you can comfortably wear a seatbelt, and you can safely maneuver your car and apply brakes. °b. RETURN TO WORK:  __________1 week_______________ °9. You should see your doctor in the office for a follow-up appointment approximately two weeks after your surgery.  Your doctor’s nurse will typically make your follow-up appointment when she calls you with your pathology report.  Expect your pathology report 2-3 business days after your surgery.  You may call to check if you do not hear from us after three days. ° ° °WHEN TO CALL YOUR DOCTOR: °1. Fever over 101.0 °2. Nausea and/or vomiting. °3. Extreme swelling or bruising. °4. Continued bleeding from incision. °5. Increased pain, redness, or drainage from the incision. ° °The clinic staff is available to answer your questions during regular business hours.  Please don’t hesitate to call and ask to speak to one of the nurses for clinical concerns.  If you have a medical emergency, go to the nearest emergency room or call 911.  A surgeon from [facility] is always on call at the hospital. ° °For further questions, please visit [email or web address]  ° °

## 2019-01-19 NOTE — Anesthesia Procedure Notes (Signed)
Anesthesia Regional Block: Pectoralis block   Pre-Anesthetic Checklist: ,, timeout performed, Correct Patient, Correct Site, Correct Laterality, Correct Procedure, Correct Position, site marked, Risks and benefits discussed, pre-op evaluation,  At surgeon's request and post-op pain management  Laterality: Right  Prep: Maximum Sterile Barrier Precautions used, chloraprep       Needles:  Injection technique: Single-shot  Needle Type: Echogenic Stimulator Needle     Needle Length: 9cm  Needle Gauge: 22     Additional Needles:   Procedures:,,,, ultrasound used (permanent image in chart),,,,  Narrative:  Start time: 01/19/2019 10:19 AM End time: 01/19/2019 10:21 AM Injection made incrementally with aspirations every 5 mL.  Performed by: Personally  Anesthesiologist: Brennan Bailey, MD  Additional Notes: Risks, benefits, and alternative discussed. Patient gave consent for procedure. Patient prepped and draped in sterile fashion. Sedation administered, patient remains easily responsive to voice. Relevant anatomy identified with ultrasound guidance. Local anesthetic given in 5cc increments with no signs or symptoms of intravascular injection. No pain or paraesthesias with injection. Patient monitored throughout procedure with signs of LAST or immediate complications. Tolerated well. Ultrasound image placed in chart.  Tawny Asal, MD

## 2019-01-20 ENCOUNTER — Encounter (HOSPITAL_COMMUNITY): Payer: Self-pay | Admitting: General Surgery

## 2019-01-20 LAB — SURGICAL PATHOLOGY

## 2019-01-22 NOTE — Progress Notes (Signed)
Please let patient know margins and lymph nodes are negative.

## 2019-01-24 ENCOUNTER — Telehealth: Payer: Self-pay | Admitting: *Deleted

## 2019-01-24 NOTE — Telephone Encounter (Signed)
Received order for oncotype testing. Requisition faxed to pathology and [facility] °

## 2019-01-27 ENCOUNTER — Other Ambulatory Visit: Payer: Self-pay | Admitting: Family Medicine

## 2019-01-27 MED FILL — LOSARTAN-HCTZ 50-12.5 MG TA: 50-12.5 | 90 days supply | Qty: 90 | Fill #1

## 2019-01-27 MED FILL — FLUoxetine HCL 10 MG CAPS: 10 | 90 days supply | Qty: 180 | Fill #0

## 2019-02-01 DIAGNOSIS — C50211 Malignant neoplasm of upper-inner quadrant of right female breast: Secondary | ICD-10-CM | POA: Diagnosis not present

## 2019-02-01 DIAGNOSIS — Z170 Estrogen receptor positive status [ER+]: Secondary | ICD-10-CM | POA: Diagnosis not present

## 2019-02-03 ENCOUNTER — Other Ambulatory Visit: Payer: Self-pay | Admitting: *Deleted

## 2019-02-03 ENCOUNTER — Telehealth: Payer: Self-pay | Admitting: *Deleted

## 2019-02-03 DIAGNOSIS — Z170 Estrogen receptor positive status [ER+]: Secondary | ICD-10-CM

## 2019-02-03 DIAGNOSIS — C50211 Malignant neoplasm of upper-inner quadrant of right female breast: Secondary | ICD-10-CM

## 2019-02-03 NOTE — Telephone Encounter (Signed)
Received oncotype results of 18/5%.  Patient is aware.  Referral placed for Dr. Lisbeth Renshaw.

## 2019-02-06 ENCOUNTER — Telehealth: Payer: Self-pay | Admitting: Radiation Oncology

## 2019-02-06 NOTE — Telephone Encounter (Signed)
New Message: ° ° °LVM for patient to return call to schedule appt from referral received. °

## 2019-02-13 ENCOUNTER — Encounter: Payer: Self-pay | Admitting: Physical Therapy

## 2019-02-13 ENCOUNTER — Ambulatory Visit: Payer: 59 | Attending: General Surgery | Admitting: Physical Therapy

## 2019-02-13 ENCOUNTER — Other Ambulatory Visit: Payer: Self-pay

## 2019-02-13 DIAGNOSIS — R293 Abnormal posture: Secondary | ICD-10-CM | POA: Insufficient documentation

## 2019-02-13 DIAGNOSIS — Z483 Aftercare following surgery for neoplasm: Secondary | ICD-10-CM | POA: Diagnosis not present

## 2019-02-13 DIAGNOSIS — C50211 Malignant neoplasm of upper-inner quadrant of right female breast: Secondary | ICD-10-CM | POA: Insufficient documentation

## 2019-02-13 DIAGNOSIS — Z170 Estrogen receptor positive status [ER+]: Secondary | ICD-10-CM | POA: Insufficient documentation

## 2019-02-13 NOTE — Therapy (Addendum)
[facility] [address] [place], [place], [address] Phone: [phone]   Fax:  [phone]  Physical Therapy Treatment  Patient Details  Name: [name] MRN: [id number] Date of Birth: 06/12/1965 Referring Provider (PT): Dr. Faera Byerly   Encounter Date: 02/13/2019  PT End of Session - 02/13/19 1135    Visit Number  2    Number of Visits  2    PT Start Time  1055    PT Stop Time  1130    PT Time Calculation (min)  35 min    Activity Tolerance  Patient tolerated treatment well    Behavior During Therapy  WFL for tasks assessed/performed       Past Medical History:  Diagnosis Date  . Eczema    legs  . Hallux valgus 06/2012   left  . Hammertoe 06/2012   left second  . Heart murmur    since birth, has had no testing, no problems  . History of frequent ear infections   . Hypertension    under control with med., has been on med. x 3 yr.  . Irritable bowel syndrome (IBS)    no current med.  . Wears hearing aid    bilateral    Past Surgical History:  Procedure Laterality Date  . BREAST LUMPECTOMY WITH RADIOACTIVE SEED AND SENTINEL LYMPH NODE BIOPSY Right 01/19/2019   Procedure: RIGHT BREAST LUMPECTOMY WITH RADIOACTIVE SEED AND SENTINEL LYMPH NODE BIOPSY;  Surgeon: Byerly, Faera, MD;  Location: [facility] OR;  Service: General;  Laterality: Right;  . EXPLORATORY TYMPANOTOMY Left 06/25/2000   with lysis of adhesions  . HAMMER TOE SURGERY Left 06/16/2012   Procedure: LEFT SCARF MODIFIED MCBRIDE AND SECOND Metatarsal WEIL;  Surgeon: John Hewitt, MD;  Location: [facility];  Service: Orthopedics;  Laterality: Left;  . TONSILLECTOMY    . TYMPANOPLASTY Left 06/25/2000  . TYMPANOSTOMY TUBE PLACEMENT  10/10/2009    There were no vitals filed for this visit.  Subjective Assessment - 02/13/19 1105    Subjective  Patient reports she underwent a right lumpectomy and sentinel node biopsy (0/4 nodes positive) on  01/19/2019. Her Oncotype was low but she will have simulation for radiation tomorrow followed by anti-estrogen therapy.    Pertinent History  Patient was diagnosed on 12/01/2018 with right grade II invasive ductal carcinoma breast cancer. She underwent a right lumpectomy and sentinel node biopsy (0/4 nodes positive) on 01/19/2019. Her Oncotype was low but she will have simulation for radiation tomorrow followed by anti-estrogen therapy.It is ER/PR positive and HER2 negative with a Ki67 of 10%.    Patient Stated Goals  Make sure my arm is ok    Currently in Pain?  No/denies         [facility] PT Assessment - 02/13/19 0001      Assessment   Medical Diagnosis  s/p right lumpectomy and SLNB    Referring Provider (PT)  Dr. Faera Byerly    Onset Date/Surgical Date  01/19/19    Hand Dominance  Right    Prior Therapy  Baselines      Precautions   Precautions  Other (comment)    Precaution Comments  recent surgery      Restrictions   Weight Bearing Restrictions  No      Balance Screen   Has the patient fallen in the past 6 months  No    Has the patient had a decrease in activity level because of a fear   of falling?   No    Is the patient reluctant to leave their home because of a fear of falling?   No      Home Environment   Living Environment  Private residence    Living Arrangements  Spouse/significant other;Children   Husband and 31 y.o. 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 There  is mild edema present in right breast. AROM   AROM Assessment Site  Shoulder    Right/Left Shoulder  Right    Right Shoulder Extension  50 Degrees    Right Shoulder Flexion  147 Degrees    Right Shoulder ABduction  153 Degrees    Right Shoulder Internal Rotation  60 Degrees    Right Shoulder External Rotation  90 Degrees      Strength   Overall Strength  Within functional limits for tasks performed        LYMPHEDEMA/ONCOLOGY QUESTIONNAIRE - 02/13/19 1109      Type   Cancer Type  Right breast cancer      Surgeries   Lumpectomy Date  01/19/19    Sentinel Lymph Node Biopsy Date  01/19/19    Number Lymph Nodes Removed  4      Treatment   Active Chemotherapy Treatment  No    Past Chemotherapy Treatment  No    Active Radiation Treatment  No    Past Radiation Treatment  No    Current Hormone Treatment  No    Past Hormone Therapy  No      What other symptoms do you have   Are you Having Heaviness or Tightness  No    Are you having Pain  No    Are you having pitting edema  No    Is it Hard or Difficult finding clothes that fit  No    Do you have infections  No    Is there Decreased scar mobility  No    Stemmer Sign  No      Lymphedema Assessments   Lymphedema Assessments  Upper extremities      Right Upper Extremity Lymphedema   10 cm Proximal to Olecranon Process  29.2 cm    Olecranon Process  25 cm    10 cm Proximal to Ulnar Styloid Process  22.4 cm    Just Proximal to Ulnar Styloid Process  14.8 cm    Across Hand at [employer]  18.3 cm    At [place] of 2nd Digit  6.1 cm      Left Upper Extremity Lymphedema   10 cm Proximal to Olecranon Process  29.1 cm    Olecranon Process  25.6 cm    10 cm Proximal to Ulnar Styloid Process  22.9 cm    Just Proximal to Ulnar Styloid Process  15.1 cm    Across Hand at  [employer]  18.4 cm    At [place] of 2nd Digit  6.1 cm        Katina Dung - 02/13/19 0001    Open a tight or new jar  No difficulty    Do heavy household  chores (wash walls, wash floors)  No difficulty    Carry a shopping bag or briefcase  No difficulty    Wash your back  No difficulty    Use a knife to cut food  No difficulty    Recreational activities in which you take some force or impact through your arm, shoulder, or hand (golf, hammering, tennis)  No difficulty    During the past week, to what extent has your arm, shoulder or hand problem interfered with your normal social activities with family, friends, neighbors, or groups?  Not at all    During the past week, to what extent has your arm, shoulder or hand problem limited your work or other regular daily activities  Not at all    Arm, shoulder, or hand pain.  None    Tingling (pins and needles) in your arm, shoulder, or hand  None    Difficulty Sleeping  No difficulty    DASH Score  0 %                     PT Education - 02/13/19 1135    Education Details  Lymphedema risk reduction; reviewed HEP    Person(s) Educated  Patient    Methods  Explanation    Comprehension  Verbalized understanding          PT Long Term Goals - 02/13/19 1150      PT LONG TERM GOAL #1   Title  Patient will demonstrate she has regained full shoulder ROM and function post operatively compared to baselines.    Time  8    Period  Weeks    Status  Achieved            Plan - 02/13/19 1136    Clinical Impression Statement  Patient is doing very well s/p right lumpectomy and SLNB. She had 4 negative axillary nodes removed. Her Oncotype score was low but she has simulation for radiation tomorrow followed by anti-estrogen therapy. She has regained full shoulder ROM, has no signs of lymphedema, and her incisions appear to be healing well.  She will attend the After Breast Cancer class but has no other PT needs at this time.    PT Treatment/Interventions  Therapeutic exercise;Patient/family education;ADLs/Self Care Home Management    PT Next Visit Plan  D/C    PT Home Exercise Plan  Post  op shoulder ROM HEP    Consulted and Agree with Plan of Care  Patient       Patient will benefit from skilled therapeutic intervention in order to improve the following deficits and impairments:  Postural dysfunction, Decreased range of motion, Decreased knowledge of precautions, Impaired UE functional use, Pain  Visit Diagnosis: Malignant neoplasm of upper-inner quadrant of right breast in female, estrogen receptor positive (HCC)  Abnormal posture  Aftercare following surgery for neoplasm     Problem List Patient Active Problem List   Diagnosis Date Noted  . Malignant neoplasm of upper-inner quadrant of right breast in female, estrogen receptor positive ([place]) 12/13/2018  . Irritable bowel syndrome 11/30/2018  . Hypertriglyceridemia 09/30/2015  . OSA (obstructive sleep apnea) 10/25/2013  . Hearing loss 05/28/2011  . Essential hypertension 09/03/2008  .  GOITER 07/14/2007   PHYSICAL THERAPY DISCHARGE SUMMARY  Visits from Start of Care: 2  Current functional level related to goals / functional outcomes: Goals met. See above for objective measurements.   Remaining deficits: None   Education / Equipment: Post op shoulder ROM HEP and lymphedema risk reduction.  Plan: Patient agrees to discharge.  Patient goals were met. Patient is being discharged due to meeting the stated rehab goals.  ?????         Annia Friendly, [place] 02/13/19 11:52 AM  [place], [place], [address] Phone: [phone]   Fax:  [phone]  Name: [name] MRN: [id number] Date of Birth: 1966-01-05

## 2019-02-14 ENCOUNTER — Ambulatory Visit
Admission: RE | Admit: 2019-02-14 | Discharge: 2019-02-14 | Disposition: A | Discharge status: Home / Self Care | Payer: 59 | Source: Ambulatory Visit | Attending: Radiation Oncology | Admitting: Radiation Oncology

## 2019-02-14 ENCOUNTER — Encounter: Payer: Self-pay | Admitting: Radiation Oncology

## 2019-02-14 ENCOUNTER — Other Ambulatory Visit: Payer: Self-pay

## 2019-02-14 DIAGNOSIS — C50211 Malignant neoplasm of upper-inner quadrant of right female breast: Secondary | ICD-10-CM | POA: Diagnosis not present

## 2019-02-14 DIAGNOSIS — Z9889 Other specified postprocedural states: Secondary | ICD-10-CM | POA: Diagnosis not present

## 2019-02-14 DIAGNOSIS — Z170 Estrogen receptor positive status [ER+]: Secondary | ICD-10-CM | POA: Insufficient documentation

## 2019-02-14 DIAGNOSIS — Z510 Encounter for antineoplastic radiation therapy: Secondary | ICD-10-CM | POA: Insufficient documentation

## 2019-02-14 NOTE — Progress Notes (Signed)
Radiation Oncology         [phone]) [phone] ________________________________  Outpatient Follow Up - Conducted via telephone due to current COVID-19 concerns for limiting patient exposure  I spoke with the patient to conduct this consult visit via telephone to spare the patient unnecessary potential exposure in the healthcare setting during the current COVID-19 pandemic. The patient was notified in advance and was offered a [place] meeting to allow for face to face communication but unfortunately reported that they did not have the appropriate resources/technology to support such a visit and instead preferred to proceed with a telephone visit.   ________________________________  Name: LAVONA NORSWORTHY        MRN: [id number]  Date of Service: 02/14/2019 DOB: Jan 22, 1966  UX:LKGMWNUU, Rene Kocher, MD  Magrinat, Virgie Dad, MD     REFERRING PHYSICIAN: Magrinat, Virgie Dad, MD   DIAGNOSIS: The encounter diagnosis was Malignant neoplasm of upper-inner quadrant of right breast in female, estrogen receptor positive ([place]).   HISTORY OF PRESENT ILLNESS: SHEANA BIR is a 53 y.o. adult originally seen in the multidisciplinary breast clinic for a new diagnosis of right breast cancer. The patient was noted to have a screening detected distortion in the right breast and further diagnostic imaging at 1:30-2:00 revealed a 1.7 x 1.3 x 1.1 cm mass and her axilla was negative. A biopsy on 12/09/2018 revealed a grade 2 invasive ductal carcinoma and the tumor was ER/PR positive, HER2 negative with a Ki 67 of 10%. She underwent lumpectomy on 01/19/2019 which revealed a grade 2 invasive ductal carcinoma measuring 1.7 cm. 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 The specimen showed evidence of LVSI and carcinoma <66m from the anterior and medial margins.  Her 4 sampled nodes were negative for disease and her oncotype dx score was 18. Her case was discussed in conference and no additional surgery was recommended. She is contacted  today via telephone as her computer is not working for [employer].  PREVIOUS RADIATION THERAPY: No   PAST MEDICAL HISTORY:  Past Medical History:  Diagnosis Date   Eczema    legs   Hallux valgus 06/2012   left   Hammertoe 06/2012   left second   Heart murmur    since birth, has had no testing, no problems   History of frequent ear infections    Hypertension    under control with med., has been on med. x 3 yr.   Irritable bowel syndrome (IBS)    no current med.   Wears hearing aid    bilateral       PAST SURGICAL HISTORY: Past Surgical History:  Procedure Laterality Date   BREAST LUMPECTOMY WITH RADIOACTIVE SEED AND SENTINEL LYMPH NODE BIOPSY Right 01/19/2019   Procedure: RIGHT BREAST LUMPECTOMY WITH RADIOACTIVE SEED AND SENTINEL LYMPH NODE BIOPSY;  Surgeon: BStark Klein MD;  [facility]  Service: General;  Laterality: Right;   EXPLORATORY TYMPANOTOMY Left 06/25/2000   with lysis of adhesions   HAMMER TOE SURGERY Left 06/16/2012   Procedure: [place]  Surgeon: JWylene Simmer MD;  Location[facility]  Service: Orthopedics;  Laterality: Left;   TONSILLECTOMY     TYMPANOPLASTY Left 06/25/2000   TYMPANOSTOMY TUBE PLACEMENT  10/10/2009     FAMILY HISTORY:  Family History  Problem Relation Age of Onset   Alzheimer's disease Mother    Brain cancer Father        Glioblastoma multiforma.    Ovarian cancer  Paternal Aunt      SOCIAL HISTORY:  reports that she has never smoked. She has never used smokeless tobacco. She reports current alcohol use. She reports that she does not use drugs.  The patient is married and lives in [place]. 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 She has a son who's a high school senior at home and a daughter and granddaughter who visit often.   ALLERGIES: Erythromycin, Nitrofurantoin, Lexapro [escitalopram oxalate], and  Lisinopril   MEDICATIONS:  Current Outpatient Medications  Medication Sig Dispense Refill   FLUoxetine (PROZAC) 10 MG capsule TAKE 2 CAPSULES BY MOUTH DAILY 180 capsule 1   losartan-hydrochlorothiazide (HYZAAR) 50-12.5 MG tablet Take 1 tablet by mouth daily. 90 tablet 1   oxyCODONE (OXY IR/ROXICODONE) 5 MG immediate release tablet Take 1 tablet (5 mg total) by mouth every 6 (six) hours as needed for severe pain. 8 tablet 0   No current facility-administered medications for this encounter.      REVIEW OF SYSTEMS: On review of systems, the patient reports that she is doing well overall. She was told she may have a seroma on examination yesterday with Dr. Barry Dienes. She is still willing to come in today to see what we think about proceeding. She denies any chest pain, shortness of breath, cough, fevers, chills, night sweats, unintended weight changes. She denies any bowel or bladder disturbances, and denies abdominal pain, nausea or vomiting. She denies any new musculoskeletal or joint aches or pains. A complete review of systems is obtained and is otherwise negative.     PHYSICAL EXAM: Unable to assess  due to encounter type.    ECOG = 0  0 - Asymptomatic (Fully active, able to carry on all predisease activities without restriction)  1 - Symptomatic but completely ambulatory (Restricted in physically strenuous activity but ambulatory and able to carry out work of a light or sedentary nature. For example, light housework, office work)  2 - Symptomatic, <50% in bed during the day (Ambulatory and capable of all self care but unable to carry out any work activities. Up and about more than 50% of waking hours)  3 - Symptomatic, >50% in bed, but not bedbound (Capable of only limited self-care, confined to bed or chair 50% or more of waking hours)  4 - Bedbound (Completely disabled. Cannot carry on any self-care. Totally confined to bed or chair)  5 - Death   Eustace Pen MM, Creech RH, Tormey  DC, et al. [phone]). "Toxicity and response criteria of the [facility]". [place] Oncol. 5 (6): 649-55    LABORATORY DATA:  Lab Results  Component Value Date   WBC 9.6 12/21/2018   HGB 14.4 01/13/2019   HCT 45.6 12/21/2018   MCV 86.0 12/21/2018   PLT 337 12/21/2018   Lab Results  Component Value Date   NA 140 01/13/2019   K 3.8 01/13/2019   CL 109 01/13/2019   CO2 20 (L) 01/13/2019   Lab Results  Component Value Date   ALT 23 12/21/2018   AST 28 12/21/2018   ALKPHOS 100 12/21/2018   BILITOT 0.5 12/21/2018      RADIOGRAPHY: Nm Sentinel Node Inj-no Rpt (breast)  Result Date: 01/19/2019 Sulfur colloid was injected by the nuclear medicine technologist for melanoma sentinel node.   Mm Breast Surgical Specimen  Result Date: 01/19/2019 CLINICAL DATA:  Right breast cancer status post lumpectomy EXAM: SPECIMEN RADIOGRAPH OF THE right BREAST COMPARISON:  Previous exam(s).  FINDINGS: Status post excision of the right breast. The radioactive seed and biopsy marker clip are present, completely intact, and were marked for pathology. IMPRESSION: Specimen radiograph of the right breast. Electronically Signed   By: Abelardo Diesel M.D.   On: 01/19/2019 11:21   [place] Rt Radioactive Seed Loc  Result Date: 01/18/2019 CLINICAL DATA:  53 year old patient presents for radioactive seed localization of a biopsy-proven upper inner quadrant right breast cancer prior to lumpectomy. EXAM: ULTRASOUND GUIDED RADIOACTIVE SEED LOCALIZATION OF THE RIGHT BREAST COMPARISON:  Previous exam(s). FINDINGS: Patient presents for radioactive seed localization prior to lumpectomy. I met with the patient and we discussed the procedure of seed localization including benefits and alternatives. We discussed the high likelihood of a successful procedure. We discussed the risks of the procedure including infection, bleeding, tissue injury and further surgery. We discussed the low dose of radioactivity  involved in the procedure. Informed, written consent was given. The usual time-out protocol was performed immediately prior to the procedure. Using ultrasound guidance, sterile technique, 1% lidocaine and an I-125 radioactive seed, hypoechoic irregular mass with internal clip artifact at 1:30 position 8 cm from the nipple was localized using a lateral approach. The follow-up mammogram images confirm the seed in the expected location and were marked for Dr. Barry Dienes. Follow-up survey of the patient confirms presence of the radioactive seed. Order number of I-125 seed:  [id number]. Total activity: 0.244 mCi reference Date: 02 January 2019 The patient tolerated the procedure well and was released from the [place]. She was given instructions regarding seed removal. IMPRESSION: Radioactive seed localization right breast. No apparent complications. Electronically Signed   By: Curlene Dolphin M.D.   On: 01/18/2019 15:06   Mm Clip Placement Right  Result Date: 01/18/2019 CLINICAL DATA:  Radioactive seed localization was performed of a biopsy-proven cancer in the upper inner quadrant of the right breast. EXAM: DIAGNOSTIC right MAMMOGRAM POST ULTRASOUND-GUIDED RADIOACTIVE SEED PLACEMENT COMPARISON:  Previous exam(s). FINDINGS: Mammographic images were obtained following ultrasound-guided radioactive seed placement. These demonstrate radioactive seed within the biopsied mass in the upper inner quadrant. IMPRESSION: Appropriate location of the radioactive seed. Final Assessment: Post Procedure Mammograms for Seed Placement Electronically Signed   By: Curlene Dolphin M.D.   On: 01/18/2019 15:17       IMPRESSION/PLAN: 1. 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 Dr. Lisbeth Renshaw discusses the final pathology findings and reviews the nature of invasive breast disease. The patient is doing well and does not need systemic therapy given her low oncotype dx score. She is ready to  proceed with adjuvant external radiotherapy to the breast followed by antiestrogen therapy. We discussed the risks, benefits, short, and long term effects of radiotherapy, and the patient is interested in proceeding. Dr. Lisbeth Renshaw discusses the delivery and logistics of radiotherapy and anticipates a course of 6 1/2 weeks of radiotherapy. She will come in today to consider simulation. If her seroma is problematic we can delay simulation as well.  2. Contraceptive Counseling. She is still having cycles though intermittent. For this reason she will have a urine hcg test today prior to simulation. She does state she has been using condoms and had a difficult time with conceiving when she was younger and for these reasons declines urine Hcg testing.   Given current concerns for patient exposure during the COVID-19 pandemic, this encounter was conducted via telephone.  The patient has given verbal consent for this type of encounter. The time spent  during this encounter was 25 minutes and 50% of that time was spent in the coordination of her care. The attendants for this meeting include Dr. Lisbeth Renshaw and Shona Simpson, [facility] and [name]  During the encounter, Dr. Lisbeth Renshaw and Shona Simpson [facility] were located at [facility] Radiation Oncology Department.  JAYDENCE VANYO  was located at home.  The above documentation reflects my direct findings during this shared patient visit. Please see the separate note by Dr. Lisbeth Renshaw on this date for the remainder of the patient's plan of care.    Carola Rhine, PAC

## 2019-02-14 NOTE — Progress Notes (Signed)
Attempted MY Chart video with patient but her computer kept freezing. Finished her meaningful use via telephone. Questions and concerns addressed. She is aware she needs to come in early for labs prior to CT Sim.

## 2019-02-15 ENCOUNTER — Encounter: Payer: Self-pay | Admitting: Oncology

## 2019-02-15 ENCOUNTER — Telehealth: Payer: Self-pay | Admitting: Radiation Oncology

## 2019-02-15 NOTE — Telephone Encounter (Signed)
I called to let her know that Dr. Barry Dienes would like to have her seen in the work in clinic to consider aspiration and PT wants her to wear a compression bra with foam insert following for a few days. She does not have the foam insert and I have reached back out to PT to see if that can be obtained for her. Otherwise we will see her in a few weeks to reconsider simulation after her seroma improves.

## 2019-02-16 DIAGNOSIS — G4733 Obstructive sleep apnea (adult) (pediatric): Secondary | ICD-10-CM | POA: Diagnosis not present

## 2019-02-22 ENCOUNTER — Ambulatory Visit: Payer: 59 | Admitting: Radiation Oncology

## 2019-02-23 ENCOUNTER — Ambulatory Visit: Payer: 59

## 2019-02-24 ENCOUNTER — Ambulatory Visit: Payer: 59

## 2019-02-27 ENCOUNTER — Encounter: Payer: Self-pay | Admitting: *Deleted

## 2019-02-27 ENCOUNTER — Ambulatory Visit: Payer: 59

## 2019-02-28 ENCOUNTER — Ambulatory Visit
Admission: RE | Admit: 2019-02-28 | Discharge: 2019-02-28 | Disposition: A | Discharge status: Home / Self Care | Payer: 59 | Source: Ambulatory Visit | Attending: Radiation Oncology | Admitting: Radiation Oncology

## 2019-02-28 ENCOUNTER — Other Ambulatory Visit: Payer: Self-pay

## 2019-02-28 ENCOUNTER — Ambulatory Visit: Payer: 59

## 2019-02-28 DIAGNOSIS — Z510 Encounter for antineoplastic radiation therapy: Secondary | ICD-10-CM | POA: Diagnosis not present

## 2019-02-28 DIAGNOSIS — C50211 Malignant neoplasm of upper-inner quadrant of right female breast: Secondary | ICD-10-CM | POA: Diagnosis not present

## 2019-02-28 DIAGNOSIS — Z170 Estrogen receptor positive status [ER+]: Secondary | ICD-10-CM | POA: Diagnosis not present

## 2019-03-01 ENCOUNTER — Encounter: Payer: Self-pay | Admitting: *Deleted

## 2019-03-01 ENCOUNTER — Ambulatory Visit: Payer: 59

## 2019-03-01 NOTE — Progress Notes (Signed)
  Radiation Oncology         [phone] ________________________________  Name: [name] MRN: [id number]  Date: 02/28/2019  DOB: 1965/05/31  DIAGNOSIS:     ICD-10-CM   1. Malignant neoplasm of upper-inner quadrant of right breast in female, estrogen receptor positive ([place])  C50.211    Z17.0      SIMULATION AND TREATMENT PLANNING NOTE  The patient presented for simulation prior to beginning her course of radiation treatment for her diagnosis of right-sided breast cancer. The patient was placed in a supine position on a breast board. A customized vac-lock bag was constructed and this complex treatment device will be used on a daily basis during her treatment. In this fashion, a CT scan was obtained through the chest area and an isocenter was placed near the chest wall within the breast.  The patient will be planned to receive a course of radiation initially to a dose of 50 Gy. This will consist of a whole breast radiotherapy technique. To accomplish this, 2 customized blocks have been designed which will correspond to medial and lateral whole breast tangent fields. This treatment will be accomplished at  Gy per fraction. A forward planning technique will also be evaluated to determine if this approach improves the plan. It is anticipated that the patient will then receive a 14.4 Gy boost to the seroma cavity which has been contoured. This will be accomplished at 1.8 Gy per fraction.   This initial treatment will consist of a 3-D conformal technique. The seroma has been contoured as the primary target structure. Additionally, dose volume histograms of both this target as well as the lungs and heart will also be evaluated. Such an approach is necessary to ensure that the target area is adequately covered while the nearby critical  normal structures are adequately spared.  Plan:  The final anticipated total dose therefore will correspond to 64.4  Gy.    _______________________________   Jodelle Gross, MD, PhD

## 2019-03-01 NOTE — Progress Notes (Signed)
  Radiation Oncology         [phone] ________________________________  Name: [name] MRN: [id number]  Date: 02/28/2019  DOB: 03-12-1965  Optical Surface Tracking Plan:  Since intensity modulated radiotherapy (IMRT) and 3D conformal radiation treatment methods are predicated on accurate and precise positioning for treatment, intrafraction motion monitoring is medically necessary to ensure accurate and safe treatment delivery.  The ability to quantify intrafraction motion without excessive ionizing radiation dose can only be performed with optical surface tracking. Accordingly, surface imaging offers the opportunity to obtain 3D measurements of patient position throughout IMRT and 3D treatments without excessive radiation exposure.  I am ordering optical surface tracking for this patient's upcoming course of radiotherapy. ________________________________  Kyung Rudd, MD 03/01/2019 11:30 AM    Reference:   Ursula Alert, J, et al. Surface imaging-based analysis of intrafraction motion for breast radiotherapy patients.Journal of [place], n. 6, nov. 2014. ISSN [id number].   Available at: <http://www.jacmp.org/index.php/jacmp/article/view/4957>.

## 2019-03-02 ENCOUNTER — Ambulatory Visit: Payer: 59

## 2019-03-06 ENCOUNTER — Ambulatory Visit: Payer: 59

## 2019-03-06 DIAGNOSIS — Z170 Estrogen receptor positive status [ER+]: Secondary | ICD-10-CM | POA: Diagnosis not present

## 2019-03-06 DIAGNOSIS — Z510 Encounter for antineoplastic radiation therapy: Secondary | ICD-10-CM | POA: Diagnosis not present

## 2019-03-06 DIAGNOSIS — C50211 Malignant neoplasm of upper-inner quadrant of right female breast: Secondary | ICD-10-CM | POA: Diagnosis not present

## 2019-03-07 ENCOUNTER — Other Ambulatory Visit: Payer: Self-pay

## 2019-03-07 ENCOUNTER — Ambulatory Visit
Admission: RE | Admit: 2019-03-07 | Discharge: 2019-03-07 | Disposition: A | Discharge status: Home / Self Care | Payer: 59 | Source: Ambulatory Visit | Attending: Radiation Oncology | Admitting: Radiation Oncology

## 2019-03-07 DIAGNOSIS — C50211 Malignant neoplasm of upper-inner quadrant of right female breast: Secondary | ICD-10-CM | POA: Diagnosis not present

## 2019-03-07 DIAGNOSIS — Z510 Encounter for antineoplastic radiation therapy: Secondary | ICD-10-CM | POA: Diagnosis not present

## 2019-03-07 DIAGNOSIS — Z170 Estrogen receptor positive status [ER+]: Secondary | ICD-10-CM | POA: Diagnosis not present

## 2019-03-08 ENCOUNTER — Other Ambulatory Visit: Payer: Self-pay

## 2019-03-08 ENCOUNTER — Ambulatory Visit
Admission: RE | Admit: 2019-03-08 | Discharge: 2019-03-08 | Disposition: A | Discharge status: Home / Self Care | Payer: 59 | Source: Ambulatory Visit | Attending: Radiation Oncology | Admitting: Radiation Oncology

## 2019-03-08 DIAGNOSIS — Z510 Encounter for antineoplastic radiation therapy: Secondary | ICD-10-CM | POA: Diagnosis not present

## 2019-03-08 DIAGNOSIS — Z170 Estrogen receptor positive status [ER+]: Secondary | ICD-10-CM | POA: Diagnosis not present

## 2019-03-08 DIAGNOSIS — C50211 Malignant neoplasm of upper-inner quadrant of right female breast: Secondary | ICD-10-CM | POA: Diagnosis not present

## 2019-03-09 ENCOUNTER — Ambulatory Visit
Admission: RE | Admit: 2019-03-09 | Discharge: 2019-03-09 | Disposition: A | Discharge status: Home / Self Care | Payer: 59 | Source: Ambulatory Visit | Attending: Radiation Oncology | Admitting: Radiation Oncology

## 2019-03-09 ENCOUNTER — Other Ambulatory Visit: Payer: Self-pay

## 2019-03-09 DIAGNOSIS — C50211 Malignant neoplasm of upper-inner quadrant of right female breast: Secondary | ICD-10-CM

## 2019-03-09 DIAGNOSIS — Z510 Encounter for antineoplastic radiation therapy: Secondary | ICD-10-CM | POA: Diagnosis not present

## 2019-03-09 DIAGNOSIS — Z170 Estrogen receptor positive status [ER+]: Secondary | ICD-10-CM | POA: Diagnosis not present

## 2019-03-09 MED ORDER — SONAFINE EX EMUL
1.0000 "application " | Freq: Two times a day (BID) | CUTANEOUS | Status: DC
  Administered 2019-03-09: 1 via TOPICAL

## 2019-03-09 MED ORDER — ALRA NON-METALLIC DEODORANT (RAD-ONC)
1.0000 "application " | Freq: Once | TOPICAL | Status: AC
  Administered 2019-03-09: 1 via TOPICAL

## 2019-03-13 ENCOUNTER — Other Ambulatory Visit: Payer: Self-pay

## 2019-03-13 ENCOUNTER — Ambulatory Visit
Admission: RE | Admit: 2019-03-13 | Discharge: 2019-03-13 | Disposition: A | Discharge status: Home / Self Care | Payer: 59 | Source: Ambulatory Visit | Attending: Radiation Oncology | Admitting: Radiation Oncology

## 2019-03-13 DIAGNOSIS — Z510 Encounter for antineoplastic radiation therapy: Secondary | ICD-10-CM | POA: Insufficient documentation

## 2019-03-13 DIAGNOSIS — Z170 Estrogen receptor positive status [ER+]: Secondary | ICD-10-CM | POA: Insufficient documentation

## 2019-03-13 DIAGNOSIS — C50211 Malignant neoplasm of upper-inner quadrant of right female breast: Secondary | ICD-10-CM | POA: Insufficient documentation

## 2019-03-14 ENCOUNTER — Ambulatory Visit
Admission: RE | Admit: 2019-03-14 | Discharge: 2019-03-14 | Disposition: A | Discharge status: Home / Self Care | Payer: 59 | Source: Ambulatory Visit | Attending: Radiation Oncology | Admitting: Radiation Oncology

## 2019-03-14 ENCOUNTER — Other Ambulatory Visit: Payer: Self-pay

## 2019-03-14 DIAGNOSIS — Z170 Estrogen receptor positive status [ER+]: Secondary | ICD-10-CM | POA: Diagnosis not present

## 2019-03-14 DIAGNOSIS — Z510 Encounter for antineoplastic radiation therapy: Secondary | ICD-10-CM | POA: Diagnosis not present

## 2019-03-14 DIAGNOSIS — C50211 Malignant neoplasm of upper-inner quadrant of right female breast: Secondary | ICD-10-CM | POA: Diagnosis not present

## 2019-03-15 ENCOUNTER — Ambulatory Visit
Admission: RE | Admit: 2019-03-15 | Discharge: 2019-03-15 | Disposition: A | Discharge status: Home / Self Care | Payer: 59 | Source: Ambulatory Visit | Attending: Radiation Oncology | Admitting: Radiation Oncology

## 2019-03-15 ENCOUNTER — Other Ambulatory Visit: Payer: Self-pay

## 2019-03-15 DIAGNOSIS — Z510 Encounter for antineoplastic radiation therapy: Secondary | ICD-10-CM | POA: Diagnosis not present

## 2019-03-15 DIAGNOSIS — C50211 Malignant neoplasm of upper-inner quadrant of right female breast: Secondary | ICD-10-CM | POA: Diagnosis not present

## 2019-03-15 DIAGNOSIS — Z170 Estrogen receptor positive status [ER+]: Secondary | ICD-10-CM | POA: Diagnosis not present

## 2019-03-16 ENCOUNTER — Other Ambulatory Visit: Payer: Self-pay

## 2019-03-16 ENCOUNTER — Ambulatory Visit
Admission: RE | Admit: 2019-03-16 | Discharge: 2019-03-16 | Disposition: A | Discharge status: Home / Self Care | Payer: 59 | Source: Ambulatory Visit | Attending: Radiation Oncology | Admitting: Radiation Oncology

## 2019-03-16 DIAGNOSIS — C50211 Malignant neoplasm of upper-inner quadrant of right female breast: Secondary | ICD-10-CM | POA: Diagnosis not present

## 2019-03-16 DIAGNOSIS — Z510 Encounter for antineoplastic radiation therapy: Secondary | ICD-10-CM | POA: Diagnosis not present

## 2019-03-16 DIAGNOSIS — Z170 Estrogen receptor positive status [ER+]: Secondary | ICD-10-CM | POA: Diagnosis not present

## 2019-03-17 ENCOUNTER — Ambulatory Visit
Admission: RE | Admit: 2019-03-17 | Discharge: 2019-03-17 | Disposition: A | Discharge status: Home / Self Care | Payer: 59 | Source: Ambulatory Visit | Attending: Radiation Oncology | Admitting: Radiation Oncology

## 2019-03-17 ENCOUNTER — Other Ambulatory Visit: Payer: Self-pay

## 2019-03-17 DIAGNOSIS — C50211 Malignant neoplasm of upper-inner quadrant of right female breast: Secondary | ICD-10-CM | POA: Diagnosis not present

## 2019-03-17 DIAGNOSIS — Z170 Estrogen receptor positive status [ER+]: Secondary | ICD-10-CM | POA: Diagnosis not present

## 2019-03-17 DIAGNOSIS — Z510 Encounter for antineoplastic radiation therapy: Secondary | ICD-10-CM | POA: Diagnosis not present

## 2019-03-20 ENCOUNTER — Ambulatory Visit
Admission: RE | Admit: 2019-03-20 | Discharge: 2019-03-20 | Disposition: A | Discharge status: Home / Self Care | Payer: 59 | Source: Ambulatory Visit | Attending: Radiation Oncology | Admitting: Radiation Oncology

## 2019-03-20 ENCOUNTER — Other Ambulatory Visit: Payer: Self-pay

## 2019-03-20 DIAGNOSIS — Z170 Estrogen receptor positive status [ER+]: Secondary | ICD-10-CM | POA: Diagnosis not present

## 2019-03-20 DIAGNOSIS — Z510 Encounter for antineoplastic radiation therapy: Secondary | ICD-10-CM | POA: Diagnosis not present

## 2019-03-20 DIAGNOSIS — C50211 Malignant neoplasm of upper-inner quadrant of right female breast: Secondary | ICD-10-CM | POA: Diagnosis not present

## 2019-03-20 NOTE — Progress Notes (Signed)
[name]  [phone] [phone]     ID: [name] DOB: 1965/06/07  MR#: [id number]  GXQ#:[id number]  Patient Care Team: Hali Marry, MD as PCP - General Louretta Shorten, MD (Obstetrics and Gynecology) Mauro Kaufmann, RN as Oncology Nurse Navigator Rockwell Germany, RN as Oncology Nurse Navigator [name], MD as Consulting Physician (General Surgery) Paislie Tessler, Virgie Dad, MD as Consulting Physician (Oncology) Kyung Rudd, MD as Consulting Physician (Radiation Oncology) Chauncey Cruel, MD OTHER MD:  CHIEF COMPLAINT: Estrogen receptor positive breast cancer  CURRENT TREATMENT: Adjuvant radiation therapy   INTERVAL HISTORY: Judy Luis" returns today for follow up of her estrogen receptor positive breast cancer.  She underwent right lumpectomy and sentinel lymph node biopsy on 01/19/2019 under Dr. Barry Dienes. Pathology from the procedure [id number]) revealed: invasive ductal carcinoma, grade 2, 1.7 cm; high grade ductal carcinoma in situ; carcinoma focally less than 1 mm from anterior and medial margins; lymphovascular invasion present; negative for perineural invasion.  A total of 4 lymph nodes were biopsied; all were negative for carcinoma (0/4).  Oncotype DX was obtained on the final surgical sample and the recurrence score of 18 predicts a risk of recurrence outside the breast over the next 9 years of 5%, if the patient's only systemic therapy is an antiestrogen for 5 years.  It also predicts no significant benefit from chemotherapy.  She was referred back to Dr. Lisbeth Renshaw on 02/14/2019 to discuss adjuvant radiation therapy. She began treatment on 03/07/2019 and is scheduled through 04/21/2019.  She was supposed to have been referred to genetics but for some reason that did not occur  REVIEW OF SYSTEMS: Judy Grinder" did very well with her surgery, with no unusual pain bleeding or fever.  She is tolerating radiation well so  far.  She is continuing to work from home.  She and her family are taking appropriate pandemic precautions.  For exercise she walks 20 to 30 minutes most days.  A detailed review of systems today was otherwise stable   HISTORY OF CURRENT ILLNESS: From the original intake note:  [name] had routine screening mammography on 12/05/2018 showing a possible abnormality in the right breast. She underwent right diagnostic mammography with tomography and right breast ultrasonography at [facility] on 12/08/2018 showing: breast density category C; 1.7 cm irregular mass with associated distortion in the right breast at 1:30; right axilla negative for lymphadenopathy.   Accordingly on 12/09/2018 she proceeded to biopsy of the right breast area in question. The pathology from this procedure ([id number]) showed: invasive ductal carcinoma, grade 2; ductal carcinoma in situ, intermediate grade. Prognostic indicators significant for: estrogen receptor, 90% positive and progesterone receptor, 95% positive, both with strong staining intensity. Proliferation marker Ki67 at 10%. HER2 equivocal by immunohistochemistry (2+), but negative by fluorescent in situ hybridization with a signals ratio 1.32 and number per cell 2.25.  The patient's subsequent history is as detailed below.   PAST MEDICAL HISTORY: Past Medical History:  Diagnosis Date  . Eczema    legs  . Hallux valgus 06/2012   left  . Hammertoe 06/2012   left second  . Heart murmur    since birth, has had no testing, no problems  . History of frequent ear infections   . Hypertension    under control with med., has been on med. x 3 yr.  . Irritable bowel syndrome (IBS)    no current med.  . Wears hearing aid  bilateral    PAST SURGICAL HISTORY: Past Surgical History:  Procedure Laterality Date  . BREAST LUMPECTOMY WITH RADIOACTIVE SEED AND SENTINEL LYMPH NODE BIOPSY Right 01/19/2019   Procedure: RIGHT BREAST LUMPECTOMY WITH  RADIOACTIVE SEED AND SENTINEL LYMPH NODE BIOPSY;  Surgeon: [name], MD;  Location: [facility];  Service: General;  Laterality: Right;  . EXPLORATORY TYMPANOTOMY Left 06/25/2000   with lysis of adhesions  . HAMMER TOE SURGERY Left 06/16/2012   Procedure: LEFT SCARF MODIFIED MCBRIDE AND SECOND Metatarsal WEIL;  Surgeon: Wylene Simmer, MD;  Location: [facility];  Service: Orthopedics;  Laterality: Left;  . TONSILLECTOMY    . TYMPANOPLASTY Left 06/25/2000  . TYMPANOSTOMY TUBE PLACEMENT  10/10/2009    FAMILY HISTORY: Family History  Problem Relation Age of Onset  . Alzheimer's disease Mother   . Brain cancer Father        Glioblastoma multiforma.   . Ovarian cancer Paternal 62    Patient's father was 2 years old when he died from [place], diagnosed age 23.. Patient's mother died at age 29. The patient notes a family hx of breast and ovarian cancer. A paternal aunt was diagnosed with ovarian cancer (age unsure) and a maternal cousin with breast cancer (age unknown).  The patient has 3 brothers, no sisters.    GYNECOLOGIC HISTORY:  No LMP recorded. Menarche: 54 years old Age at first live birth: 54 years old [place] P 2 LMP 12/14/2018, having regular periods Contraceptive: yes, used for 29 years, stopped 12/2018; currently barrier methods HRT n/a  Hysterectomy? no BSO? no   SOCIAL HISTORY: (updated 12/2018)  Judy Millin "[name]" is currently working as an Investment banker, corporate for [employer]. Husband [name] is a Designer, jewellery. She lives at home with her husband and son. Daughter [name], age 68, is a Programme researcher, broadcasting/film/video at [employer] here in [place]. Son [name], age 7, is a high Public affairs consultant.  The patient attends [employer] in [place].    ADVANCED DIRECTIVES: not in place.  In the absence of any documents to the contrary husband [name] is automatically her HCPOA.   HEALTH MAINTENANCE: Social History   Tobacco Use  . Smoking status: Never Smoker  . Smokeless tobacco:  Never Used  Substance Use Topics  . Alcohol use: Yes    Comment: occasionally  . Drug use: No     Colonoscopy: never done  PAP: 12/01/2018, negative  Bone density: not on file, due for repeat 01/2019   Allergies  Allergen Reactions  . Erythromycin Nausea And Vomiting    FACIAL NUMBNESS  . Nitrofurantoin Other (See Comments)    GI UPSET  . Lexapro [Escitalopram Oxalate] Nausea Only  . Lisinopril Cough    Current Outpatient Medications  Medication Sig Dispense Refill  . FLUoxetine (PROZAC) 10 MG capsule TAKE 2 CAPSULES BY MOUTH DAILY 180 capsule 1  . losartan-hydrochlorothiazide (HYZAAR) 50-12.5 MG tablet Take 1 tablet by mouth daily. 90 tablet 1   No current facility-administered medications for this visit.    OBJECTIVE: Middle-aged white woman who appears stated age  38:   03/21/19 1305  BP: 128/77  Pulse: 95  Resp: 18  Temp: 97.8 F (36.6 C)  SpO2: 99%     Body mass index is 30.59 kg/m.   Wt Readings from Last 3 Encounters:  03/21/19 161 lb 14.4 oz (73.4 kg)  01/19/19 162 lb 9 oz (73.7 kg)  01/13/19 162 lb 9 oz (73.7 kg)      ECOG FS:1 - Symptomatic but  completely ambulatory  Sclerae unicteric, EOMs intact Wearing a mask No cervical or supraclavicular adenopathy Lungs no rales or rhonchi Heart regular rate and rhythm Abd soft, nontender, positive bowel sounds MSK no focal spinal tenderness, no upper extremity lymphedema Neuro: nonfocal, well oriented, appropriate affect Breasts: The right breast is status post lumpectomy and it is currently undergoing radiation.  The cosmetic result is excellent.  There is no erythema.  Left breast is benign.  Both axillae are benign.   LAB RESULTS:  CMP     Component Value Date/Time   NA 140 01/13/2019 1141   K 3.8 01/13/2019 1141   CL 109 01/13/2019 1141   CO2 20 (L) 01/13/2019 1141   GLUCOSE 120 (H) 01/13/2019 1141   BUN 14 01/13/2019 1141   CREATININE 1.03 (H) 01/13/2019 1141   CREATININE 1.03 (H)  12/21/2018 1220   CREATININE 1.03 04/14/2018 1051   CALCIUM 9.1 01/13/2019 1141   PROT 7.5 12/21/2018 1220   ALBUMIN 3.9 12/21/2018 1220   AST 28 12/21/2018 1220   ALT 23 12/21/2018 1220   ALKPHOS 100 12/21/2018 1220   BILITOT 0.5 12/21/2018 1220   GFRNONAA >60 01/13/2019 1141   GFRNONAA >60 12/21/2018 1220   GFRNONAA 62 04/14/2018 1051   GFRAA >60 01/13/2019 1141   GFRAA >60 12/21/2018 1220   GFRAA 72 04/14/2018 1051    No results found for: TOTALPROTELP, ALBUMINELP, A1GS, A2GS, BETS, BETA2SER, GAMS, MSPIKE, SPEI  No results found for: KPAFRELGTCHN, LAMBDASER, KAPLAMBRATIO  Lab Results  Component Value Date   WBC 9.6 12/21/2018   NEUTROABS 7.2 12/21/2018   HGB 14.4 01/13/2019   HCT 45.6 12/21/2018   MCV 86.0 12/21/2018   PLT 337 12/21/2018   No results found for: LABCA2  No components found for: NWGNFA213  No results for input(s): INR in the last 168 hours.  No results found for: LABCA2  No results found for: YQM578  No results found for: ION629  No results found for: BMW413  No results found for: CA2729  No components found for: HGQUANT  No results found for: CEA1 / No results found for: CEA1   No results found for: AFPTUMOR  No results found for: CHROMOGRNA  No results found for: HGBA, HGBA2QUANT, HGBFQUANT, HGBSQUAN (Hemoglobinopathy evaluation)   No results found for: LDH  No results found for: IRON, TIBC, IRONPCTSAT (Iron and TIBC)  No results found for: FERRITIN  Urinalysis No results found for: COLORURINE, APPEARANCEUR, LABSPEC, PHURINE, GLUCOSEU, HGBUR, BILIRUBINUR, KETONESUR, PROTEINUR, UROBILINOGEN, NITRITE, LEUKOCYTESUR   STUDIES: No results found.    ELIGIBLE FOR AVAILABLE RESEARCH PROTOCOL: [place]  ASSESSMENT: 75 y.[place], [place] woman status post right breast upper inner quadrant biopsy 12/09/2018 for a clinical T1c N0, stage IA invasive ductal carcinoma, grade 2, estrogen and progesterone receptor positive, HER-2  nonamplified, with an MIB-1 of 10%  (1) status post right lumpectomy and sentinel lymph node sampling 01/19/2019 for a pT1c pN0, stage Ia invasive ductal carcinoma, grade 2, with close but negative margins and evidence of lymphovascular invasion  (a) a total of 4 right axillary lymph nodes were removed  (2) Oncotype score of 18 predicts a risk of recurrence outside the breast over the next 9 years of 5% if the patient's only systemic therapy is antiestrogens for 5 years.  It also predicts no benefit from chemotherapy.  (3) adjuvant radiation in process  (4) tamoxifen to start 05/08/2019  (5) genetics testing pending.   PLAN: [name] did very well with her surgery and  so far is doing well with radiation.  This will complete her local treatment.  As far as systemic treatment is concerned she understands she has an excellent prognosis without chemotherapy.  Her only systemic therapy will be antiestrogens for 5 years.  We discussed some of the differences between tamoxifen and anastrozole in detail.  Basically anastrozole and the aromatase inhibitors in general work by blocking estrogen production. Accordingly vaginal dryness, decrease in bone density, and of course hot flashes can result. The aromatase inhibitors can also negatively affect the cholesterol profile, although that is a minor effect. One out of 5 women on aromatase inhibitors we will feel "old and achy". This arthralgia/myalgia syndrome, which resembles fibromyalgia clinically, does resolve with stopping the medications. Accordingly this is not a reason to not try an aromatase inhibitor but it is a frequent reason to stop it (in other words 20% of women will not be able to tolerate these medications).  Tamoxifen on the other hand does not block estrogen production. It does not "take away a woman's estrogen". It blocks the estrogen receptor in breast cells. Like anastrozole, it can also cause hot flashes. As opposed to anastrozole, tamoxifen  has many estrogen-like effects. It is technically an estrogen receptor modulator. This means that in some tissues tamoxifen works like estrogen-- for example it helps strengthen the bones. It tends to improve the cholesterol profile. It can cause thickening of the endometrial lining, and even endometrial polyps or rarely cancer of the uterus.(The risk of uterine cancer due to tamoxifen is one additional cancer per thousand women year). It can cause vaginal wetness or stickiness. It can cause blood clots through this estrogen-like effect--the risk of blood clots with tamoxifen is exactly the same as with birth control pills or hormone replacement.  Neither of these agents causes mood changes or weight gain, despite the popular belief that they can have these side effects. We have data from studies comparing either of these drugs with placebo, and in those cases the control group had the same amount of weight gain and depression as the group that took the drug.  Since she is still premenopausal we will be going with tamoxifen.  She finishes adjuvant radiation mid February.  I think it would be wise to have a little break before starting tamoxifen so her first day of treatment will be 05/08/2019.  I am going to see her in June and she will have her mammogram before that.  As far as birth control is concerned, I suggested she discuss a Mirena IUD or similar with Dr. Corinna Capra at their next visit.  Total encounter time 30 minutes.Chauncey Cruel, MD   03/21/2019 1:25 PM Medical Oncology and Hematology [facility] [name], [place] [address] Tel. [phone]    Fax. [phone]   This document serves as a record of services personally performed by Lurline Del, MD. It was created on his behalf by Wilburn Mylar, a trained medical scribe. The creation of this record is based on the scribe's personal observations and the provider's statements to them.   I, Lurline Del MD, have reviewed the above documentation for accuracy and completeness, and I agree with the above.   *Total Encounter Time as defined by the Centers for Medicare and Medicaid Services includes, in addition to the face-to-face time of a patient visit (documented in the note above) non-face-to-face time: obtaining and reviewing outside history, ordering and reviewing medications, tests or procedures, care coordination (  communications with other health care professionals or caregivers) and documentation in the medical record.

## 2019-03-21 ENCOUNTER — Inpatient Hospital Stay: Payer: 59 | Attending: Oncology | Admitting: Oncology

## 2019-03-21 ENCOUNTER — Ambulatory Visit
Admission: RE | Admit: 2019-03-21 | Discharge: 2019-03-21 | Disposition: A | Discharge status: Home / Self Care | Payer: 59 | Source: Ambulatory Visit | Attending: Radiation Oncology | Admitting: Radiation Oncology

## 2019-03-21 ENCOUNTER — Telehealth: Payer: Self-pay | Admitting: Oncology

## 2019-03-21 ENCOUNTER — Other Ambulatory Visit: Payer: Self-pay

## 2019-03-21 VITALS — BP 128/77 | HR 95 | Temp 97.8°F | Resp 18 | Ht 61.0 in | Wt 161.9 lb

## 2019-03-21 DIAGNOSIS — Z170 Estrogen receptor positive status [ER+]: Secondary | ICD-10-CM

## 2019-03-21 DIAGNOSIS — Z510 Encounter for antineoplastic radiation therapy: Secondary | ICD-10-CM | POA: Diagnosis not present

## 2019-03-21 DIAGNOSIS — C50211 Malignant neoplasm of upper-inner quadrant of right female breast: Secondary | ICD-10-CM

## 2019-03-21 MED ORDER — TAMOXIFEN CITRATE 20 MG PO TABS: ORAL_TABLET | ORAL | 12 refills | Status: DC

## 2019-03-21 NOTE — Telephone Encounter (Signed)
I left a message regarding video visit  °

## 2019-03-22 ENCOUNTER — Telehealth: Payer: Self-pay | Admitting: Oncology

## 2019-03-22 ENCOUNTER — Other Ambulatory Visit: Payer: Self-pay

## 2019-03-22 ENCOUNTER — Ambulatory Visit
Admission: RE | Admit: 2019-03-22 | Discharge: 2019-03-22 | Disposition: A | Discharge status: Home / Self Care | Payer: 59 | Source: Ambulatory Visit | Attending: Radiation Oncology | Admitting: Radiation Oncology

## 2019-03-22 DIAGNOSIS — Z170 Estrogen receptor positive status [ER+]: Secondary | ICD-10-CM | POA: Diagnosis not present

## 2019-03-22 DIAGNOSIS — C50211 Malignant neoplasm of upper-inner quadrant of right female breast: Secondary | ICD-10-CM | POA: Diagnosis not present

## 2019-03-22 DIAGNOSIS — Z510 Encounter for antineoplastic radiation therapy: Secondary | ICD-10-CM | POA: Diagnosis not present

## 2019-03-22 NOTE — Telephone Encounter (Signed)
I talk with patient regarding schedule  

## 2019-03-23 ENCOUNTER — Other Ambulatory Visit: Payer: Self-pay

## 2019-03-23 ENCOUNTER — Ambulatory Visit
Admission: RE | Admit: 2019-03-23 | Discharge: 2019-03-23 | Disposition: A | Discharge status: Home / Self Care | Payer: 59 | Source: Ambulatory Visit | Attending: Radiation Oncology | Admitting: Radiation Oncology

## 2019-03-23 DIAGNOSIS — Z170 Estrogen receptor positive status [ER+]: Secondary | ICD-10-CM | POA: Diagnosis not present

## 2019-03-23 DIAGNOSIS — Z510 Encounter for antineoplastic radiation therapy: Secondary | ICD-10-CM | POA: Diagnosis not present

## 2019-03-23 DIAGNOSIS — C50211 Malignant neoplasm of upper-inner quadrant of right female breast: Secondary | ICD-10-CM | POA: Diagnosis not present

## 2019-03-24 ENCOUNTER — Ambulatory Visit
Admission: RE | Admit: 2019-03-24 | Discharge: 2019-03-24 | Disposition: A | Discharge status: Home / Self Care | Payer: 59 | Source: Ambulatory Visit | Attending: Radiation Oncology | Admitting: Radiation Oncology

## 2019-03-24 ENCOUNTER — Other Ambulatory Visit: Payer: Self-pay

## 2019-03-24 DIAGNOSIS — Z510 Encounter for antineoplastic radiation therapy: Secondary | ICD-10-CM | POA: Diagnosis not present

## 2019-03-24 DIAGNOSIS — C50211 Malignant neoplasm of upper-inner quadrant of right female breast: Secondary | ICD-10-CM | POA: Diagnosis not present

## 2019-03-24 DIAGNOSIS — Z170 Estrogen receptor positive status [ER+]: Secondary | ICD-10-CM | POA: Diagnosis not present

## 2019-03-27 ENCOUNTER — Ambulatory Visit
Admission: RE | Admit: 2019-03-27 | Discharge: 2019-03-27 | Disposition: A | Discharge status: Home / Self Care | Payer: 59 | Source: Ambulatory Visit | Attending: Radiation Oncology | Admitting: Radiation Oncology

## 2019-03-27 ENCOUNTER — Other Ambulatory Visit: Payer: Self-pay

## 2019-03-27 DIAGNOSIS — Z510 Encounter for antineoplastic radiation therapy: Secondary | ICD-10-CM | POA: Diagnosis not present

## 2019-03-27 DIAGNOSIS — Z170 Estrogen receptor positive status [ER+]: Secondary | ICD-10-CM | POA: Diagnosis not present

## 2019-03-27 DIAGNOSIS — C50211 Malignant neoplasm of upper-inner quadrant of right female breast: Secondary | ICD-10-CM | POA: Diagnosis not present

## 2019-03-28 ENCOUNTER — Encounter: Payer: Self-pay | Admitting: Genetic Counselor

## 2019-03-28 ENCOUNTER — Ambulatory Visit
Admission: RE | Admit: 2019-03-28 | Discharge: 2019-03-28 | Disposition: A | Discharge status: Home / Self Care | Payer: 59 | Source: Ambulatory Visit | Attending: Radiation Oncology | Admitting: Radiation Oncology

## 2019-03-28 ENCOUNTER — Other Ambulatory Visit: Payer: Self-pay

## 2019-03-28 ENCOUNTER — Inpatient Hospital Stay (HOSPITAL_BASED_OUTPATIENT_CLINIC_OR_DEPARTMENT_OTHER): Payer: 59 | Admitting: Genetic Counselor

## 2019-03-28 ENCOUNTER — Other Ambulatory Visit: Payer: Self-pay | Admitting: Genetic Counselor

## 2019-03-28 DIAGNOSIS — C50211 Malignant neoplasm of upper-inner quadrant of right female breast: Secondary | ICD-10-CM

## 2019-03-28 DIAGNOSIS — Z808 Family history of malignant neoplasm of other organs or systems: Secondary | ICD-10-CM | POA: Diagnosis not present

## 2019-03-28 DIAGNOSIS — Z8041 Family history of malignant neoplasm of ovary: Secondary | ICD-10-CM

## 2019-03-28 DIAGNOSIS — Z803 Family history of malignant neoplasm of breast: Secondary | ICD-10-CM | POA: Insufficient documentation

## 2019-03-28 DIAGNOSIS — Z510 Encounter for antineoplastic radiation therapy: Secondary | ICD-10-CM | POA: Diagnosis not present

## 2019-03-28 DIAGNOSIS — Z801 Family history of malignant neoplasm of trachea, bronchus and lung: Secondary | ICD-10-CM

## 2019-03-28 DIAGNOSIS — Z170 Estrogen receptor positive status [ER+]: Secondary | ICD-10-CM

## 2019-03-28 NOTE — Progress Notes (Signed)
REFERRING PROVIDER: Chauncey Cruel, MD [address] [place],  [place] [address]  PRIMARY PROVIDER:  Hali Marry, MD  PRIMARY REASON FOR VISIT:  1. Malignant neoplasm of upper-inner quadrant of right breast in female, estrogen receptor positive ([place])   2. Family history of ovarian cancer   3. Family history of brain cancer   4. Family history of lung cancer   5. Family history of breast cancer      I connected with [name] on 03/28/2019 at 9:00 am EDT by Mychart video conference and verified that I am speaking with the correct person using two identifiers.   Patient location: home Provider location: [facility] office  HISTORY OF PRESENT ILLNESS:   [name], a 54 y.o. adult, was seen for a [place] cancer genetics consultation at the request of Dr. Jana Hakim due to a personal history of breast cancer, and a family history of ovarian cancer.  [name] presents to clinic today to discuss the possibility of a hereditary predisposition to cancer, genetic testing, and to further clarify her future cancer risks, as well as potential cancer risks for family members.   In 2020, at the age of 54, [name] was diagnosed with invasive ductal carcinoma, ER+/PR+/Her2-, of the right breast. The treatment plan has included surgery (right lumpectomy and sentinel lymph node biopsy on 01/19/19), oncotype (score of 18), radiation (currently in progress), and tamoxifen to start 05/08/2019.    CANCER HISTORY:  Oncology History   No history exists.     RISK FACTORS:  Menarche was at age 24.  First live birth at age 54.  OCP use for approximately 28 years.  Ovaries intact: yes.  Hysterectomy: no.  Menopausal status: premenopausal.  HRT use: 0 years. Colonoscopy: No.  Mammogram within the last year: yes. Number of breast biopsies: 1. Any excessive radiation exposure in the past: no  Past Medical History:  Diagnosis Date  . Eczema    legs  .  Family history of brain cancer   . Family history of breast cancer   . Family history of lung cancer   . Family history of ovarian cancer   . Hallux valgus 06/2012   left  . Hammertoe 06/2012   left second  . Heart murmur    since birth, has had no testing, no problems  . History of frequent ear infections   . Hypertension    under control with med., has been on med. x 3 yr.  . Irritable bowel syndrome (IBS)    no current med.  . Wears hearing aid    bilateral    Past Surgical History:  Procedure Laterality Date  . BREAST LUMPECTOMY WITH RADIOACTIVE SEED AND SENTINEL LYMPH NODE BIOPSY Right 01/19/2019   Procedure: RIGHT BREAST LUMPECTOMY WITH RADIOACTIVE SEED AND SENTINEL LYMPH NODE BIOPSY;  Surgeon: [name], MD;  Location: [facility];  Service: General;  Laterality: Right;  . EXPLORATORY TYMPANOTOMY Left 06/25/2000   with lysis of adhesions  . HAMMER TOE SURGERY Left 06/16/2012   Procedure: LEFT SCARF MODIFIED MCBRIDE AND SECOND Metatarsal WEIL;  Surgeon: Wylene Simmer, MD;  Location: [facility];  Service: Orthopedics;  Laterality: Left;  . TONSILLECTOMY    . TYMPANOPLASTY Left 06/25/2000  . TYMPANOSTOMY TUBE PLACEMENT  10/10/2009    Social History   Socioeconomic History  . Marital status: Married    Spouse name: Not on file  . Number of children: 2   . Years of  education: Not on file  . Highest education level: Not on file  Occupational History  . Occupation: works from home   Tobacco Use  . Smoking status: Never Smoker  . Smokeless tobacco: Never Used  Substance and Sexual Activity  . Alcohol use: Yes    Comment: occasionally  . Drug use: No  . Sexual activity: Not on file  Other Topics Concern  . Not on file  Social History Narrative   No regular exercise.     Social Determinants of Health   Financial Resource Strain:   . Difficulty of Paying Living Expenses: Not on file  Food Insecurity:   . Worried About Charity fundraiser in the Last Year:  Not on file  . Ran Out of Food in the Last Year: Not on file  Transportation Needs:   . Lack of Transportation (Medical): Not on file  . Lack of Transportation (Non-Medical): Not on file  Physical Activity:   . Days of Exercise per Week: Not on file  . Minutes of Exercise per Session: Not on file  Stress:   . Feeling of Stress : Not on file  Social Connections:   . Frequency of Communication with Friends and Family: Not on file  . Frequency of Social Gatherings with Friends and Family: Not on file  . Attends Religious Services: Not on file  . Active Member of Clubs or Organizations: Not on file  . Attends Archivist Meetings: Not on file  . Marital Status: Not on file     FAMILY HISTORY:  We obtained a detailed, 4-generation family history.  Significant diagnoses are listed below: Family History  Problem Relation Age of Onset  . Alzheimer's disease Mother   . Brain cancer Father 34       Glioblastoma multiforma.   . Ovarian cancer Paternal Aunt        dx. 73s or 50s  . Lung cancer Other        dx. 67s (maternal great-aunt)  . Breast cancer Other        dx. >50 (maternal fourth degree relative)   [name] has two children - a daughter who is 63 and a son who is 6. She has three brothers, ages 21, 59, and 93. None of these family members have had cancer.  [name] mother died at age 62 from Alzheimer's disease and did not have a history of cancer. She has one maternal aunt who is living in her 26s and one maternal uncle who died recently in his early 26s. Her grandparents died in their 95s and 73s and did not have cancer. [name] had a maternal great-aunt (grandmother's sister) who died from lung cancer in her 63s, and a maternal first cousin once removed (fourth degree relative) who had breast cancer diagnosed when she was older than 52. There are no other known diagnoses of cancer on the maternal side of the family.   [name] father died at age 67 due  to brain cancer (glioblastoma) that was diagnosed at age 68. She had one paternal aunt who died from ovarian cancer in her 83s or 49s. Her paternal grandparents died in their 85s and 26s and did not have cancer. There are no other known diagnoses of cancer on the paternal side of the family.  Ms. Raap is unaware of previous family history of genetic testing for hereditary cancer risks. Her ancestors are of [place] descent. There is no reported Ashkenazi Jewish ancestry. There is  no known consanguinity.  GENETIC COUNSELING ASSESSMENT: Ms. Will is a 54 y.o. adult with a personal history of breast cancer and a family history of ovarian cancer, which is somewhat suggestive of a hereditary cancer syndrome and predisposition to cancer. We, therefore, discussed and recommended the following at today's visit.   DISCUSSION: We discussed that 5 - 10% of breast cancer is hereditary, with most cases associated with the BRCA genes.  There are other genes that can be associated with hereditary breast cancer syndromes.  These include ATM, CHEK2, PALB2, etc.  A larger percentage of ovarian cancer is hereditary, with an underlying hereditary cause identified in 15-20% of cases.  We discussed that testing is beneficial for several reasons including knowing about other cancer risks, identifying potential screening and risk-reduction options that may be appropriate, and to understand if other family members could be at risk for cancer and allow them to undergo genetic testing.   We reviewed the characteristics, features and inheritance patterns of hereditary cancer syndromes. We also discussed genetic testing, including the appropriate family members to test, the process of testing, insurance coverage and turn-around-time for results. We discussed the implications of a negative, positive and/or variant of uncertain significant result. We recommended Ms. Malter pursue genetic testing for the [facility] Common Hereditary  Cancers panel.   The Common Hereditary Cancers Panel offered by [facility] includes sequencing and/or deletion duplication testing of the following 48 genes: APC, ATM, AXIN2, BARD1, BMPR1A, BRCA1, BRCA2, BRIP1, CDH1, CDK4, CDKN2A (p14ARF), CDKN2A (p16INK4a), CHEK2, CTNNA1, DICER1, EPCAM (Deletion/duplication testing only), GREM1 (promoter region deletion/duplication testing only), KIT, MEN1, MLH1, MSH2, MSH3, MSH6, MUTYH, NBN, NF1, NHTL1, PALB2, PDGFRA, PMS2, POLD1, POLE, PTEN, RAD50, RAD51C, RAD51D, RNF43, SDHB, SDHC, SDHD, SMAD4, SMARCA4. STK11, TP53, TSC1, TSC2, and VHL.  The following genes are evaluated for sequence changes only: SDHA and HOXB13 c.251G>A variant only.   Based on Ms. Kupper's personal and family history of cancer, she meets medical criteria for genetic testing. Despite that she meets criteria, she may still have an out of pocket cost. We discussed that if her out of pocket cost for testing is over $100, the laboratory will call and confirm whether she wants to proceed with testing.  If the out of pocket cost of testing is less than $100 she will be billed by the genetic testing laboratory.   PLAN: After considering the risks, benefits, and limitations, Ms. Braaten provided informed consent to pursue genetic testing and the blood sample will be sent to [facility] for analysis of the Common Hereditary Cancers panel. Results should be available within approximately two-three weeks' time, at which point they will be disclosed by telephone to Ms. Koors, as will any additional recommendations warranted by these results. Ms. Carbon will receive a summary of her genetic counseling visit and a copy of her results once available. This information will also be available in Epic.   Ms. Hinesley questions were answered to her satisfaction today. Our contact information was provided should additional questions or concerns arise. Thank you for the referral and allowing [place] to share in the  care of your patient.   Clint Guy, MS, [facility] Certified Genetic Counselor [email or web address] Phone: [phone]  The patient was seen for a total of 30 minutes in face-to-face genetic counseling.  This patient was discussed with Drs. Magrinat, Lindi Adie and/or Burr Medico who agrees with the above.    _______________________________________________________________________ For Office Staff:  Number of people involved in session: 1 Was an Intern/ student involved with case: no

## 2019-03-29 ENCOUNTER — Ambulatory Visit
Admission: RE | Admit: 2019-03-29 | Discharge: 2019-03-29 | Disposition: A | Discharge status: Home / Self Care | Payer: 59 | Source: Ambulatory Visit | Attending: Radiation Oncology | Admitting: Radiation Oncology

## 2019-03-29 ENCOUNTER — Other Ambulatory Visit: Payer: Self-pay

## 2019-03-29 ENCOUNTER — Inpatient Hospital Stay: Payer: 59

## 2019-03-29 DIAGNOSIS — C50211 Malignant neoplasm of upper-inner quadrant of right female breast: Secondary | ICD-10-CM | POA: Diagnosis not present

## 2019-03-29 DIAGNOSIS — Z510 Encounter for antineoplastic radiation therapy: Secondary | ICD-10-CM | POA: Diagnosis not present

## 2019-03-29 DIAGNOSIS — Z803 Family history of malignant neoplasm of breast: Secondary | ICD-10-CM | POA: Diagnosis not present

## 2019-03-29 DIAGNOSIS — Z8041 Family history of malignant neoplasm of ovary: Secondary | ICD-10-CM | POA: Diagnosis not present

## 2019-03-29 DIAGNOSIS — Z170 Estrogen receptor positive status [ER+]: Secondary | ICD-10-CM | POA: Diagnosis not present

## 2019-03-29 LAB — GENETIC SCREENING ORDER

## 2019-03-30 ENCOUNTER — Other Ambulatory Visit: Payer: Self-pay

## 2019-03-30 ENCOUNTER — Ambulatory Visit
Admission: RE | Admit: 2019-03-30 | Discharge: 2019-03-30 | Disposition: A | Discharge status: Home / Self Care | Payer: 59 | Source: Ambulatory Visit | Attending: Radiation Oncology | Admitting: Radiation Oncology

## 2019-03-30 DIAGNOSIS — C50211 Malignant neoplasm of upper-inner quadrant of right female breast: Secondary | ICD-10-CM | POA: Diagnosis not present

## 2019-03-30 DIAGNOSIS — Z170 Estrogen receptor positive status [ER+]: Secondary | ICD-10-CM | POA: Diagnosis not present

## 2019-03-30 DIAGNOSIS — Z510 Encounter for antineoplastic radiation therapy: Secondary | ICD-10-CM | POA: Diagnosis not present

## 2019-03-31 ENCOUNTER — Other Ambulatory Visit: Payer: Self-pay

## 2019-03-31 ENCOUNTER — Ambulatory Visit
Admission: RE | Admit: 2019-03-31 | Discharge: 2019-03-31 | Disposition: A | Discharge status: Home / Self Care | Payer: 59 | Source: Ambulatory Visit | Attending: Radiation Oncology | Admitting: Radiation Oncology

## 2019-03-31 DIAGNOSIS — C50211 Malignant neoplasm of upper-inner quadrant of right female breast: Secondary | ICD-10-CM | POA: Diagnosis not present

## 2019-03-31 DIAGNOSIS — Z510 Encounter for antineoplastic radiation therapy: Secondary | ICD-10-CM | POA: Diagnosis not present

## 2019-03-31 DIAGNOSIS — Z170 Estrogen receptor positive status [ER+]: Secondary | ICD-10-CM | POA: Diagnosis not present

## 2019-04-03 ENCOUNTER — Ambulatory Visit
Admission: RE | Admit: 2019-04-03 | Discharge: 2019-04-03 | Disposition: A | Discharge status: Home / Self Care | Payer: 59 | Source: Ambulatory Visit | Attending: Radiation Oncology | Admitting: Radiation Oncology

## 2019-04-03 ENCOUNTER — Other Ambulatory Visit: Payer: Self-pay

## 2019-04-03 DIAGNOSIS — Z170 Estrogen receptor positive status [ER+]: Secondary | ICD-10-CM | POA: Diagnosis not present

## 2019-04-03 DIAGNOSIS — Z510 Encounter for antineoplastic radiation therapy: Secondary | ICD-10-CM | POA: Diagnosis not present

## 2019-04-03 DIAGNOSIS — C50211 Malignant neoplasm of upper-inner quadrant of right female breast: Secondary | ICD-10-CM | POA: Diagnosis not present

## 2019-04-04 ENCOUNTER — Ambulatory Visit
Admission: RE | Admit: 2019-04-04 | Discharge: 2019-04-04 | Disposition: A | Discharge status: Home / Self Care | Payer: 59 | Source: Ambulatory Visit | Attending: Radiation Oncology | Admitting: Radiation Oncology

## 2019-04-04 ENCOUNTER — Other Ambulatory Visit: Payer: Self-pay

## 2019-04-04 DIAGNOSIS — Z170 Estrogen receptor positive status [ER+]: Secondary | ICD-10-CM | POA: Diagnosis not present

## 2019-04-04 DIAGNOSIS — C50211 Malignant neoplasm of upper-inner quadrant of right female breast: Secondary | ICD-10-CM | POA: Diagnosis not present

## 2019-04-04 DIAGNOSIS — Z510 Encounter for antineoplastic radiation therapy: Secondary | ICD-10-CM | POA: Diagnosis not present

## 2019-04-05 ENCOUNTER — Ambulatory Visit
Admission: RE | Admit: 2019-04-05 | Discharge: 2019-04-05 | Disposition: A | Discharge status: Home / Self Care | Payer: 59 | Source: Ambulatory Visit | Attending: Radiation Oncology | Admitting: Radiation Oncology

## 2019-04-05 ENCOUNTER — Other Ambulatory Visit: Payer: Self-pay

## 2019-04-05 DIAGNOSIS — Z170 Estrogen receptor positive status [ER+]: Secondary | ICD-10-CM | POA: Diagnosis not present

## 2019-04-05 DIAGNOSIS — C50211 Malignant neoplasm of upper-inner quadrant of right female breast: Secondary | ICD-10-CM | POA: Diagnosis not present

## 2019-04-05 DIAGNOSIS — Z510 Encounter for antineoplastic radiation therapy: Secondary | ICD-10-CM | POA: Diagnosis not present

## 2019-04-06 ENCOUNTER — Other Ambulatory Visit: Payer: Self-pay

## 2019-04-06 ENCOUNTER — Ambulatory Visit
Admission: RE | Admit: 2019-04-06 | Discharge: 2019-04-06 | Disposition: A | Discharge status: Home / Self Care | Payer: 59 | Source: Ambulatory Visit | Attending: Radiation Oncology | Admitting: Radiation Oncology

## 2019-04-06 DIAGNOSIS — C50211 Malignant neoplasm of upper-inner quadrant of right female breast: Secondary | ICD-10-CM | POA: Diagnosis not present

## 2019-04-06 DIAGNOSIS — Z170 Estrogen receptor positive status [ER+]: Secondary | ICD-10-CM | POA: Diagnosis not present

## 2019-04-06 DIAGNOSIS — Z510 Encounter for antineoplastic radiation therapy: Secondary | ICD-10-CM | POA: Diagnosis not present

## 2019-04-07 ENCOUNTER — Other Ambulatory Visit: Payer: Self-pay

## 2019-04-07 ENCOUNTER — Ambulatory Visit
Admission: RE | Admit: 2019-04-07 | Discharge: 2019-04-07 | Disposition: A | Discharge status: Home / Self Care | Payer: 59 | Source: Ambulatory Visit | Attending: Radiation Oncology | Admitting: Radiation Oncology

## 2019-04-07 ENCOUNTER — Ambulatory Visit: Payer: 59 | Admitting: Radiation Oncology

## 2019-04-07 DIAGNOSIS — Z170 Estrogen receptor positive status [ER+]: Secondary | ICD-10-CM | POA: Diagnosis not present

## 2019-04-07 DIAGNOSIS — C50211 Malignant neoplasm of upper-inner quadrant of right female breast: Secondary | ICD-10-CM | POA: Diagnosis not present

## 2019-04-07 DIAGNOSIS — Z510 Encounter for antineoplastic radiation therapy: Secondary | ICD-10-CM | POA: Diagnosis not present

## 2019-04-08 DIAGNOSIS — Z1379 Encounter for other screening for genetic and chromosomal anomalies: Secondary | ICD-10-CM | POA: Insufficient documentation

## 2019-04-10 ENCOUNTER — Telehealth: Payer: Self-pay | Admitting: Genetic Counselor

## 2019-04-10 ENCOUNTER — Other Ambulatory Visit: Payer: Self-pay

## 2019-04-10 ENCOUNTER — Ambulatory Visit: Payer: Self-pay | Admitting: Genetic Counselor

## 2019-04-10 ENCOUNTER — Ambulatory Visit
Admission: RE | Admit: 2019-04-10 | Discharge: 2019-04-10 | Disposition: A | Discharge status: Home / Self Care | Payer: 59 | Source: Ambulatory Visit | Attending: Radiation Oncology | Admitting: Radiation Oncology

## 2019-04-10 ENCOUNTER — Encounter: Payer: Self-pay | Admitting: Genetic Counselor

## 2019-04-10 DIAGNOSIS — C50211 Malignant neoplasm of upper-inner quadrant of right female breast: Secondary | ICD-10-CM | POA: Diagnosis not present

## 2019-04-10 DIAGNOSIS — Z170 Estrogen receptor positive status [ER+]: Secondary | ICD-10-CM | POA: Insufficient documentation

## 2019-04-10 DIAGNOSIS — Z510 Encounter for antineoplastic radiation therapy: Secondary | ICD-10-CM | POA: Diagnosis not present

## 2019-04-10 DIAGNOSIS — Z1379 Encounter for other screening for genetic and chromosomal anomalies: Secondary | ICD-10-CM

## 2019-04-10 NOTE — Telephone Encounter (Signed)
LVM that her genetic test results are available and requested that she call back to discuss them.  

## 2019-04-10 NOTE — Telephone Encounter (Signed)
Revealed negative genetic testing.  Discussed that we do not know why she has breast cancer or why there is cancer in the family.  It could be due to a different gene that we are not testing, or our current technology may not be able detect something.  It will be a good idea for her to keep in contact with genetics to keep up with whether additional testing may be appropriate in the future.

## 2019-04-10 NOTE — Progress Notes (Signed)
HPI:  [name] was previously seen in the [place] clinic due to a personal history of breast cancer as well as a family history of brain, ovarian, and breast cancer and concerns regarding a hereditary predisposition to cancer. Please refer to our prior cancer genetics clinic note for more information regarding our discussion, assessment and recommendations, at the time. [name] recent genetic test results were disclosed to her, as were recommendations warranted by these results. These results and recommendations are discussed in more detail below.  CANCER HISTORY:  Oncology History  Malignant neoplasm of upper-inner quadrant of right breast in female, estrogen receptor positive ([place])  12/13/2018 Initial Diagnosis   Malignant neoplasm of upper-inner quadrant of right breast in female, estrogen receptor positive ([place])   04/08/2019 Genetic Testing   Negative genetic testing:  No pathogenic variants detected on the [facility] Common Hereditary Cancers panel. The report date is 04/08/2019.  The Common Hereditary Cancers Panel offered by [facility] includes sequencing and/or deletion duplication testing of the following 48 genes: APC, ATM, AXIN2, BARD1, BMPR1A, BRCA1, BRCA2, BRIP1, CDH1, CDK4, CDKN2A (p14ARF), CDKN2A (p16INK4a), CHEK2, CTNNA1, DICER1, EPCAM (Deletion/duplication testing only), GREM1 (promoter region deletion/duplication testing only), KIT, MEN1, MLH1, MSH2, MSH3, MSH6, MUTYH, NBN, NF1, NHTL1, PALB2, PDGFRA, PMS2, POLD1, POLE, PTEN, RAD50, RAD51C, RAD51D, RNF43, SDHB, SDHC, SDHD, SMAD4, SMARCA4. STK11, TP53, TSC1, TSC2, and VHL.  The following genes were evaluated for sequence changes only: SDHA and HOXB13 c.251G>A variant only.      FAMILY HISTORY:  We obtained a detailed, 4-generation family history.  Significant diagnoses are listed below: Family History  Problem Relation Age of Onset  . Alzheimer's disease Mother   . Brain cancer Father 6       Glioblastoma  multiforma.   . Ovarian cancer Paternal Aunt        dx. 57s or 50s  . Lung cancer Other        dx. 58s (maternal great-aunt)  . Breast cancer Other        dx. >50 (maternal fourth degree relative)   [name] has two children - a daughter who is 27 and a son who is 68. She has three brothers, ages 62, 69, and 38. None of these family members have had cancer.  [name] mother died at age 54 from Alzheimer's disease and did not have a history of cancer. She has one maternal aunt who is living in her 90s and one maternal uncle who died recently in his early 42s. Her grandparents died in their 41s and 65s and did not have cancer. [name] had a maternal great-aunt (grandmother's sister) who died from lung cancer in her 17s, and a maternal first cousin once removed (fourth degree relative) who had breast cancer diagnosed when she was older than 28. There are no other known diagnoses of cancer on the maternal side of the family.   [name] father died at age 25 due to brain cancer (glioblastoma) that was diagnosed at age 77. She had one paternal aunt who died from ovarian cancer in her 52s or 17s. Her paternal grandparents died in their 64s and 54s and did not have cancer. There are no other known diagnoses of cancer on the paternal side of the family.  [name] is unaware of previous family history of genetic testing for hereditary cancer risks. Her ancestors are of [place] descent. There is no reported Ashkenazi Jewish ancestry. There is no known consanguinity.  GENETIC TEST RESULTS: Genetic testing  reported out on 04/08/2019 through the [facility] Common Hereditary Cancers panel. No pathogenic variants were detected.   The Common Hereditary Cancers Panel offered by [facility] includes sequencing and/or deletion duplication testing of the following 48 genes: APC, ATM, AXIN2, BARD1, BMPR1A, BRCA1, BRCA2, BRIP1, CDH1, CDK4, CDKN2A (p14ARF), CDKN2A (p16INK4a), CHEK2, CTNNA1, DICER1,  EPCAM (Deletion/duplication testing only), GREM1 (promoter region deletion/duplication testing only), KIT, MEN1, MLH1, MSH2, MSH3, MSH6, MUTYH, NBN, NF1, NHTL1, PALB2, PDGFRA, PMS2, POLD1, POLE, PTEN, RAD50, RAD51C, RAD51D, RNF43, SDHB, SDHC, SDHD, SMAD4, SMARCA4. STK11, TP53, TSC1, TSC2, and VHL.  The following genes were evaluated for sequence changes only: SDHA and HOXB13 c.251G>A variant only. The test report will be scanned into EPIC and located under the Molecular Pathology section of the Results Review tab.  A portion of the result report is included below for reference.     We discussed with [name] that because current genetic testing is not perfect, it is possible there may be a gene mutation in one of these genes that current testing cannot detect, but that chance is small.  We also discussed, that there could be another gene that has not yet been discovered, or that we have not yet tested, that is responsible for the cancer diagnoses in the family. It is also possible there is a hereditary cause for the cancer in the family that [name] did not inherit and therefore was not identified in her testing.  Therefore, it is important to remain in touch with cancer genetics in the future so that we can continue to offer [name] the most up to date genetic testing.   CANCER SCREENING RECOMMENDATIONS: [name] test result is considered negative (normal).  This means that we have not identified a hereditary cause for her personal and family history of cancer at this time. Most cancers happen by chance and this negative test suggests that her personal and family of cancer may fall into this category.    While reassuring, this does not definitively rule out a hereditary predisposition to cancer. It is still possible that there could be genetic mutations that are undetectable by current technology. There could be genetic mutations in genes that have not been tested or identified to increase  cancer risk.  Therefore, it is recommended she continue to follow the cancer management and screening guidelines provided by her oncology and primary healthcare provider.   An individual's cancer risk and medical management are not determined by genetic test results alone. Overall cancer risk assessment incorporates additional factors, including personal medical history, family history, and any available genetic information that may result in a personalized plan for cancer prevention and surveillance.  RECOMMENDATIONS FOR FAMILY MEMBERS:  Individuals in this family might be at some increased risk of developing cancer, over the general population risk, simply due to the family history of cancer.  We recommended women in this family have a yearly mammogram beginning at age 69, or 16 years younger than the earliest onset of cancer, an annual clinical breast exam, and perform monthly breast self-exams. Women in this family should also have a gynecological exam as recommended by their primary provider. All family members should have a colonoscopy by age 89.  FOLLOW-UP: Lastly, we discussed with [name] that cancer genetics is a rapidly advancing field and it is possible that new genetic tests will be appropriate for her and/or her family members in the future. We encouraged her to remain in contact with cancer genetics on an annual basis so  we can update her personal and family histories and let her know of advances in cancer genetics that may benefit this family.   Our contact number was provided. [name] questions were answered to her satisfaction, and she knows she is welcome to call us at anytime with additional questions or concerns.   Clint Guy, MS, [facility] Licensed Certified Genetic Counselor [email or web address] Phone: [phone]

## 2019-04-11 ENCOUNTER — Ambulatory Visit
Admission: RE | Admit: 2019-04-11 | Discharge: 2019-04-11 | Disposition: A | Discharge status: Home / Self Care | Payer: 59 | Source: Ambulatory Visit | Attending: Radiation Oncology | Admitting: Radiation Oncology

## 2019-04-11 ENCOUNTER — Other Ambulatory Visit: Payer: Self-pay

## 2019-04-11 ENCOUNTER — Ambulatory Visit: Payer: 59

## 2019-04-11 DIAGNOSIS — Z170 Estrogen receptor positive status [ER+]: Secondary | ICD-10-CM | POA: Diagnosis not present

## 2019-04-11 DIAGNOSIS — Z510 Encounter for antineoplastic radiation therapy: Secondary | ICD-10-CM | POA: Diagnosis not present

## 2019-04-11 DIAGNOSIS — C50211 Malignant neoplasm of upper-inner quadrant of right female breast: Secondary | ICD-10-CM | POA: Diagnosis not present

## 2019-04-12 ENCOUNTER — Ambulatory Visit: Payer: 59

## 2019-04-12 ENCOUNTER — Ambulatory Visit
Admission: RE | Admit: 2019-04-12 | Discharge: 2019-04-12 | Disposition: A | Discharge status: Home / Self Care | Payer: 59 | Source: Ambulatory Visit | Attending: Radiation Oncology | Admitting: Radiation Oncology

## 2019-04-12 ENCOUNTER — Other Ambulatory Visit: Payer: Self-pay

## 2019-04-12 DIAGNOSIS — Z170 Estrogen receptor positive status [ER+]: Secondary | ICD-10-CM | POA: Diagnosis not present

## 2019-04-12 DIAGNOSIS — Z510 Encounter for antineoplastic radiation therapy: Secondary | ICD-10-CM | POA: Diagnosis not present

## 2019-04-12 DIAGNOSIS — C50211 Malignant neoplasm of upper-inner quadrant of right female breast: Secondary | ICD-10-CM | POA: Diagnosis not present

## 2019-04-13 ENCOUNTER — Ambulatory Visit
Admission: RE | Admit: 2019-04-13 | Discharge: 2019-04-13 | Disposition: A | Discharge status: Home / Self Care | Payer: 59 | Source: Ambulatory Visit | Attending: Radiation Oncology | Admitting: Radiation Oncology

## 2019-04-13 DIAGNOSIS — C50211 Malignant neoplasm of upper-inner quadrant of right female breast: Secondary | ICD-10-CM | POA: Diagnosis not present

## 2019-04-13 DIAGNOSIS — Z170 Estrogen receptor positive status [ER+]: Secondary | ICD-10-CM | POA: Diagnosis not present

## 2019-04-13 DIAGNOSIS — Z510 Encounter for antineoplastic radiation therapy: Secondary | ICD-10-CM | POA: Diagnosis not present

## 2019-04-14 ENCOUNTER — Ambulatory Visit
Admission: RE | Admit: 2019-04-14 | Discharge: 2019-04-14 | Disposition: A | Discharge status: Home / Self Care | Payer: 59 | Source: Ambulatory Visit | Attending: Radiation Oncology | Admitting: Radiation Oncology

## 2019-04-14 ENCOUNTER — Other Ambulatory Visit: Payer: Self-pay

## 2019-04-14 DIAGNOSIS — C50211 Malignant neoplasm of upper-inner quadrant of right female breast: Secondary | ICD-10-CM | POA: Diagnosis not present

## 2019-04-14 DIAGNOSIS — Z170 Estrogen receptor positive status [ER+]: Secondary | ICD-10-CM | POA: Diagnosis not present

## 2019-04-14 DIAGNOSIS — Z510 Encounter for antineoplastic radiation therapy: Secondary | ICD-10-CM | POA: Diagnosis not present

## 2019-04-17 ENCOUNTER — Other Ambulatory Visit: Payer: Self-pay

## 2019-04-17 ENCOUNTER — Ambulatory Visit
Admission: RE | Admit: 2019-04-17 | Discharge: 2019-04-17 | Disposition: A | Discharge status: Home / Self Care | Payer: 59 | Source: Ambulatory Visit | Attending: Radiation Oncology | Admitting: Radiation Oncology

## 2019-04-17 DIAGNOSIS — Z170 Estrogen receptor positive status [ER+]: Secondary | ICD-10-CM | POA: Diagnosis not present

## 2019-04-17 DIAGNOSIS — C50211 Malignant neoplasm of upper-inner quadrant of right female breast: Secondary | ICD-10-CM | POA: Diagnosis not present

## 2019-04-17 DIAGNOSIS — Z510 Encounter for antineoplastic radiation therapy: Secondary | ICD-10-CM | POA: Diagnosis not present

## 2019-04-18 ENCOUNTER — Ambulatory Visit
Admission: RE | Admit: 2019-04-18 | Discharge: 2019-04-18 | Disposition: A | Discharge status: Home / Self Care | Payer: 59 | Source: Ambulatory Visit | Attending: Radiation Oncology | Admitting: Radiation Oncology

## 2019-04-18 ENCOUNTER — Other Ambulatory Visit: Payer: Self-pay

## 2019-04-18 DIAGNOSIS — C50211 Malignant neoplasm of upper-inner quadrant of right female breast: Secondary | ICD-10-CM | POA: Diagnosis not present

## 2019-04-18 DIAGNOSIS — Z510 Encounter for antineoplastic radiation therapy: Secondary | ICD-10-CM | POA: Diagnosis not present

## 2019-04-18 DIAGNOSIS — Z170 Estrogen receptor positive status [ER+]: Secondary | ICD-10-CM | POA: Diagnosis not present

## 2019-04-19 ENCOUNTER — Other Ambulatory Visit: Payer: Self-pay

## 2019-04-19 ENCOUNTER — Ambulatory Visit
Admission: RE | Admit: 2019-04-19 | Discharge: 2019-04-19 | Disposition: A | Discharge status: Home / Self Care | Payer: 59 | Source: Ambulatory Visit | Attending: Radiation Oncology | Admitting: Radiation Oncology

## 2019-04-19 DIAGNOSIS — Z170 Estrogen receptor positive status [ER+]: Secondary | ICD-10-CM | POA: Diagnosis not present

## 2019-04-19 DIAGNOSIS — C50211 Malignant neoplasm of upper-inner quadrant of right female breast: Secondary | ICD-10-CM | POA: Diagnosis not present

## 2019-04-19 DIAGNOSIS — Z510 Encounter for antineoplastic radiation therapy: Secondary | ICD-10-CM | POA: Diagnosis not present

## 2019-04-20 ENCOUNTER — Other Ambulatory Visit: Payer: Self-pay

## 2019-04-20 ENCOUNTER — Ambulatory Visit: Payer: 59

## 2019-04-20 DIAGNOSIS — C50211 Malignant neoplasm of upper-inner quadrant of right female breast: Secondary | ICD-10-CM | POA: Diagnosis not present

## 2019-04-20 DIAGNOSIS — Z170 Estrogen receptor positive status [ER+]: Secondary | ICD-10-CM | POA: Diagnosis not present

## 2019-04-20 DIAGNOSIS — Z510 Encounter for antineoplastic radiation therapy: Secondary | ICD-10-CM | POA: Diagnosis not present

## 2019-04-21 ENCOUNTER — Encounter: Payer: Self-pay | Admitting: Radiation Oncology

## 2019-04-21 ENCOUNTER — Ambulatory Visit: Payer: 59

## 2019-04-21 ENCOUNTER — Other Ambulatory Visit: Payer: Self-pay | Admitting: Family Medicine

## 2019-04-21 ENCOUNTER — Encounter: Payer: Self-pay | Admitting: *Deleted

## 2019-04-21 DIAGNOSIS — Z510 Encounter for antineoplastic radiation therapy: Secondary | ICD-10-CM | POA: Diagnosis not present

## 2019-04-21 DIAGNOSIS — Z170 Estrogen receptor positive status [ER+]: Secondary | ICD-10-CM | POA: Diagnosis not present

## 2019-04-21 DIAGNOSIS — C50211 Malignant neoplasm of upper-inner quadrant of right female breast: Secondary | ICD-10-CM | POA: Diagnosis not present

## 2019-04-21 DIAGNOSIS — I10 Essential (primary) hypertension: Secondary | ICD-10-CM

## 2019-04-21 MED FILL — LOSARTAN-HCTZ 50-12.5 MG TA: 50-12.5 | 90 days supply | Qty: 90 | Fill #0

## 2019-04-21 MED FILL — TAMOXIFEN 20 MG TABLET: 20 | 90 days supply | Qty: 90 | Fill #0

## 2019-04-21 MED FILL — FLUoxetine HCL 10 MG CAPS: 10 | 90 days supply | Qty: 180 | Fill #1

## 2019-04-26 ENCOUNTER — Other Ambulatory Visit: Payer: Self-pay

## 2019-04-26 ENCOUNTER — Ambulatory Visit (INDEPENDENT_AMBULATORY_CARE_PROVIDER_SITE_OTHER): Payer: 59 | Admitting: Family Medicine

## 2019-04-26 ENCOUNTER — Encounter: Payer: Self-pay | Admitting: Family Medicine

## 2019-04-26 VITALS — BP 130/66 | HR 88 | Ht 61.0 in | Wt 161.0 lb

## 2019-04-26 DIAGNOSIS — F439 Reaction to severe stress, unspecified: Secondary | ICD-10-CM | POA: Insufficient documentation

## 2019-04-26 DIAGNOSIS — E781 Pure hyperglyceridemia: Secondary | ICD-10-CM | POA: Diagnosis not present

## 2019-04-26 DIAGNOSIS — I10 Essential (primary) hypertension: Secondary | ICD-10-CM

## 2019-04-26 DIAGNOSIS — Z170 Estrogen receptor positive status [ER+]: Secondary | ICD-10-CM

## 2019-04-26 DIAGNOSIS — C50211 Malignant neoplasm of upper-inner quadrant of right female breast: Secondary | ICD-10-CM | POA: Diagnosis not present

## 2019-04-26 NOTE — Progress Notes (Signed)
Established Patient Office Visit  Subjective:  Patient ID: [name], female    DOB: [birth year]  Age: 54 y.o. MRN: [id number]  CC:  Chief Complaint  Patient presents with  . Hypertension    HPI [name] presents for   Hypertension- Pt denies chest pain, SOB, dizziness, or heart palpitations.  Taking meds as directed w/o problems.  Denies medication side effects.     She was diagnosed and has been treated for breast cancer since I last saw her.  She just completed radiation and will start tamoxifen at the beginning of March.  She says she is actually really doing well and has been very positive about things.  Stress-she decided to stay on her fluoxetine while dealing with her breast cancer and says she felt like it was helpful.  She wants to continue it for now but will try to maybe start weaning it again later this year.  Past Medical History:  Diagnosis Date  . Eczema    legs  . Family history of brain cancer   . Family history of breast cancer   . Family history of lung cancer   . Family history of ovarian cancer   . Hallux valgus 06/2012   left  . Hammertoe 06/2012   left second  . Heart murmur    since birth, has had no testing, no problems  . History of frequent ear infections   . Hypertension    under control with med., has been on med. x 3 yr.  . Irritable bowel syndrome (IBS)    no current med.  . Wears hearing aid    bilateral    Past Surgical History:  Procedure Laterality Date  . BREAST LUMPECTOMY WITH RADIOACTIVE SEED AND SENTINEL LYMPH NODE BIOPSY Right 01/19/2019   Procedure: RIGHT BREAST LUMPECTOMY WITH RADIOACTIVE SEED AND SENTINEL LYMPH NODE BIOPSY;  Surgeon: [name], MD;  Location: [facility];  Service: General;  Laterality: Right;  . EXPLORATORY TYMPANOTOMY Left 06/25/2000   with lysis of adhesions  . HAMMER TOE SURGERY Left 06/16/2012   Procedure: LEFT SCARF MODIFIED MCBRIDE AND SECOND Metatarsal WEIL;  Surgeon: Wylene Simmer, MD;   Location: [facility];  Service: Orthopedics;  Laterality: Left;  . TONSILLECTOMY    . TYMPANOPLASTY Left 06/25/2000  . TYMPANOSTOMY TUBE PLACEMENT  10/10/2009    Family History  Problem Relation Age of Onset  . Alzheimer's disease Mother   . Brain cancer Father 23       Glioblastoma multiforma.   . Ovarian cancer Paternal Aunt        dx. 71s or 50s  . Lung cancer Other        dx. 25s (maternal great-aunt)  . Breast cancer Other        dx. >50 (maternal fourth degree relative)    Social History   Socioeconomic History  . Marital status: Married    Spouse name: Not on file  . Number of children: 2   . Years of education: Not on file  . Highest education level: Not on file  Occupational History  . Occupation: works from home   Tobacco Use  . Smoking status: Never Smoker  . Smokeless tobacco: Never Used  Substance and Sexual Activity  . Alcohol use: Yes    Comment: occasionally  . Drug use: No  . Sexual activity: Not on file  Other Topics Concern  . Not on file  Social History Narrative   No regular exercise.  Social Determinants of Health   Financial Resource Strain:   . Difficulty of Paying Living Expenses: Not on file  Food Insecurity:   . Worried About Charity fundraiser in the Last Year: Not on file  . Ran Out of Food in the Last Year: Not on file  Transportation Needs:   . Lack of Transportation (Medical): Not on file  . Lack of Transportation (Non-Medical): Not on file  Physical Activity:   . Days of Exercise per Week: Not on file  . Minutes of Exercise per Session: Not on file  Stress:   . Feeling of Stress : Not on file  Social Connections:   . Frequency of Communication with Friends and Family: Not on file  . Frequency of Social Gatherings with Friends and Family: Not on file  . Attends Religious Services: Not on file  . Active Member of Clubs or Organizations: Not on file  . Attends Archivist Meetings: Not on file  .  Marital Status: Not on file  Intimate Partner Violence:   . Fear of Current or Ex-Partner: Not on file  . Emotionally Abused: Not on file  . Physically Abused: Not on file  . Sexually Abused: Not on file    Outpatient Medications Prior to Visit  Medication Sig Dispense Refill  . FLUoxetine (PROZAC) 10 MG capsule TAKE 2 CAPSULES BY MOUTH DAILY 180 capsule 1  . losartan-hydrochlorothiazide (HYZAAR) 50-12.5 MG tablet TAKE 1 TABLET BY MOUTH DAILY. 90 tablet 0  . tamoxifen (NOLVADEX) 20 MG tablet Take 1 tablet daily starting 05/08/2019 90 tablet 12   No facility-administered medications prior to visit.    Allergies  Allergen Reactions  . Erythromycin Nausea And Vomiting    FACIAL NUMBNESS  . Nitrofurantoin Other (See Comments)    GI UPSET  . Lexapro [Escitalopram Oxalate] Nausea Only  . Lisinopril Cough    ROS Review of Systems    Objective:    Physical Exam  Constitutional: She is oriented to person, place, and time. She appears well-developed and well-nourished.  HENT:  Head: Normocephalic and atraumatic.  Cardiovascular: Normal rate, regular rhythm and normal heart sounds.  Pulmonary/Chest: Effort normal and breath sounds normal.  Neurological: She is alert and oriented to person, place, and time.  Skin: Skin is warm and dry.  Psychiatric: She has a normal mood and affect. Her behavior is normal.    BP 130/66   Pulse 88   Ht 5\' 1"  (1.549 m)   Wt 161 lb (73 kg)   SpO2 100%   BMI 30.42 kg/m  Wt Readings from Last 3 Encounters:  04/26/19 161 lb (73 kg)  03/21/19 161 lb 14.4 oz (73.4 kg)  01/19/19 162 lb 9 oz (73.7 kg)     Health Maintenance Due  Topic Date Due  . PAP SMEAR-Modifier  06/26/2018    There are no preventive care reminders to display for this patient.  Lab Results  Component Value Date   TSH 2.238 07/27/2007   Lab Results  Component Value Date   WBC 9.6 12/21/2018   HGB 14.4 01/13/2019   HCT 45.6 12/21/2018   MCV 86.0 12/21/2018   PLT  337 12/21/2018   Lab Results  Component Value Date   NA 140 01/13/2019   K 3.8 01/13/2019   CO2 20 (L) 01/13/2019   GLUCOSE 120 (H) 01/13/2019   BUN 14 01/13/2019   CREATININE 1.03 (H) 01/13/2019   BILITOT 0.5 12/21/2018   ALKPHOS 100 12/21/2018  AST 28 12/21/2018   ALT 23 12/21/2018   PROT 7.5 12/21/2018   ALBUMIN 3.9 12/21/2018   CALCIUM 9.1 01/13/2019   ANIONGAP 11 01/13/2019   Lab Results  Component Value Date   CHOL 167 04/14/2018   Lab Results  Component Value Date   HDL 42 (L) 04/14/2018   Lab Results  Component Value Date   LDLCALC 100 (H) 04/14/2018   Lab Results  Component Value Date   TRIG 148 04/14/2018   Lab Results  Component Value Date   CHOLHDL 4.0 04/14/2018   Lab Results  Component Value Date   HGBA1C 5.5 04/01/2015      Assessment & Plan:   Problem List Items Addressed This Visit      Cardiovascular and Mediastinum   Essential hypertension - Primary    Well controlled. Continue current regimen. Follow up in  6 mo due for labs.      Relevant Orders   COMPLETE METABOLIC PANEL WITH GFR   Lipid Panel w/reflex Direct LDL     Other   Stress    We will continue with low-dose fluoxetine for now.  If at any point she decides she would like to taper or wean I think that would be completely reasonable.  They go ahead and refill medications for the next 6 months.      Malignant neoplasm of upper-inner quadrant of right breast in female, estrogen receptor positive ([place])    She is starting tamoxifen at the beginning if march. She just completed her Radiation.        Hypertriglyceridemia    Due for updated lipid panel.  Not currently on medication for her triglycerides.      Relevant Orders   COMPLETE METABOLIC PANEL WITH GFR   Lipid Panel w/reflex Direct LDL     She reports that her Pap smear is up-to-date with Dr. Corinna Capra.  Will call and get report from 2020.  No orders of the defined types were placed in this  encounter.   Follow-up: Return in about 6 months (around 10/24/2019) for Hypertension.    Beatrice Lecher, MD

## 2019-04-26 NOTE — Assessment & Plan Note (Addendum)
Well controlled. Continue current regimen. Follow up in  6 mo due for labs.  

## 2019-04-26 NOTE — Assessment & Plan Note (Signed)
We will continue with low-dose fluoxetine for now.  If at any point she decides she would like to taper or wean I think that would be completely reasonable.  They go ahead and refill medications for the next 6 months.

## 2019-04-26 NOTE — Patient Instructions (Signed)
Check BP at home a couple of times.  Goal to be in the 120s.

## 2019-04-26 NOTE — Assessment & Plan Note (Signed)
Due for updated lipid panel.  Not currently on medication for her triglycerides.

## 2019-04-26 NOTE — Assessment & Plan Note (Signed)
She is starting tamoxifen at the beginning if march. She just completed her Radiation.

## 2019-04-27 LAB — COMPLETE METABOLIC PANEL WITH GFR
AG Ratio: 1.8 (calc) (ref 1.0–2.5)
ALT: 61 U/L — ABNORMAL HIGH (ref 6–29)
AST: 50 U/L — ABNORMAL HIGH (ref 10–35)
Albumin: 4.1 g/dL (ref 3.6–5.1)
Alkaline phosphatase (APISO): 118 U/L (ref 37–153)
BUN: 17 mg/dL (ref 7–25)
CO2: 23 mmol/L (ref 20–32)
Calcium: 8.9 mg/dL (ref 8.6–10.4)
Chloride: 108 mmol/L (ref 98–110)
Creat: 0.92 mg/dL (ref 0.50–1.05)
GFR, Est African American: 82 mL/min/{1.73_m2} (ref >=60)
GFR, Est Non African American: 71 mL/min/{1.73_m2} (ref >=60)
Globulin: 2.3 g/dL (calc) (ref 1.9–3.7)
Glucose, Bld: 102 mg/dL (ref 65–139)
Potassium: 3.7 mmol/L (ref 3.5–5.3)
Sodium: 140 mmol/L (ref 135–146)
Total Bilirubin: 0.5 mg/dL (ref 0.2–1.2)
Total Protein: 6.4 g/dL (ref 6.1–8.1)

## 2019-04-27 LAB — LIPID PANEL W/REFLEX DIRECT LDL
Cholesterol: 148 mg/dL (ref <200)
HDL: 47 mg/dL — ABNORMAL LOW (ref >=50)
LDL Cholesterol (Calc): 87 mg/dL (calc)
Non-HDL Cholesterol (Calc): 101 mg/dL (calc) (ref <130)
Total CHOL/HDL Ratio: 3.1 (calc) (ref <5.0)
Triglycerides: 57 mg/dL (ref <150)

## 2019-05-06 NOTE — Progress Notes (Signed)
  Radiation Oncology         [phone] ________________________________  Name: [name] MRN: [id number]  Date: 04/21/2019  DOB: Jan 04, 1966  End of Treatment Note  Diagnosis:   right-sided breast cancer     Indication for treatment:  Curative       Radiation treatment dates:   03/07/19 - 04/21/19  Site/dose:   The patient initially received a dose of 50 Gy in 25 fractions to the breast using whole-breast tangent fields. This was delivered using a 3-D conformal technique. The patient then received a boost to the seroma. This delivered an additional 14.4 Gy in 8 fractions using a 3-field photon boost. The total dose was 64.4 Gy.  Narrative: The patient tolerated radiation treatment relatively well.   The patient had some expected skin irritation as she progressed during treatment. Moist desquamation was not present at the end of treatment.  Plan: The patient has completed radiation treatment. The patient will return to radiation oncology clinic for routine followup in one month. I advised the patient to call or return sooner if they have any questions or concerns related to their recovery or treatment. ________________________________  Jodelle Gross, M.D., Ph.D.

## 2019-05-06 NOTE — Progress Notes (Deleted)
  Radiation Oncology         [phone] ________________________________  Name: [name] MRN: [id number]  Date: 04/21/2019  DOB: 1965/05/08  DIAGNOSIS:   No diagnosis found.   SIMULATION AND TREATMENT PLANNING NOTE  The patient presented for simulation prior to beginning her course of radiation treatment for her diagnosis of right-sided breast cancer. The patient was placed in a supine position on a breast board. A customized vac-lock bag was constructed and this complex treatment device will be used on a daily basis during her treatment. In this fashion, a CT scan was obtained through the chest area and an isocenter was placed near the chest wall within the breast.  The patient will be planned to receive a course of radiation initially to a dose of 50 Gy. This will consist of a whole breast radiotherapy technique. To accomplish this, 2 customized blocks have been designed which will correspond to medial and lateral whole breast tangent fields. This treatment will be accomplished at 2 Gy per fraction. A forward planning technique will also be evaluated to determine if this approach improves the plan. It is anticipated that the patient will then receive a 14.4 Gy boost to the seroma cavity which has been contoured. This will be accomplished at 1.8 Gy per fraction.   This initial treatment will consist of a 3-D conformal technique. The seroma has been contoured as the primary target structure. Additionally, dose volume histograms of both this target as well as the lungs and heart will also be evaluated. Such an approach is necessary to ensure that the target area is adequately covered while the nearby critical  normal structures are adequately spared.  Plan:  The final anticipated total dose therefore will correspond to 64.4 Gy.    _______________________________   Jodelle Gross, MD, PhD

## 2019-05-15 ENCOUNTER — Telehealth: Payer: Self-pay | Admitting: Radiation Oncology

## 2019-05-15 NOTE — Telephone Encounter (Signed)
  Radiation Oncology         [phone] ________________________________  Name: [name] MRN: [id number]  Date of Service: 05/15/2019  DOB: 1965-07-24  Post Treatment Telephone Note  Diagnosis:  Stage IA, pT1cN0M0 grade 2, ER/PR positive invasive ductal carcinoma of the right breast.  Interval Since Last Radiation:  4 weeks   03/07/19 - 04/21/19: The patient initially received a dose of 50 Gy in 25 fractions to the right breast using whole-breast tangent fields. This was delivered using a 3-D conformal technique. The patient then received a boost to the seroma. This delivered an additional 14.4 Gy in 8 fractions using a 3-field photon boost. The total dose was 64.4 Gy.  Narrative:  The patient was contacted today for routine follow-up. During treatment she did very well with radiotherapy and did not have significant desquamation. She reports she is doing quite well with her skin and since starting tamoxifen. She denies any concerns or questions about raditotherapy.  Impression/Plan: 1. Stage IA, pT1cN0M0 grade 2, ER/PR positive invasive ductal carcinoma of the right breast.The patient has been doing well since completion of radiotherapy. We discussed that we would be happy to continue to follow her as needed, but she will also continue to follow up with Dr. Jana Hakim in medical oncology. She was counseled on skin care as well as measures to avoid sun exposure to this area.  2. Survivorship. We discussed the importance of survivorship evaluation and encouraged her to attend her upcoming visit with that clinic.    Carola Rhine, PAC

## 2019-05-17 DIAGNOSIS — G4733 Obstructive sleep apnea (adult) (pediatric): Secondary | ICD-10-CM | POA: Diagnosis not present

## 2019-05-18 ENCOUNTER — Encounter: Payer: Self-pay | Admitting: Family Medicine

## 2019-06-02 ENCOUNTER — Ambulatory Visit: Payer: 59 | Attending: Internal Medicine

## 2019-06-02 DIAGNOSIS — Z23 Encounter for immunization: Secondary | ICD-10-CM

## 2019-06-02 NOTE — Progress Notes (Signed)
   Covid-19 Vaccination Clinic  Name:  [name]    MRN: [id number] DOB: 01/10/1966  06/02/2019  [name] was observed post Covid-19 immunization for 15 minutes without incident. She was provided with Vaccine Information Sheet and instruction to access the V-Safe system.   [name] was instructed to call 911 with any severe reactions post vaccine: Marland Kitchen Difficulty breathing  . Swelling of face and throat  . A fast heartbeat  . A bad rash all over body  . Dizziness and weakness   Immunizations Administered    Name Date Dose VIS Date Route   Pfizer COVID-19 Vaccine 06/02/2019  2:29 PM 0.3 mL 02/17/2019 Intramuscular   Manufacturer: [place]   Lot: [id number]   [place]: [id number]

## 2019-06-26 DIAGNOSIS — Z170 Estrogen receptor positive status [ER+]: Secondary | ICD-10-CM | POA: Diagnosis not present

## 2019-06-26 DIAGNOSIS — C50211 Malignant neoplasm of upper-inner quadrant of right female breast: Secondary | ICD-10-CM | POA: Diagnosis not present

## 2019-06-26 DIAGNOSIS — I890 Lymphedema, not elsewhere classified: Secondary | ICD-10-CM | POA: Diagnosis not present

## 2019-06-28 ENCOUNTER — Ambulatory Visit: Payer: 59 | Attending: Internal Medicine

## 2019-06-28 DIAGNOSIS — Z23 Encounter for immunization: Secondary | ICD-10-CM

## 2019-06-28 NOTE — Progress Notes (Signed)
   Covid-19 Vaccination Clinic  Name:  [name]    MRN: [id number] DOB: 01/08/1966  06/28/2019  [name] was observed post Covid-19 immunization for 15 minutes without incident. She was provided with Vaccine Information Sheet and instruction to access the V-Safe system.   [name] was instructed to call 911 with any severe reactions post vaccine: Marland Kitchen Difficulty breathing  . Swelling of face and throat  . A fast heartbeat  . A bad rash all over body  . Dizziness and weakness   Immunizations Administered    Name Date Dose VIS Date Route   Pfizer COVID-19 Vaccine 06/28/2019  2:00 PM 0.3 mL 05/03/2018 Intramuscular   Manufacturer: [place]   Lot: [id number]   [place]: [id number]

## 2019-07-04 ENCOUNTER — Ambulatory Visit: Payer: 59 | Attending: General Surgery | Admitting: Physical Therapy

## 2019-07-04 ENCOUNTER — Encounter: Payer: Self-pay | Admitting: Physical Therapy

## 2019-07-04 ENCOUNTER — Other Ambulatory Visit: Payer: Self-pay

## 2019-07-04 DIAGNOSIS — M6281 Muscle weakness (generalized): Secondary | ICD-10-CM | POA: Diagnosis not present

## 2019-07-04 DIAGNOSIS — R293 Abnormal posture: Secondary | ICD-10-CM | POA: Insufficient documentation

## 2019-07-04 DIAGNOSIS — M25611 Stiffness of right shoulder, not elsewhere classified: Secondary | ICD-10-CM | POA: Diagnosis not present

## 2019-07-04 DIAGNOSIS — L599 Disorder of the skin and subcutaneous tissue related to radiation, unspecified: Secondary | ICD-10-CM | POA: Insufficient documentation

## 2019-07-04 NOTE — Therapy (Signed)
[place], [place], [address] Phone: [phone]   Fax:  [phone]  Physical Therapy Evaluation  Patient Details  Name: [name] MRN: [id number] Date of Birth: Oct 17, 1965 Referring Provider (PT): Dr. [name]   Encounter Date: 07/04/2019  PT End of Session - 07/04/19 1400    Visit Number  1    Number of Visits  9    Date for PT Re-Evaluation  08/07/19    PT Start Time  1300    PT Stop Time  1345    PT Time Calculation (min)  45 min    Activity Tolerance  Patient tolerated treatment well    Behavior During Therapy  [facility] for tasks assessed/performed       Past Medical History:  Diagnosis Date  . Eczema    legs  . Family history of brain cancer   . Family history of breast cancer   . Family history of lung cancer   . Family history of ovarian cancer   . Hallux valgus 06/2012   left  . Hammertoe 06/2012   left second  . Heart murmur    since birth, has had no testing, no problems  . History of frequent ear infections   . Hypertension    under control with med., has been on med. x 3 yr.  . Irritable bowel syndrome (IBS)    no current med.  . Wears hearing aid    bilateral    Past Surgical History:  Procedure Laterality Date  . BREAST LUMPECTOMY WITH RADIOACTIVE SEED AND SENTINEL LYMPH NODE BIOPSY Right 01/19/2019   Procedure: RIGHT BREAST LUMPECTOMY WITH RADIOACTIVE SEED AND SENTINEL LYMPH NODE BIOPSY;  Surgeon: [name], MD;  Location: [place];  Service: General;  Laterality: Right;  . EXPLORATORY TYMPANOTOMY Left 06/25/2000   with lysis of adhesions  . HAMMER TOE SURGERY Left 06/16/2012   Procedure: LEFT SCARF MODIFIED MCBRIDE AND SECOND Metatarsal WEIL;  Surgeon: Wylene Simmer, MD;  Location: [place];  Service: Orthopedics;  Laterality: Left;  . TONSILLECTOMY    . TYMPANOPLASTY Left 06/25/2000  . TYMPANOSTOMY TUBE PLACEMENT  10/10/2009    There were no  vitals filed for this visit.   Subjective Assessment - 07/04/19 1312    Subjective  Pt states  she has noticed some fullness in her right breast. She wears a regular sports bra that doesn't give her alot compression    Pertinent History  Patient was diagnosed on 12/01/2018 with right grade II invasive ductal carcinoma breast cancer. She underwent a right lumpectomy and sentinel node biopsy (0/4 nodes positive) on 01/19/2019. Her Oncotype was low so did not need chemo.  Had radiation that was completed 04/21/2019.  She has some peeling and dry sking.  He is taking anti-estrogen therapy.It is ER/PR positive and HER2 negative with a Ki67 of 10%.    Patient Stated Goals  to get help with swelling in her breast    Currently in Pain?  No/denies         [facility] PT Assessment - 07/04/19 0001      Assessment   Medical Diagnosis  s/p right lumpectomy and SLNB    Referring Provider (PT)  Dr. [name]    Onset Date/Surgical Date  01/19/19    Hand Dominance  Right    Prior Therapy  Baselines and post op       Precautions   Precautions  Other (comment)  Precaution Comments   surgery, 4 nodes removed, radiation       Restrictions   Weight Bearing Restrictions  No      Balance Screen   Has the patient fallen in the past 6 months  No    Has the patient had a decrease in activity level because of a fear of falling?   No    Is the patient reluctant to leave their home because of a fear of falling?   No      Home Environment   Living Environment  Private residence    Living Arrangements  Spouse/significant other;Children   Husband and 69 y.o. son   Available Help at Discharge  Family      Prior Function   Level of Independence  Independent    Vocation  Full time employment   works from [employer] for [employer]  walks about 30 minutes most days       Cognition   Overall Cognitive Status  Within Functional Limits for tasks assessed       Observation/Other Assessments   Observations  visible fullness in left breast especailly in superior portion.  Pt has dry scaly skin on nipple even though she says she sometimes puts lotion on it Skin is darkened in left axilla  well healed incision in left axilla congestion in scars around nipple of left breast     Skin Integrity  no open areas       Sensation   Light Touch  Not tested      Coordination   Gross Motor Movements are Fluid and Coordinated  Yes      Posture/Postural Control   Posture/Postural Control  Postural limitations    Postural Limitations  Rounded Shoulders;Forward head      ROM / Strength   AROM / PROM / Strength  AROM;Strength      AROM   Overall AROM   Deficits    Overall AROM Comments  pt feels "pulling" in left axilla     Right Shoulder Extension  50 Degrees    Right Shoulder Flexion  150 Degrees    Right Shoulder ABduction  160 Degrees    Right Shoulder Internal Rotation  60 Degrees    Right Shoulder External Rotation  90 Degrees    Left Shoulder Flexion  160 Degrees    Left Shoulder ABduction  165 Degrees      Strength   Overall Strength  Deficits    Overall Strength Comments  unable to get full strength and active ROM of right shoulder due to "pulling"       Palpation   Palpation comment  frimness in left breast especaily in superior portion  and around nipple         LYMPHEDEMA/ONCOLOGY QUESTIONNAIRE - 07/04/19 1320      Right Upper Extremity Lymphedema   10 cm Proximal to Olecranon Process  29.5 cm    Olecranon Process  25 cm    10 cm Proximal to Ulnar Styloid Process  23 cm    Just Proximal to Ulnar Styloid Process  14.8 cm    Across Hand at [employer]  18.3 cm    At [place] of 2nd Digit  6.1 cm             Objective measurements completed on examination: See above findings.      [place] Adult PT Treatment/Exercise - 07/04/19 0001  Manual Therapy   Manual Therapy  Edema management;Manual Lymphatic Drainage (MLD)     Manual therapy comments  sent script request to [facility] for compression bra and demographics sent to [facility] for compression sleeve     Edema Management  Pt given link for klosetraining website for self MLD to left breast.  Encouraged pt to wear pink compression binder at home to see if she can get some reduction in breast fullness until she gets compression bra.  Pt will call [facility] for an appointment for bra     Manual Lymphatic Drainage (MLD)  briefly instructed in basics of lymph system anatomy.  instructed in: short neck superficial and deep abdominals, left axillary nodes and anterior interaxillary anastamosis, right inguinal nodes and right axillo-inguinal anastamosis, top of right breast to left axillary nodes and to sidleying for lateral aspect of right breast and lateral abdomen                   PT Long Term Goals - 07/04/19 1410      PT LONG TERM GOAL #1   Title  Pt ill be able to manage lymphedema in her right breast with self MLD, compression and exercise    Time  4    Period  Weeks    Status  New      PT LONG TERM GOAL #2   Title  Pt will report the feelings of fullness in her right breast are decreased by 50%    Time  4    Period  Weeks    Status  New      PT LONG TERM GOAL #3   Title  Pt will have a HEP for right shoulder stretching and strengthening  and a plan to continue with progressive strengthening at discharge    Time  4    Period  Weeks    Status  New             Plan - 07/04/19 1401    Clinical Impression Statement  Pt returns to PT about 2 months after completion of radiation with increased fullness in right breast and tightness in left shoudler with pulling in axilla with full elevation.  she has dryness and scaliness at nipple with some congestion at scars in this area. She has no measurable fullness in right arm.   She will benefit from a compression bra and a compression sleeve for prophylaxis . She needs a review of  stretching to right shoulder.  As a [employer] employee she has the option of 4 free personal training session upon completion of PT that she would like to find more about.    Personal Factors and Comorbidities  Comorbidity 2    Comorbidities  surgery with 4 nodes removed, radiation    Examination-Activity Limitations  Reach Overhead    Stability/Clinical Decision Making  Stable/Uncomplicated    Clinical Decision Making  Low    Rehab Potential  Good    PT Frequency  3x / week   2-3 times a week with decreasing frequency   PT Duration  4 weeks    PT Treatment/Interventions  ADLs/Self Care Home Management;Therapeutic activities;Therapeutic exercise;Orthotic Fit/Training;Patient/family education;Manual techniques;Manual lymph drainage;Passive range of motion;Scar mobilization;Compression bandaging;Taping    PT Next Visit Plan  Continue teaching self MLd for right breast. Check to see if she got an appt with [facility]. sign her up with Melissa to get prophylactic sleefe and maybe compressin bra is she cannot  get appointment with [facility] nature in a reasonable time.  Teach stretching to right shoulder and progress with shoulder strengthening. Transition to [employer] personal training session ( see Helene Kelp for details)    Consulted and Agree with Plan of Care  Patient       Patient will benefit from skilled therapeutic intervention in order to improve the following deficits and impairments:  Postural dysfunction, Decreased range of motion, Decreased knowledge of precautions, Impaired UE functional use, Pain, Decreased strength, Decreased scar mobility, Decreased skin integrity, Increased edema, Impaired perceived functional ability, Impaired flexibility, Increased fascial restricitons, Obesity  Visit Diagnosis: Disorder of the skin and subcutaneous tissue related to radiation, unspecified - Plan: PT plan of care cert/re-cert  Stiffness of right shoulder, not elsewhere classified - Plan: PT plan  of care cert/re-cert  Muscle weakness (generalized) - Plan: PT plan of care cert/re-cert  Abnormal posture - Plan: PT plan of care cert/re-cert     Problem List Patient Active Problem List   Diagnosis Date Noted  . Stress 04/26/2019  . Genetic testing 04/08/2019  . Family history of ovarian cancer   . Family history of brain cancer   . Family history of lung cancer   . Family history of breast cancer   . Malignant neoplasm of upper-inner quadrant of right breast in female, estrogen receptor positive ([place]) 12/13/2018  . Irritable bowel syndrome 11/30/2018  . Hypertriglyceridemia 09/30/2015  . OSA (obstructive sleep apnea) 10/25/2013  . Hearing loss 05/28/2011  . Essential hypertension 09/03/2008  . GOITER 07/14/2007   Donato Heinz. Owens Shark PT  Norwood Levo 07/04/2019, 2:26 PM  [place], [place], [address] Phone: [phone]   Fax:  [phone]  Name: [name] MRN: [id number] Date of Birth: [birth year]

## 2019-07-04 NOTE — Patient Instructions (Addendum)
First of all, check with your insurance company to see if provider is in [place] (for wigs and compression sleeves / gloves/gauntlets )  [place], [place] [address] [phone]  Will file some insurances --- call for appointment   [facility] to [facility] (for mastectomy prosthetics and garments) [place], [place] [address] [phone] Will file some insurances --- call for appointment  [facility]  [address]  [place], [place] [address] [phone] Lower extremity garments  [facility] [place], [place]  [address] [place] ( Medicaid certified lymphedema fitter) [phone] [email or web address]  [address] [place], [place] [address] [phone]  Other Resources: National Lymphedema Network:  www.lymphnet.org www.Klosetraining.com for patient articles and self manual lymph drainage information www.lymphedemablog.com has informative articles.  DishTag.es.com www.lymphedemaproducts.com www.brightlifedirect.com ThisOrder.com.ee.com   Www.klosetraining.com Resources Self care videos Self Manual lymph drainage

## 2019-07-07 ENCOUNTER — Ambulatory Visit: Payer: 59

## 2019-07-10 ENCOUNTER — Ambulatory Visit: Payer: 59 | Attending: General Surgery

## 2019-07-10 ENCOUNTER — Other Ambulatory Visit: Payer: Self-pay

## 2019-07-10 DIAGNOSIS — Z483 Aftercare following surgery for neoplasm: Secondary | ICD-10-CM | POA: Insufficient documentation

## 2019-07-10 DIAGNOSIS — M6281 Muscle weakness (generalized): Secondary | ICD-10-CM | POA: Insufficient documentation

## 2019-07-10 DIAGNOSIS — R293 Abnormal posture: Secondary | ICD-10-CM | POA: Diagnosis not present

## 2019-07-10 DIAGNOSIS — Z170 Estrogen receptor positive status [ER+]: Secondary | ICD-10-CM | POA: Diagnosis not present

## 2019-07-10 DIAGNOSIS — M25611 Stiffness of right shoulder, not elsewhere classified: Secondary | ICD-10-CM | POA: Diagnosis not present

## 2019-07-10 DIAGNOSIS — C50211 Malignant neoplasm of upper-inner quadrant of right female breast: Secondary | ICD-10-CM | POA: Diagnosis not present

## 2019-07-10 DIAGNOSIS — L599 Disorder of the skin and subcutaneous tissue related to radiation, unspecified: Secondary | ICD-10-CM | POA: Insufficient documentation

## 2019-07-10 NOTE — Therapy (Signed)
[place], [place], [address] Phone: [phone]   Fax:  [phone]  Physical Therapy Treatment  Patient Details  Name: [name] MRN: [id number] Date of Birth: 09-Dec-1965 Referring Provider (PT): Dr. [name]   Encounter Date: 07/10/2019  PT End of Session - 07/10/19 0903    Visit Number  2    Number of Visits  9    Date for PT Re-Evaluation  08/07/19    PT Start Time  0903    PT Stop Time  0956    PT Time Calculation (min)  53 min    Activity Tolerance  Patient tolerated treatment well    Behavior During Therapy  [facility] for tasks assessed/performed       Past Medical History:  Diagnosis Date  . Eczema    legs  . Family history of brain cancer   . Family history of breast cancer   . Family history of lung cancer   . Family history of ovarian cancer   . Hallux valgus 06/2012   left  . Hammertoe 06/2012   left second  . Heart murmur    since birth, has had no testing, no problems  . History of frequent ear infections   . Hypertension    under control with med., has been on med. x 3 yr.  . Irritable bowel syndrome (IBS)    no current med.  . Wears hearing aid    bilateral    Past Surgical History:  Procedure Laterality Date  . BREAST LUMPECTOMY WITH RADIOACTIVE SEED AND SENTINEL LYMPH NODE BIOPSY Right 01/19/2019   Procedure: RIGHT BREAST LUMPECTOMY WITH RADIOACTIVE SEED AND SENTINEL LYMPH NODE BIOPSY;  Surgeon: [name], MD;  Location: [place];  Service: General;  Laterality: Right;  . EXPLORATORY TYMPANOTOMY Left 06/25/2000   with lysis of adhesions  . HAMMER TOE SURGERY Left 06/16/2012   Procedure: LEFT SCARF MODIFIED MCBRIDE AND SECOND Metatarsal WEIL;  Surgeon: Wylene Simmer, MD;  Location: [place];  Service: Orthopedics;  Laterality: Left;  . TONSILLECTOMY    . TYMPANOPLASTY Left 06/25/2000  . TYMPANOSTOMY TUBE PLACEMENT  10/10/2009    There were no  vitals filed for this visit.  Subjective Assessment - 07/10/19 0903    Subjective  Pt states that she has not been performing MLD much but she feels that she has a pretty good idea how to perform it. She states that she currently has no pain and that she has not yet set up an appointment with [facility] to nature.    Pertinent History  Patient was diagnosed on 12/01/2018 with right grade II invasive ductal carcinoma breast cancer. She underwent a right lumpectomy and sentinel node biopsy (0/4 nodes positive) on 01/19/2019. Her Oncotype was low so did not need chemo.  Had radiation that was completed 04/21/2019.  She has some peeling and dry sking.  He is taking anti-estrogen therapy.It is ER/PR positive and HER2 negative with a Ki67 of 10%.    Patient Stated Goals  to get help with swelling in her breast    Currently in Pain?  No/denies    Multiple Pain Sites  No                       [facility] Adult PT Treatment/Exercise - 07/10/19 0001      Manual Therapy   Manual Therapy  Myofascial release;Manual Lymphatic Drainage (MLD);Passive ROM;Joint mobilization;Edema management  Edema Management  Pt signed up for time to get measured by Melissa with [employer] for prophylactic sleeve and compression bra for edema.     Joint Mobilization  Posterior Grade III 8x8 mobs on the clavicle due to pt reports of impingment at end range; decreased following mobilization.     Myofascial Release  myofascial release along the anterior chest wall, lateral chest wall from the R axilla into the medial R brachium, from the R shoulder to the inferior/medial aspect of the R breast due to tightness/adhesions noted in the anterior chest wall near the sternum, across the lateral R breast and cording noted in the R axilla; moderate improvement following moderate pull using cross hand and longitudinal myofascial release techniques.     Manual Lymphatic Drainage (MLD)  In supine: short neck, swimming in the terminus, bil  shoulder collectors, bil axilalry and R inguinal nodes, anterior inter-axillary and R axillo-inguinal anastomosis; superior and inferior breast toward corresponding anastomosis; re-worked anastomosis, in L side-lying posterior inter-axillary anastomosis, R axillo-inguinal anastomosis; supine: re-worked all surfaces then re-worked all surfaces and performed deep abdominals following myofascial release. Pt was educated throughout on direction, skin stretch    Passive ROM  P/ROM into flexion/abduction with end range stretch and intermittent myofascial release.              PT Education - 07/10/19 0959    Education Details  Pt was educated to stretch in the shower into flexion/abduction with easy end range stretch to help break up scar tissue and facilitate lymphatic flow.    Person(s) Educated  Patient    Methods  Explanation    Comprehension  Verbalized understanding          PT Long Term Goals - 07/04/19 1410      PT LONG TERM GOAL #1   Title  Pt ill be able to manage lymphedema in her right breast with self MLD, compression and exercise    Time  4    Period  Weeks    Status  New      PT LONG TERM GOAL #2   Title  Pt will report the feelings of fullness in her right breast are decreased by 50%    Time  4    Period  Weeks    Status  New      PT LONG TERM GOAL #3   Title  Pt will have a HEP for right shoulder stretching and strengthening  and a plan to continue with progressive strengthening at discharge    Time  4    Period  Weeks    Status  New            Plan - 07/10/19 0902    Clinical Impression Statement  Pt presents to physical therapy with continued edema in the R breast that decreases minimally following MLD for the R breast utilizing the posterior inter-axillary anastomosis to help move fluid. Tightness noted acros the anterior chest wall near the sternum and cording noted in the R axilla that was able to loosen with moderate pull using cross hand and  longitudinal myofascial release techniques. Pt reports decreased heaviness and tightness following her session. She was educated on how to stretch in the shower to help easily break up scar tissue and adhesions aand help fluid flow out of the area. Pt will benefit from continued POC at this time.    Personal Factors and Comorbidities  Comorbidity 2    Comorbidities  surgery with 4  nodes removed, radiation    Examination-Activity Limitations  Reach Overhead    Rehab Potential  Good    PT Frequency  3x / week    PT Duration  4 weeks    PT Treatment/Interventions  ADLs/Self Care Home Management;Therapeutic activities;Therapeutic exercise;Orthotic Fit/Training;Patient/family education;Manual techniques;Manual lymph drainage;Passive range of motion;Scar mobilization;Compression bandaging;Taping    PT Next Visit Plan  Continue teaching self MLd for right breast. Check to see if she got an appt with [facility]. sign her up with Melissa to get prophylactic sleefe and maybe compressin bra is she cannot get appointment with [facility] in a reasonable time.  Teach stretching to right shoulder and progress with shoulder strengthening. Transition to [employer] personal training session ( see Helene Kelp for details)    Consulted and Agree with Plan of Care  Patient       Patient will benefit from skilled therapeutic intervention in order to improve the following deficits and impairments:  Postural dysfunction, Decreased range of motion, Decreased knowledge of precautions, Impaired UE functional use, Pain, Decreased strength, Decreased scar mobility, Decreased skin integrity, Increased edema, Impaired perceived functional ability, Impaired flexibility, Increased fascial restricitons, Obesity  Visit Diagnosis: Disorder of the skin and subcutaneous tissue related to radiation, unspecified  Stiffness of right shoulder, not elsewhere classified  Muscle weakness (generalized)  Abnormal  posture  Malignant neoplasm of upper-inner quadrant of right breast in female, estrogen receptor positive [facility])  Aftercare following surgery for neoplasm     Problem List Patient Active Problem List   Diagnosis Date Noted  . Stress 04/26/2019  . Genetic testing 04/08/2019  . Family history of ovarian cancer   . Family history of brain cancer   . Family history of lung cancer   . Family history of breast cancer   . Malignant neoplasm of upper-inner quadrant of right breast in female, estrogen receptor positive ([place]) 12/13/2018  . Irritable bowel syndrome 11/30/2018  . Hypertriglyceridemia 09/30/2015  . OSA (obstructive sleep apnea) 10/25/2013  . Hearing loss 05/28/2011  . Essential hypertension 09/03/2008  . GOITER 07/14/2007    Ander Purpura, PT 07/10/2019, 10:02 AM  [place], [place], [address] Phone: [phone]   Fax:  [phone]  Name: [name] MRN: [id number] Date of Birth: [birth year]

## 2019-07-13 ENCOUNTER — Ambulatory Visit: Payer: 59

## 2019-07-13 ENCOUNTER — Other Ambulatory Visit: Payer: Self-pay

## 2019-07-13 DIAGNOSIS — L599 Disorder of the skin and subcutaneous tissue related to radiation, unspecified: Secondary | ICD-10-CM | POA: Diagnosis not present

## 2019-07-13 DIAGNOSIS — R293 Abnormal posture: Secondary | ICD-10-CM | POA: Diagnosis not present

## 2019-07-13 DIAGNOSIS — Z483 Aftercare following surgery for neoplasm: Secondary | ICD-10-CM | POA: Diagnosis not present

## 2019-07-13 DIAGNOSIS — Z170 Estrogen receptor positive status [ER+]: Secondary | ICD-10-CM

## 2019-07-13 DIAGNOSIS — M6281 Muscle weakness (generalized): Secondary | ICD-10-CM | POA: Diagnosis not present

## 2019-07-13 DIAGNOSIS — C50211 Malignant neoplasm of upper-inner quadrant of right female breast: Secondary | ICD-10-CM | POA: Diagnosis not present

## 2019-07-13 DIAGNOSIS — M25611 Stiffness of right shoulder, not elsewhere classified: Secondary | ICD-10-CM | POA: Diagnosis not present

## 2019-07-13 NOTE — Therapy (Signed)
[place], [place], [address] Phone: [phone]   Fax:  [phone]  Physical Therapy Treatment  Patient Details  Name: [name] MRN: [id number] Date of Birth: May 27, 1965 Referring Provider (PT): Dr. [name]   Encounter Date: 07/13/2019  PT End of Session - 07/13/19 0803    Visit Number  3    Number of Visits  9    Date for PT Re-Evaluation  08/07/19    PT Start Time  0802    PT Stop Time  0856    PT Time Calculation (min)  54 min    Activity Tolerance  Patient tolerated treatment well    Behavior During Therapy  [facility] for tasks assessed/performed       Past Medical History:  Diagnosis Date  . Eczema    legs  . Family history of brain cancer   . Family history of breast cancer   . Family history of lung cancer   . Family history of ovarian cancer   . Hallux valgus 06/2012   left  . Hammertoe 06/2012   left second  . Heart murmur    since birth, has had no testing, no problems  . History of frequent ear infections   . Hypertension    under control with med., has been on med. x 3 yr.  . Irritable bowel syndrome (IBS)    no current med.  . Wears hearing aid    bilateral    Past Surgical History:  Procedure Laterality Date  . BREAST LUMPECTOMY WITH RADIOACTIVE SEED AND SENTINEL LYMPH NODE BIOPSY Right 01/19/2019   Procedure: RIGHT BREAST LUMPECTOMY WITH RADIOACTIVE SEED AND SENTINEL LYMPH NODE BIOPSY;  Surgeon: [name], MD;  Location: [place];  Service: General;  Laterality: Right;  . EXPLORATORY TYMPANOTOMY Left 06/25/2000   with lysis of adhesions  . HAMMER TOE SURGERY Left 06/16/2012   Procedure: LEFT SCARF MODIFIED MCBRIDE AND SECOND Metatarsal WEIL;  Surgeon: Wylene Simmer, MD;  Location: [facility];  Service: Orthopedics;  Laterality: Left;  . TONSILLECTOMY    . TYMPANOPLASTY Left 06/25/2000  . TYMPANOSTOMY TUBE PLACEMENT  10/10/2009    There were no  vitals filed for this visit.  Subjective Assessment - 07/13/19 0803    Subjective  Pt reports that the massage is going well. She went and got a sports bra that she states helps with the swelling and gives her a lot more support.    Pertinent History  Patient was diagnosed on 12/01/2018 with right grade II invasive ductal carcinoma breast cancer. She underwent a right lumpectomy and sentinel node biopsy (0/4 nodes positive) on 01/19/2019. Her Oncotype was low so did not need chemo.  Had radiation that was completed 04/21/2019.  She has some peeling and dry sking.  He is taking anti-estrogen therapy.It is ER/PR positive and HER2 negative with a Ki67 of 10%.    Patient Stated Goals  to get help with swelling in her breast    Currently in Pain?  No/denies                       [facility] Adult PT Treatment/Exercise - 07/13/19 0001      Exercises   Exercises  Shoulder      Shoulder Exercises: Supine   Horizontal ABduction  Strengthening;Both;10 reps    Theraband Level (Shoulder Horizontal ABduction)  Level 1 (Yellow)    Horizontal ABduction Limitations  demonstration for correct  movement and VC for scapular squeeze at end range in order to activate appropriate musculature    External Rotation  Strengthening;Both;10 reps    Theraband Level (Shoulder External Rotation)  Level 1 (Yellow)    External Rotation Limitations  Demonstration for correct movement and VC for scapular squeeze at end range in order to activate appropriate musculature.     Flexion  Strengthening;Both;10 reps    Theraband Level (Shoulder Flexion)  Level 1 (Yellow)    Flexion Limitations  with resistance through into adduction throughout movement. VC 2x for R elbow extension.     Diagonals  Strengthening;Right;10 reps    Theraband Level (Shoulder Diagonals)  Level 1 (Yellow)    Diagonals Limitations  Demonstration for correct movement; pt had good form with return demonstration.       Manual Therapy   Manual Therapy   Myofascial release;Manual Lymphatic Drainage (MLD);Passive ROM;Joint mobilization    Joint Mobilization  Posterior Grade III 8x8 mobs on the clavicle due to pt reports of impingment at end range; decreased following mobilization.     Myofascial Release  myofascial release along the anterior chest wall, lateral chest wall from the R axilla into the medial R brachium, from the R shoulder to the inferior/medial aspect of the R breast due to tightness/adhesions noted in the anterior chest wall near the sternum, across the lateral R breast and cording continues in the R axilla; minimal improvement following myofascial release with pt reporting decreased tightness into further ROM    Manual Lymphatic Drainage (MLD)  In supine: short neck, swimming in the terminus, bil shoulder collectors, bil axilalry and R inguinal nodes, anterior inter-axillary and R axillo-inguinal anastomosis; superior and inferior breast toward corresponding anastomosis; re-worked anastomosis, in L side-lying posterior inter-axillary anastomosis, R axillo-inguinal anastomosis; supine: re-worked all surfaces then re-worked all surfaces and performed deep abdominals following myofascial release. Pt was educated throughout on direction, skin stretch    Passive ROM  P/ROM into flexion/abduction with end range stretch and intermittent myofascial release.              PT Education - 07/13/19 0855    Education Details  Pt was educated on the scapular series for strengthening and how to release her posterior rotator cuff due to tightness in this area.    Person(s) Educated  Patient    Methods  Explanation;Demonstration;Verbal cues;Handout    Comprehension  Returned demonstration;Verbalized understanding          PT Long Term Goals - 07/04/19 1410      PT LONG TERM GOAL #1   Title  Pt ill be able to manage lymphedema in her right breast with self MLD, compression and exercise    Time  4    Period  Weeks    Status  New      PT  LONG TERM GOAL #2   Title  Pt will report the feelings of fullness in her right breast are decreased by 50%    Time  4    Period  Weeks    Status  New      PT LONG TERM GOAL #3   Title  Pt will have a HEP for right shoulder stretching and strengthening  and a plan to continue with progressive strengthening at discharge    Time  4    Period  Weeks    Status  New            Plan - 07/13/19 0803    Clinical  Impression Statement  Pt continues with edema in the R breast that decreases minimally following MLD for the R breast continuing to utilize the posterior inter-axillary anastomosis to help move fluid. Tightness continues in the anterior chest wall near the sternum and cording in the R axilla; easy myofascial release with P/ROM this session with minimal improvement. Continued with stiffness in the R shoulder that improves with grade III mobilizations. She was taught the supine scapular series an how to release her posteiror Rotator cuff using a tennis ball or lacrosse ball this session to help improve shoulder mobility with decreased stiffness. Pt will benefit from continued POC at this time.    Personal Factors and Comorbidities  Comorbidity 2    Comorbidities  surgery with 4 nodes removed, radiation    Examination-Activity Limitations  Reach Overhead    Rehab Potential  Good    PT Frequency  3x / week    PT Duration  4 weeks    PT Treatment/Interventions  ADLs/Self Care Home Management;Therapeutic activities;Therapeutic exercise;Orthotic Fit/Training;Patient/family education;Manual techniques;Manual lymph drainage;Passive range of motion;Scar mobilization;Compression bandaging;Taping    PT Next Visit Plan  Continue teaching self MLd for right breast. Check to see if she got an appt with [facility]. sign her up with [facility] to get prophylactic sleefe and maybe compressin bra is she cannot get appointment with [facility] in a reasonable time.  Teach stretching to right shoulder  and progress with shoulder strengthening. Transition to [employer] personal training session ( see Helene Kelp for details)    Consulted and Agree with Plan of Care  Patient       Patient will benefit from skilled therapeutic intervention in order to improve the following deficits and impairments:  Postural dysfunction, Decreased range of motion, Decreased knowledge of precautions, Impaired UE functional use, Pain, Decreased strength, Decreased scar mobility, Decreased skin integrity, Increased edema, Impaired perceived functional ability, Impaired flexibility, Increased fascial restricitons, Obesity  Visit Diagnosis: Disorder of the skin and subcutaneous tissue related to radiation, unspecified  Stiffness of right shoulder, not elsewhere classified  Muscle weakness (generalized)  Abnormal posture  Malignant neoplasm of upper-inner quadrant of right breast in female, estrogen receptor positive [facility])  Aftercare following surgery for neoplasm     Problem List Patient Active Problem List   Diagnosis Date Noted  . Stress 04/26/2019  . Genetic testing 04/08/2019  . Family history of ovarian cancer   . Family history of brain cancer   . Family history of lung cancer   . Family history of breast cancer   . Malignant neoplasm of upper-inner quadrant of right breast in female, estrogen receptor positive ([place]) 12/13/2018  . Irritable bowel syndrome 11/30/2018  . Hypertriglyceridemia 09/30/2015  . OSA (obstructive sleep apnea) 10/25/2013  . Hearing loss 05/28/2011  . Essential hypertension 09/03/2008  . GOITER 07/14/2007    Ander Purpura, PT 07/13/2019, 9:02 AM  [place], [place], [address] Phone: [phone]   Fax:  [phone]  Name: BOLUWATIFE MUTCHLER MRN: [id number] Date of Birth: 1966/03/01

## 2019-07-13 NOTE — Patient Instructions (Signed)
Over Head Pull: Narrow and Wide Grip   Cancer Rehab [phone]   On back, knees bent, feet flat, band across thighs, elbows straight but relaxed. Pull hands apart (start). Keeping elbows straight, bring arms up and over head, hands toward floor. Keep pull steady on band. Hold momentarily. Return slowly, keeping pull steady, back to start. Then do same with a wider grip on the band (past shoulder width) Repeat _5-10__ times. Band color __yellow____   Side Pull: Double Arm   On back, knees bent, feet flat. Arms perpendicular to body, shoulder level, elbows straight but relaxed. Pull arms out to sides, elbows straight. Resistance band comes across collarbones, hands toward floor. Hold momentarily. Slowly return to starting position. Repeat _5-10__ times. Band color _yellow____   Sword   On back, knees bent, feet flat, left hand on left hip, right hand above left. Pull right arm DIAGONALLY (hip to shoulder) across chest. Bring right arm along head toward floor. Hold momentarily. Slowly return to starting position. Repeat _5-10__ times. Do with left arm. Band color _yellow_____   Shoulder Rotation: Double Arm   On back, knees bent, feet flat, elbows tucked at sides, bent 90, hands palms up. Pull hands apart and down toward floor, keeping elbows near sides. Hold momentarily. Slowly return to starting position. Repeat _5-10__ times. Band color __yellow____   Access Code: [email or web address] URL: [email or web address] Date: 07/13/2019 Prepared by: Tomma Rakers  Exercises Standing Posterior Rotator Cuff Mobilization - 1 x daily - 5 x weekly - 1 sets - 1 reps - lean on wall and hold until you feel pain decrease, then roll ball to next spot repeat until you feel like you have less tension/tenderness in the area hold

## 2019-07-17 ENCOUNTER — Other Ambulatory Visit: Payer: Self-pay

## 2019-07-17 ENCOUNTER — Ambulatory Visit: Payer: 59

## 2019-07-17 DIAGNOSIS — Z483 Aftercare following surgery for neoplasm: Secondary | ICD-10-CM

## 2019-07-17 DIAGNOSIS — M25611 Stiffness of right shoulder, not elsewhere classified: Secondary | ICD-10-CM

## 2019-07-17 DIAGNOSIS — R293 Abnormal posture: Secondary | ICD-10-CM | POA: Diagnosis not present

## 2019-07-17 DIAGNOSIS — Z170 Estrogen receptor positive status [ER+]: Secondary | ICD-10-CM

## 2019-07-17 DIAGNOSIS — M6281 Muscle weakness (generalized): Secondary | ICD-10-CM

## 2019-07-17 DIAGNOSIS — L599 Disorder of the skin and subcutaneous tissue related to radiation, unspecified: Secondary | ICD-10-CM | POA: Diagnosis not present

## 2019-07-17 DIAGNOSIS — C50211 Malignant neoplasm of upper-inner quadrant of right female breast: Secondary | ICD-10-CM | POA: Diagnosis not present

## 2019-07-17 NOTE — Therapy (Signed)
[place], [place], [address] Phone: [phone]   Fax:  [phone]  Physical Therapy Treatment  Patient Details  Name: [name] MRN: [id number] Date of Birth: [birth year] Referring Provider (PT): Dr. [name]   Encounter Date: 07/17/2019  PT End of Session - 07/17/19 1405    Visit Number  4    Number of Visits  9    Date for PT Re-Evaluation  08/07/19    PT Start Time  2536    PT Stop Time  1405    PT Time Calculation (min)  62 min    Activity Tolerance  Patient tolerated treatment well    Behavior During Therapy  [facility] for tasks assessed/performed       Past Medical History:  Diagnosis Date  . Eczema    legs  . Family history of brain cancer   . Family history of breast cancer   . Family history of lung cancer   . Family history of ovarian cancer   . Hallux valgus 06/2012   left  . Hammertoe 06/2012   left second  . Heart murmur    since birth, has had no testing, no problems  . History of frequent ear infections   . Hypertension    under control with med., has been on med. x 3 yr.  . Irritable bowel syndrome (IBS)    no current med.  . Wears hearing aid    bilateral    Past Surgical History:  Procedure Laterality Date  . BREAST LUMPECTOMY WITH RADIOACTIVE SEED AND SENTINEL LYMPH NODE BIOPSY Right 01/19/2019   Procedure: RIGHT BREAST LUMPECTOMY WITH RADIOACTIVE SEED AND SENTINEL LYMPH NODE BIOPSY;  Surgeon: [name], MD;  Location: [place];  Service: General;  Laterality: Right;  . EXPLORATORY TYMPANOTOMY Left 06/25/2000   with lysis of adhesions  . HAMMER TOE SURGERY Left 06/16/2012   Procedure: LEFT SCARF MODIFIED MCBRIDE AND SECOND Metatarsal WEIL;  Surgeon: Wylene Simmer, MD;  Location: [facility];  Service: Orthopedics;  Laterality: Left;  . TONSILLECTOMY    . TYMPANOPLASTY Left 06/25/2000  . TYMPANOSTOMY TUBE PLACEMENT  10/10/2009    There were no  vitals filed for this visit.  Subjective Assessment - 07/17/19 1306    Subjective  I've been doing the exercises she gave me last time and I feel like the band has really helped to loosen my Rt shoulder up. Also I feel like the swelling has started to improve since I was here last. I've been wearing my new compression bra for a week now and I think that has helped too.    Pertinent History  Patient was diagnosed on 12/01/2018 with right grade II invasive ductal carcinoma breast cancer. She underwent a right lumpectomy and sentinel node biopsy (0/4 nodes positive) on 01/19/2019. Her Oncotype was low so did not need chemo.  Had radiation that was completed 04/21/2019.  She has some peeling and dry sking.  He is taking anti-estrogen therapy.It is ER/PR positive and HER2 negative with a Ki67 of 10%.    Patient Stated Goals  to get help with swelling in her breast    Currently in Pain?  No/denies                       [facility] Adult PT Treatment/Exercise - 07/17/19 0001      Shoulder Exercises: Stretch   Corner Stretch  2 reps;10 seconds   varying  positions     Manual Therapy   Manual Lymphatic Drainage (MLD)  In supine: short neck, superficial and deep abdominals, bil shoulder collectors, Lt axillary and Rt inguinal nodes, anterior inter-axillary and R axillo-inguinal anastomosis; superior and inferior breast toward corresponding anastomosis; re-worked anastomosis, in Lt side-lying posterior inter-axillary anastomosis, R axillo-inguinal anastomosis; supine: re-worked all surfaces then re-worked all surfaces    Passive ROM  P/ROM into flexion/abduction with end range stretch and intermittent myofascial release.              PT Education - 07/17/19 1409    Education Details  Corner pectoralis stretch    Person(s) Educated  Patient    Methods  Explanation;Demonstration    Comprehension  Verbalized understanding;Returned demonstration          PT Long Term Goals - 07/04/19  1410      PT LONG TERM GOAL #1   Title  Pt ill be able to manage lymphedema in her right breast with self MLD, compression and exercise    Time  4    Period  Weeks    Status  New      PT LONG TERM GOAL #2   Title  Pt will report the feelings of fullness in her right breast are decreased by 50%    Time  4    Period  Weeks    Status  New      PT LONG TERM GOAL #3   Title  Pt will have a HEP for right shoulder stretching and strengthening  and a plan to continue with progressive strengthening at discharge    Time  4    Period  Weeks    Status  New            Plan - 07/17/19 1405    Clinical Impression Statement  Reviewed while performing manual lymph drainage on pt today. She repors feeling like she is doing well with light pressure and not sliding on skin. Issued red theraband for pt to be able to progress HEP when ready. Added corner pectoralis stretch to HEP having pt return demo. Her Rt superior breast felt softer by end fo session today and she repots noticing improvement with this as well as her end P/ROM.    Personal Factors and Comorbidities  Comorbidity 2    Comorbidities  surgery with 4 nodes removed, radiation    Examination-Activity Limitations  Reach Overhead    Stability/Clinical Decision Making  Stable/Uncomplicated    Rehab Potential  Good    PT Frequency  3x / week    PT Duration  4 weeks    PT Treatment/Interventions  ADLs/Self Care Home Management;Therapeutic activities;Therapeutic exercise;Orthotic Fit/Training;Patient/family education;Manual techniques;Manual lymph drainage;Passive range of motion;Scar mobilization;Compression bandaging;Taping    PT Next Visit Plan  Continue teaching self MLD for right breast. Check to see if she got an appt with [facility] (issued phone # and doctor script today). sign her up with [facility] to get prophylactic sleeve (though pt may schedule at [place])  Teach stretching to right shoulder and progress with shoulder  strengthening. Transition to [employer] personal training session ( see Helene Kelp for details)    PT Home Exercise Plan  Self MLD, supine scapular series, and corner pectoarais stretch    Recommended Other Services  Issued script for compression bra and prophylactic sleeve and gauntlet today    Consulted and Agree with Plan of Care  Patient  Patient will benefit from skilled therapeutic intervention in order to improve the following deficits and impairments:  Postural dysfunction, Decreased range of motion, Decreased knowledge of precautions, Impaired UE functional use, Pain, Decreased strength, Decreased scar mobility, Decreased skin integrity, Increased edema, Impaired perceived functional ability, Impaired flexibility, Increased fascial restricitons, Obesity  Visit Diagnosis: Disorder of the skin and subcutaneous tissue related to radiation, unspecified  Stiffness of right shoulder, not elsewhere classified  Muscle weakness (generalized)  Abnormal posture  Malignant neoplasm of upper-inner quadrant of right breast in female, estrogen receptor positive [facility])  Aftercare following surgery for neoplasm     Problem List Patient Active Problem List   Diagnosis Date Noted  . Stress 04/26/2019  . Genetic testing 04/08/2019  . Family history of ovarian cancer   . Family history of brain cancer   . Family history of lung cancer   . Family history of breast cancer   . Malignant neoplasm of upper-inner quadrant of right breast in female, estrogen receptor positive ([place]) 12/13/2018  . Irritable bowel syndrome 11/30/2018  . Hypertriglyceridemia 09/30/2015  . OSA (obstructive sleep apnea) 10/25/2013  . Hearing loss 05/28/2011  . Essential hypertension 09/03/2008  . GOITER 07/14/2007    Otelia Limes, PTA 07/17/2019, 3:07 PM  [place], [place], [address] Phone: [phone]   Fax:   [phone]  Name: [name] MRN: [id number] Date of Birth: 07/12/1965

## 2019-07-19 ENCOUNTER — Encounter: Payer: Self-pay | Admitting: Physical Therapy

## 2019-07-19 ENCOUNTER — Ambulatory Visit: Payer: 59 | Admitting: Physical Therapy

## 2019-07-19 ENCOUNTER — Other Ambulatory Visit: Payer: Self-pay

## 2019-07-19 DIAGNOSIS — L599 Disorder of the skin and subcutaneous tissue related to radiation, unspecified: Secondary | ICD-10-CM

## 2019-07-19 DIAGNOSIS — M25611 Stiffness of right shoulder, not elsewhere classified: Secondary | ICD-10-CM

## 2019-07-19 DIAGNOSIS — Z170 Estrogen receptor positive status [ER+]: Secondary | ICD-10-CM | POA: Diagnosis not present

## 2019-07-19 DIAGNOSIS — Z483 Aftercare following surgery for neoplasm: Secondary | ICD-10-CM

## 2019-07-19 DIAGNOSIS — M6281 Muscle weakness (generalized): Secondary | ICD-10-CM | POA: Diagnosis not present

## 2019-07-19 DIAGNOSIS — C50211 Malignant neoplasm of upper-inner quadrant of right female breast: Secondary | ICD-10-CM | POA: Diagnosis not present

## 2019-07-19 DIAGNOSIS — R293 Abnormal posture: Secondary | ICD-10-CM | POA: Diagnosis not present

## 2019-07-19 NOTE — Therapy (Signed)
[place], [place], [address] Phone: [phone]   Fax:  [phone]  Physical Therapy Treatment  Patient Details  Name: [name] MRN: [id number] Date of Birth: 1965/11/12 Referring Provider (PT): Dr. [name]   Encounter Date: 07/19/2019  PT End of Session - 07/19/19 1355    Visit Number  5    Number of Visits  9    Date for PT Re-Evaluation  08/07/19    PT Start Time  1410    PT Stop Time  1450    PT Time Calculation (min)  40 min    Activity Tolerance  Patient tolerated treatment well    Behavior During Therapy  [facility] for tasks assessed/performed       Past Medical History:  Diagnosis Date  . Eczema    legs  . Family history of brain cancer   . Family history of breast cancer   . Family history of lung cancer   . Family history of ovarian cancer   . Hallux valgus 06/2012   left  . Hammertoe 06/2012   left second  . Heart murmur    since birth, has had no testing, no problems  . History of frequent ear infections   . Hypertension    under control with med., has been on med. x 3 yr.  . Irritable bowel syndrome (IBS)    no current med.  . Wears hearing aid    bilateral    Past Surgical History:  Procedure Laterality Date  . BREAST LUMPECTOMY WITH RADIOACTIVE SEED AND SENTINEL LYMPH NODE BIOPSY Right 01/19/2019   Procedure: RIGHT BREAST LUMPECTOMY WITH RADIOACTIVE SEED AND SENTINEL LYMPH NODE BIOPSY;  Surgeon: [name], MD;  Location: [place];  Service: General;  Laterality: Right;  . EXPLORATORY TYMPANOTOMY Left 06/25/2000   with lysis of adhesions  . HAMMER TOE SURGERY Left 06/16/2012   Procedure: LEFT SCARF MODIFIED MCBRIDE AND SECOND Metatarsal WEIL;  Surgeon: Wylene Simmer, MD;  Location: [place];  Service: Orthopedics;  Laterality: Left;  . TONSILLECTOMY    . TYMPANOPLASTY Left 06/25/2000  . TYMPANOSTOMY TUBE PLACEMENT  10/10/2009    There were no  vitals filed for this visit.  Subjective Assessment - 07/19/19 1319    Subjective  Pt feels like her breast is improving.She is doing self MLD and exercises with the band.  She got a sports bra and feels that it is helping too.  She goes on Monday to get measured for a compression bra and will get measured for a sleeve/gauntlet  next Friday    Pertinent History  Patient was diagnosed on 12/01/2018 with right grade II invasive ductal carcinoma breast cancer. She underwent a right lumpectomy and sentinel node biopsy (0/4 nodes positive) on 01/19/2019. Her Oncotype was low so did not need chemo.  Had radiation that was completed 04/21/2019.  She has some peeling and dry sking.  He is taking anti-estrogen therapy.It is ER/PR positive and HER2 negative with a Ki67 of 10%.    Patient Stated Goals  to get help with swelling in her breast    Currently in Pain?  No/denies                        [facility] Adult PT Treatment/Exercise - 07/19/19 0001      Shoulder Exercises: Supine   Protraction  AROM;AAROM;Right;10 reps    Protraction Limitations  assist for extra stretch to  scapular mobility in end range       Shoulder Exercises: Seated   Other Seated Exercises  dowel rod flexion with deep stretch into end range       Manual Therapy   Manual Lymphatic Drainage (MLD)  In supine: short neck, superficial and deep abdominals, bil shoulder collectors, Lt axillary and Rt inguinal nodes, anterior inter-axillary and R axillo-inguinal anastomosis; superior and inferior breast toward corresponding anastomosis; re-worked anastomosis, in Lt side-lying posterior inter-axillary anastomosis, R axillo-inguinal anastomosis; supine: re-worked all surfaces then re-worked all surfaces    Passive ROM  P/ROM into flexion/abduction and diagonal patterns with end range stretch and intermittent myofascial release.                   PT Long Term Goals - 07/04/19 1410      PT LONG TERM GOAL #1   Title   Pt ill be able to manage lymphedema in her right breast with self MLD, compression and exercise    Time  4    Period  Weeks    Status  New      PT LONG TERM GOAL #2   Title  Pt will report the feelings of fullness in her right breast are decreased by 50%    Time  4    Period  Weeks    Status  New      PT LONG TERM GOAL #3   Title  Pt will have a HEP for right shoulder stretching and strengthening  and a plan to continue with progressive strengthening at discharge    Time  4    Period  Weeks    Status  New            Plan - 07/19/19 1355    Clinical Impression Statement  Pt is improving but continues with congestion, especailly in right breast, lateral chest and back.  Improvement noted with MLD.  Pt tolreated passive stretching well today and felt better after session    Personal Factors and Comorbidities  Comorbidity 2    Comorbidities  surgery with 4 nodes removed, radiation    Examination-Activity Limitations  Reach Overhead    Stability/Clinical Decision Making  Stable/Uncomplicated    Rehab Potential  Good    PT Frequency  3x / week    PT Duration  4 weeks    PT Treatment/Interventions  ADLs/Self Care Home Management;Therapeutic activities;Therapeutic exercise;Orthotic Fit/Training;Patient/family education;Manual techniques;Manual lymph drainage;Passive range of motion;Scar mobilization;Compression bandaging;Taping    PT Next Visit Plan  Continue performing and teaching self MLD for right breast. Check to see if she went to seconds to nature. second to nature (issued phone # and doctor script today). sign her up with [facility] to get prophylactic sleeve (though pt may schedule at [place])  Teach stretching to right shoulder and progress with shoulder strengthening. Transition to [employer] personal training session ( see Helene Kelp for details)    PT Home Exercise Plan  Self MLD, supine scapular series, and corner pectoarais stretch    Recommended Other Services   demographics sent to sunmend today    Consulted and Agree with Plan of Care  Patient       Patient will benefit from skilled therapeutic intervention in order to improve the following deficits and impairments:  Postural dysfunction, Decreased range of motion, Decreased knowledge of precautions, Impaired UE functional use, Pain, Decreased strength, Decreased scar mobility, Decreased skin integrity, Increased edema, Impaired perceived functional ability, Impaired flexibility, Increased  fascial restricitons, Obesity  Visit Diagnosis: Disorder of the skin and subcutaneous tissue related to radiation, unspecified  Stiffness of right shoulder, not elsewhere classified  Muscle weakness (generalized)  Aftercare following surgery for neoplasm     Problem List Patient Active Problem List   Diagnosis Date Noted  . Stress 04/26/2019  . Genetic testing 04/08/2019  . Family history of ovarian cancer   . Family history of brain cancer   . Family history of lung cancer   . Family history of breast cancer   . Malignant neoplasm of upper-inner quadrant of right breast in female, estrogen receptor positive ([place]) 12/13/2018  . Irritable bowel syndrome 11/30/2018  . Hypertriglyceridemia 09/30/2015  . OSA (obstructive sleep apnea) 10/25/2013  . Hearing loss 05/28/2011  . Essential hypertension 09/03/2008  . GOITER 07/14/2007   Donato Heinz. Owens Shark PT  Norwood Levo 07/19/2019, 1:59 PM  [place], [place], [address] Phone: [phone]   Fax:  [phone]  Name: [name] MRN: [id number] Date of Birth: 1966-03-04

## 2019-07-21 ENCOUNTER — Other Ambulatory Visit: Payer: Self-pay

## 2019-07-21 ENCOUNTER — Ambulatory Visit: Payer: 59

## 2019-07-21 DIAGNOSIS — R293 Abnormal posture: Secondary | ICD-10-CM

## 2019-07-21 DIAGNOSIS — Z170 Estrogen receptor positive status [ER+]: Secondary | ICD-10-CM | POA: Diagnosis not present

## 2019-07-21 DIAGNOSIS — Z483 Aftercare following surgery for neoplasm: Secondary | ICD-10-CM

## 2019-07-21 DIAGNOSIS — M6281 Muscle weakness (generalized): Secondary | ICD-10-CM | POA: Diagnosis not present

## 2019-07-21 DIAGNOSIS — M25611 Stiffness of right shoulder, not elsewhere classified: Secondary | ICD-10-CM | POA: Diagnosis not present

## 2019-07-21 DIAGNOSIS — L599 Disorder of the skin and subcutaneous tissue related to radiation, unspecified: Secondary | ICD-10-CM

## 2019-07-21 DIAGNOSIS — C50211 Malignant neoplasm of upper-inner quadrant of right female breast: Secondary | ICD-10-CM | POA: Diagnosis not present

## 2019-07-21 NOTE — Therapy (Signed)
[place], [place], [address] Phone: [phone]   Fax:  [phone]  Physical Therapy Treatment  Patient Details  Name: [name] MRN: [id number] Date of Birth: 01-Feb-1966 Referring Provider (PT): Dr. [name]   Encounter Date: 07/21/2019  PT End of Session - 07/21/19 0905    Visit Number  6    Number of Visits  9    Date for PT Re-Evaluation  08/07/19    PT Start Time  0905    PT Stop Time  0948    PT Time Calculation (min)  43 min    Activity Tolerance  Patient tolerated treatment well    Behavior During Therapy  [facility] for tasks assessed/performed       Past Medical History:  Diagnosis Date  . Eczema    legs  . Family history of brain cancer   . Family history of breast cancer   . Family history of lung cancer   . Family history of ovarian cancer   . Hallux valgus 06/2012   left  . Hammertoe 06/2012   left second  . Heart murmur    since birth, has had no testing, no problems  . History of frequent ear infections   . Hypertension    under control with med., has been on med. x 3 yr.  . Irritable bowel syndrome (IBS)    no current med.  . Wears hearing aid    bilateral    Past Surgical History:  Procedure Laterality Date  . BREAST LUMPECTOMY WITH RADIOACTIVE SEED AND SENTINEL LYMPH NODE BIOPSY Right 01/19/2019   Procedure: RIGHT BREAST LUMPECTOMY WITH RADIOACTIVE SEED AND SENTINEL LYMPH NODE BIOPSY;  Surgeon: [name], MD;  Location: [place];  Service: General;  Laterality: Right;  . EXPLORATORY TYMPANOTOMY Left 06/25/2000   with lysis of adhesions  . HAMMER TOE SURGERY Left 06/16/2012   Procedure: LEFT SCARF MODIFIED MCBRIDE AND SECOND Metatarsal WEIL;  Surgeon: Wylene Simmer, MD;  Location: [place];  Service: Orthopedics;  Laterality: Left;  . TONSILLECTOMY    . TYMPANOPLASTY Left 06/25/2000  . TYMPANOSTOMY TUBE PLACEMENT  10/10/2009    There were no  vitals filed for this visit.  Subjective Assessment - 07/21/19 0905    Subjective  Pt reports that she is feeling pretty good and continues to feel confident performing MLD at home. She is getting measured for a compression bra on Monday    Pertinent History  Patient was diagnosed on 12/01/2018 with right grade II invasive ductal carcinoma breast cancer. She underwent a right lumpectomy and sentinel node biopsy (0/4 nodes positive) on 01/19/2019. Her Oncotype was low so did not need chemo.  Had radiation that was completed 04/21/2019.  She has some peeling and dry sking.  He is taking anti-estrogen therapy.It is ER/PR positive and HER2 negative with a Ki67 of 10%.    Patient Stated Goals  to get help with swelling in her breast    Currently in Pain?  No/denies    Multiple Pain Sites  No                        [facility] Adult PT Treatment/Exercise - 07/21/19 0001      Shoulder Exercises: Standing   Other Standing Exercises  3 way shoulder with 1# weights into flexion, abduction and scaption 10x each with occasional VC to prevent R shoulder hiking and movement into scaption compensation with  abduction on the R.       Manual Therapy   Manual Therapy  Manual Lymphatic Drainage (MLD);Passive ROM;Soft tissue mobilization    Soft tissue mobilization  Light STM over cording in the R axilla with decreased visibly observable cords by end of session.     Manual Lymphatic Drainage (MLD)  In supine: short neck, swimming in the terminus, bil shoulder collectors, bil axillary and R inguinal nodes, anterior inter-axillary and R axillo-inguinal anastomosis; superior and inferior breast toward corresponding anastomosis; re-worked anastomosis, in Lt side-lying posterior inter-axillary anastomosis, R axillo-inguinal anastomosis, Figure 7 on the R posterior quadrant then re-worked R axillo-inguinal anastomosis; supine: re-worked all surfaces then deep abdominals    Passive ROM  P/ROM into flexion/abduction  and diagonal patterns with end range stretch and intermittent STM              PT Education - 07/21/19 0948    Education Details  Discussed performing abduction with arm fully into horizontal abduction and not letting herself move into scaption to compensate for lost movement.    Person(s) Educated  Patient    Methods  Explanation;Demonstration    Comprehension  Verbalized understanding          PT Long Term Goals - 07/04/19 1410      PT LONG TERM GOAL #1   Title  Pt ill be able to manage lymphedema in her right breast with self MLD, compression and exercise    Time  4    Period  Weeks    Status  New      PT LONG TERM GOAL #2   Title  Pt will report the feelings of fullness in her right breast are decreased by 50%    Time  4    Period  Weeks    Status  New      PT LONG TERM GOAL #3   Title  Pt will have a HEP for right shoulder stretching and strengthening  and a plan to continue with progressive strengthening at discharge    Time  4    Period  Weeks    Status  New            Plan - 07/21/19 0905    Clinical Impression Statement  Pt continues with edema in her R breast and upper quadrant that continues to improve minimally with MLD. She continues with cords in her R axilla that improve with light petrissage in the axilla with abduction; flexion demonstrates no increased flexion on cords. Pt was able to tolerate standing very light weight resistance exercises this session with occasional voice cues to prevent R shoulder hiking and compensation into scaption with abduction to prevent compensation. Pt will benefit from continued POC at this time.    Personal Factors and Comorbidities  Comorbidity 2    Comorbidities  surgery with 4 nodes removed, radiation    Examination-Activity Limitations  Reach Overhead    Rehab Potential  Good    PT Frequency  3x / week    PT Duration  4 weeks    PT Treatment/Interventions  ADLs/Self Care Home Management;Therapeutic  activities;Therapeutic exercise;Orthotic Fit/Training;Patient/family education;Manual techniques;Manual lymph drainage;Passive range of motion;Scar mobilization;Compression bandaging;Taping    PT Next Visit Plan  Continue performing and teaching self MLD for right breast. Check to see if she went to seconds to nature. second to nature (issued phone # and doctor script today). sign her up with [facility] to get prophylactic sleeve (though pt may schedule at  [facility])  Teach stretching to right shoulder and progress with shoulder strengthening. Transition to [employer] personal training session ( see Helene Kelp for details)    PT Home Exercise Plan  Self MLD, supine scapular series, and corner pectoarais stretch    Consulted and Agree with Plan of Care  Patient       Patient will benefit from skilled therapeutic intervention in order to improve the following deficits and impairments:  Postural dysfunction, Decreased range of motion, Decreased knowledge of precautions, Impaired UE functional use, Pain, Decreased strength, Decreased scar mobility, Decreased skin integrity, Increased edema, Impaired perceived functional ability, Impaired flexibility, Increased fascial restricitons, Obesity  Visit Diagnosis: Disorder of the skin and subcutaneous tissue related to radiation, unspecified  Stiffness of right shoulder, not elsewhere classified  Muscle weakness (generalized)  Aftercare following surgery for neoplasm  Abnormal posture  Malignant neoplasm of upper-inner quadrant of right breast in female, estrogen receptor positive ([place])     Problem List Patient Active Problem List   Diagnosis Date Noted  . Stress 04/26/2019  . Genetic testing 04/08/2019  . Family history of ovarian cancer   . Family history of brain cancer   . Family history of lung cancer   . Family history of breast cancer   . Malignant neoplasm of upper-inner quadrant of right breast in female, estrogen receptor positive  ([place]) 12/13/2018  . Irritable bowel syndrome 11/30/2018  . Hypertriglyceridemia 09/30/2015  . OSA (obstructive sleep apnea) 10/25/2013  . Hearing loss 05/28/2011  . Essential hypertension 09/03/2008  . GOITER 07/14/2007    Ander Purpura, PT 07/21/2019, 9:56 AM  [place], [place], [address] Phone: [phone]   Fax:  [phone]  Name: JAKHIA BUXTON MRN: [id number] Date of Birth: April 10, 1965

## 2019-07-24 ENCOUNTER — Ambulatory Visit: Payer: 59

## 2019-07-24 ENCOUNTER — Other Ambulatory Visit: Payer: Self-pay

## 2019-07-24 DIAGNOSIS — M25611 Stiffness of right shoulder, not elsewhere classified: Secondary | ICD-10-CM

## 2019-07-24 DIAGNOSIS — L599 Disorder of the skin and subcutaneous tissue related to radiation, unspecified: Secondary | ICD-10-CM

## 2019-07-24 DIAGNOSIS — Z483 Aftercare following surgery for neoplasm: Secondary | ICD-10-CM | POA: Diagnosis not present

## 2019-07-24 DIAGNOSIS — M6281 Muscle weakness (generalized): Secondary | ICD-10-CM

## 2019-07-24 DIAGNOSIS — R293 Abnormal posture: Secondary | ICD-10-CM | POA: Diagnosis not present

## 2019-07-24 DIAGNOSIS — C50211 Malignant neoplasm of upper-inner quadrant of right female breast: Secondary | ICD-10-CM | POA: Diagnosis not present

## 2019-07-24 DIAGNOSIS — Z170 Estrogen receptor positive status [ER+]: Secondary | ICD-10-CM | POA: Diagnosis not present

## 2019-07-24 NOTE — Patient Instructions (Signed)

## 2019-07-24 NOTE — Therapy (Signed)
[place], [place], [address] Phone: [phone]   Fax:  [phone]  Physical Therapy Treatment  Patient Details  Name: [name] MRN: [id number] Date of Birth: September 29, 1965 Referring Provider (PT): Dr. [name]   Encounter Date: 07/24/2019  PT End of Session - 07/24/19 1400    Visit Number  7    Number of Visits  9    Date for PT Re-Evaluation  08/07/19    PT Start Time  1306    PT Stop Time  1400    PT Time Calculation (min)  54 min    Activity Tolerance  Patient tolerated treatment well    Behavior During Therapy  [facility] for tasks assessed/performed       Past Medical History:  Diagnosis Date  . Eczema    legs  . Family history of brain cancer   . Family history of breast cancer   . Family history of lung cancer   . Family history of ovarian cancer   . Hallux valgus 06/2012   left  . Hammertoe 06/2012   left second  . Heart murmur    since birth, has had no testing, no problems  . History of frequent ear infections   . Hypertension    under control with med., has been on med. x 3 yr.  . Irritable bowel syndrome (IBS)    no current med.  . Wears hearing aid    bilateral    Past Surgical History:  Procedure Laterality Date  . BREAST LUMPECTOMY WITH RADIOACTIVE SEED AND SENTINEL LYMPH NODE BIOPSY Right 01/19/2019   Procedure: RIGHT BREAST LUMPECTOMY WITH RADIOACTIVE SEED AND SENTINEL LYMPH NODE BIOPSY;  Surgeon: [name], MD;  Location: [place];  Service: General;  Laterality: Right;  . EXPLORATORY TYMPANOTOMY Left 06/25/2000   with lysis of adhesions  . HAMMER TOE SURGERY Left 06/16/2012   Procedure: LEFT SCARF MODIFIED MCBRIDE AND SECOND Metatarsal WEIL;  Surgeon: Wylene Simmer, MD;  Location: [place];  Service: Orthopedics;  Laterality: Left;  . TONSILLECTOMY    . TYMPANOPLASTY Left 06/25/2000  . TYMPANOSTOMY TUBE PLACEMENT  10/10/2009    There were no  vitals filed for this visit.  Subjective Assessment - 07/24/19 1311    Subjective  I get measured for my compression bra later today. So far I think my breast is doing okay. I can tell it's getting softer and feeling less heavy.    Pertinent History  Patient was diagnosed on 12/01/2018 with right grade II invasive ductal carcinoma breast cancer. She underwent a right lumpectomy and sentinel node biopsy (0/4 nodes positive) on 01/19/2019. Her Oncotype was low so did not need chemo.  Had radiation that was completed 04/21/2019.  She has some peeling and dry sking.  He is taking anti-estrogen therapy.It is ER/PR positive and HER2 negative with a Ki67 of 10%.    Patient Stated Goals  to get help with swelling in her breast    Currently in Pain?  No/denies         [facility] PT Assessment - 07/24/19 0001      AROM   Right Shoulder Flexion  153 Degrees    Right Shoulder ABduction  160 Degrees                    [facility] Adult PT Treatment/Exercise - 07/24/19 0001      Shoulder Exercises: Standing   Other Standing Exercises  3  way shoulder raises with back, shoulders and head against wall, 1# weights into flexion, abduction and scaption 10x each with occasional VC to prevent R shoulder hiking and movement into scaption compensation with abduction on the R.       Shoulder Exercises: Therapy Ball   Flexion  Both;10 reps   forward lean into end of stretch   Flexion Limitations  Pt returned therapist demo    ABduction  Right;5 reps   same side lean into end of stretch   ABduction Limitations  Returning therapist demo      Shoulder Exercises: Stretch   Corner Stretch  3 reps;20 seconds   in doorway     Manual Therapy   Soft tissue mobilization  Light STM over cording in the R axilla with decreased visibly observable cords by end of session.     Manual Lymphatic Drainage (MLD)  In supine: short neck, 5 diaphragmatic breaths, bil shoulder collectors, Lt axillary and R inguinal nodes,  anterior inter-axillary and R axillo-inguinal anastomosis; superior and inferior breast toward corresponding anastomosis; re-worked anastomosis, in Lt side-lying posterior inter-axillary anastomosis, R axillo-inguinal anastomosis, then; supine: re-worked all surfaces     Passive ROM  P/ROM into flexion/abduction and diagonal patterns with end range stretch and intermittent STM              PT Education - 07/24/19 1353    Education Details  Standing 3 way raises with 1 lb    Person(s) Educated  Patient    Methods  Explanation;Demonstration;Handout    Comprehension  Verbalized understanding;Returned demonstration          PT Long Term Goals - 07/04/19 1410      PT LONG TERM GOAL #1   Title  Pt ill be able to manage lymphedema in her right breast with self MLD, compression and exercise    Time  4    Period  Weeks    Status  New      PT LONG TERM GOAL #2   Title  Pt will report the feelings of fullness in her right breast are decreased by 50%    Time  4    Period  Weeks    Status  New      PT LONG TERM GOAL #3   Title  Pt will have a HEP for right shoulder stretching and strengthening  and a plan to continue with progressive strengthening at discharge    Time  4    Period  Weeks    Status  New            Plan - 07/24/19 1400    Clinical Impression Statement  Pt reports her Rt breast swelling is improving. She has been compliant with self MLD and is getting measured for a compression bra today, then a prophylactic compression sleeve Friday. Progressed her HEP to include 3 way raises which she tolerated well. Overall pt is progressing well.    Personal Factors and Comorbidities  Comorbidity 2    Comorbidities  surgery with 4 nodes removed, radiation    Examination-Activity Limitations  Reach Overhead    Stability/Clinical Decision Making  Stable/Uncomplicated    Rehab Potential  Good    PT Frequency  3x / week    PT Duration  4 weeks    PT Treatment/Interventions   ADLs/Self Care Home Management;Therapeutic activities;Therapeutic exercise;Orthotic Fit/Training;Patient/family education;Manual techniques;Manual lymph drainage;Passive range of motion;Scar mobilization;Compression bandaging;Taping    PT Next Visit Plan  Continue performing and  teaching self MLD for right breast. Teach stretching to right shoulder and progress with shoulder strengthening. Transition to [employer] personal training session ( see Helene Kelp for details)    PT Home Exercise Plan  Self MLD, supine scapular series, and corner pectoarais stretch; bil UE 3 way raises    Consulted and Agree with Plan of Care  Patient       Patient will benefit from skilled therapeutic intervention in order to improve the following deficits and impairments:  Postural dysfunction, Decreased range of motion, Decreased knowledge of precautions, Impaired UE functional use, Pain, Decreased strength, Decreased scar mobility, Decreased skin integrity, Increased edema, Impaired perceived functional ability, Impaired flexibility, Increased fascial restricitons, Obesity  Visit Diagnosis: Disorder of the skin and subcutaneous tissue related to radiation, unspecified  Stiffness of right shoulder, not elsewhere classified  Muscle weakness (generalized)  Aftercare following surgery for neoplasm     Problem List Patient Active Problem List   Diagnosis Date Noted  . Stress 04/26/2019  . Genetic testing 04/08/2019  . Family history of ovarian cancer   . Family history of brain cancer   . Family history of lung cancer   . Family history of breast cancer   . Malignant neoplasm of upper-inner quadrant of right breast in female, estrogen receptor positive ([place]) 12/13/2018  . Irritable bowel syndrome 11/30/2018  . Hypertriglyceridemia 09/30/2015  . OSA (obstructive sleep apnea) 10/25/2013  . Hearing loss 05/28/2011  . Essential hypertension 09/03/2008  . GOITER 07/14/2007    Otelia Limes,  PTA 07/24/2019, 2:05 PM  [place], [place], [address] Phone: [phone]   Fax:  [phone]  Name: JAIDAN STACHNIK MRN: [id number] Date of Birth: Jun 22, 1965

## 2019-07-25 ENCOUNTER — Other Ambulatory Visit: Payer: Self-pay | Admitting: Family Medicine

## 2019-07-25 DIAGNOSIS — I10 Essential (primary) hypertension: Secondary | ICD-10-CM

## 2019-07-25 MED FILL — LOSARTAN-HCTZ 50-12.5 MG TA: 50-12.5 | 90 days supply | Qty: 90 | Fill #0

## 2019-07-25 MED FILL — FLUoxetine HCL 10 MG CAPS: 10 | 90 days supply | Qty: 180 | Fill #0

## 2019-07-25 MED FILL — TAMOXIFEN 20 MG TABLET: 20 | 90 days supply | Qty: 90 | Fill #1

## 2019-07-26 ENCOUNTER — Encounter: Payer: Self-pay | Admitting: Physical Therapy

## 2019-07-26 ENCOUNTER — Ambulatory Visit: Payer: 59 | Admitting: Physical Therapy

## 2019-07-26 ENCOUNTER — Other Ambulatory Visit: Payer: Self-pay

## 2019-07-26 DIAGNOSIS — R293 Abnormal posture: Secondary | ICD-10-CM

## 2019-07-26 DIAGNOSIS — M6281 Muscle weakness (generalized): Secondary | ICD-10-CM | POA: Diagnosis not present

## 2019-07-26 DIAGNOSIS — Z483 Aftercare following surgery for neoplasm: Secondary | ICD-10-CM

## 2019-07-26 DIAGNOSIS — C50211 Malignant neoplasm of upper-inner quadrant of right female breast: Secondary | ICD-10-CM | POA: Diagnosis not present

## 2019-07-26 DIAGNOSIS — M25611 Stiffness of right shoulder, not elsewhere classified: Secondary | ICD-10-CM

## 2019-07-26 DIAGNOSIS — Z170 Estrogen receptor positive status [ER+]: Secondary | ICD-10-CM | POA: Diagnosis not present

## 2019-07-26 DIAGNOSIS — L599 Disorder of the skin and subcutaneous tissue related to radiation, unspecified: Secondary | ICD-10-CM | POA: Diagnosis not present

## 2019-07-26 NOTE — Therapy (Signed)
[place], [place], [address] Phone: [phone]   Fax:  [phone]  Physical Therapy Treatment  Patient Details  Name: [name] MRN: [id number] Date of Birth: 12-Oct-1965 Referring Provider (PT): Dr. [name]   Encounter Date: 07/26/2019  PT End of Session - 07/26/19 1354    Visit Number  8    Number of Visits  9    Date for PT Re-Evaluation  08/07/19    PT Start Time  1300    PT Stop Time  1345    PT Time Calculation (min)  45 min    Activity Tolerance  Patient tolerated treatment well    Behavior During Therapy  [facility] for tasks assessed/performed       Past Medical History:  Diagnosis Date  . Eczema    legs  . Family history of brain cancer   . Family history of breast cancer   . Family history of lung cancer   . Family history of ovarian cancer   . Hallux valgus 06/2012   left  . Hammertoe 06/2012   left second  . Heart murmur    since birth, has had no testing, no problems  . History of frequent ear infections   . Hypertension    under control with med., has been on med. x 3 yr.  . Irritable bowel syndrome (IBS)    no current med.  . Wears hearing aid    bilateral    Past Surgical History:  Procedure Laterality Date  . BREAST LUMPECTOMY WITH RADIOACTIVE SEED AND SENTINEL LYMPH NODE BIOPSY Right 01/19/2019   Procedure: RIGHT BREAST LUMPECTOMY WITH RADIOACTIVE SEED AND SENTINEL LYMPH NODE BIOPSY;  Surgeon: [name], MD;  Location: [place];  Service: General;  Laterality: Right;  . EXPLORATORY TYMPANOTOMY Left 06/25/2000   with lysis of adhesions  . HAMMER TOE SURGERY Left 06/16/2012   Procedure: LEFT SCARF MODIFIED MCBRIDE AND SECOND Metatarsal WEIL;  Surgeon: Wylene Simmer, MD;  Location: [place];  Service: Orthopedics;  Laterality: Left;  . TONSILLECTOMY    . TYMPANOPLASTY Left 06/25/2000  . TYMPANOSTOMY TUBE PLACEMENT  10/10/2009    There were no  vitals filed for this visit.  Subjective Assessment - 07/26/19 1308    Subjective  Pt state she is doing better. She has her compression bras and the are helping. She will be measured for prophylactic sleeve on Friday.  She is interested in having [place] personal training sessions upon completion of PT    Pertinent History  Patient was diagnosed on 12/01/2018 with right grade II invasive ductal carcinoma breast cancer. She underwent a right lumpectomy and sentinel node biopsy (0/4 nodes positive) on 01/19/2019. Her Oncotype was low so did not need chemo.  Had radiation that was completed 04/21/2019.  She has some peeling and dry sking.  He is taking anti-estrogen therapy.It is ER/PR positive and HER2 negative with a Ki67 of 10%.    Patient Stated Goals  to get help with swelling in her breast    Currently in Pain?  No/denies         [facility] PT Assessment - 07/26/19 0001      AROM   Right Shoulder Flexion  165 Degrees    Right Shoulder ABduction  170 Degrees                    [facility] Adult PT Treatment/Exercise - 07/26/19 0001  Exercises   Exercises  Shoulder;Other Exercises    Other Exercises   sent an email to Fatima Blank personal trainer for [employer] to set up personal training sessions at discharge       Shoulder Exercises: Standing   External Rotation  Strengthening;Right;10 reps;Theraband    Theraband Level (Shoulder External Rotation)  Level 1 (Yellow)    Retraction  Strengthening;Right;Left;10 reps;Theraband    Theraband Level (Shoulder Retraction)  Level 1 (Yellow)      Shoulder Exercises: Pulleys   Flexion  2 minutes    ABduction  2 minutes      Shoulder Exercises: Therapy Ball   Flexion  Both;10 reps   forward lean into end of stretch   Flexion Limitations  Pt returned therapist demo      Manual Therapy   Edema Management  patch of small dotted foam to wear inside ocmpression bra     Manual Lymphatic Drainage (MLD)  In supine: short neck, 5  diaphragmatic breaths, bil shoulder collectors, Lt axillary and R inguinal nodes, anterior inter-axillary and R axillo-inguinal anastomosis; superior and inferior breast toward corresponding anastomosis; re-worked anastomosis, in Lt side-lying posterior inter-axillary anastomosis, R axillo-inguinal anastomosis, then; supine: re-worked all surfaces     Passive ROM  P/ROM into flexion/abduction and diagonal patterns with end range stretch and intermittent STM                   PT Long Term Goals - 07/26/19 1320      PT LONG TERM GOAL #1   Title  Pt ill be able to manage lymphedema in her right breast with self MLD, compression and exercise    Status  Achieved      PT LONG TERM GOAL #2   Title  Pt will report the feelings of fullness in her right breast are decreased by 50%    Status  Achieved      PT LONG TERM GOAL #3   Title  Pt will have a HEP for right shoulder stretching and strengthening  and a plan to continue with progressive strengthening at discharge    Status  Achieved            Plan - 07/26/19 1355    Clinical Impression Statement  Pt is doing very well and feel her breast lymphedema is resolving with only firmer area at medial portion of right breast.  Added a foam patch to wear inside compression bra to see if that will help with this area.  She feels that she can manage her lymphedema at home and will follow up with [facility] personal trainer for more exercise  She will have her final treatment on Friday and be measaured for prophylactic sleeve. She will be discharged from PT at the time    Personal Factors and Comorbidities  Comorbidity 2    Comorbidities  surgery with 4 nodes removed, radiation    Stability/Clinical Decision Making  Stable/Uncomplicated    Rehab Potential  Good    PT Frequency  3x / week    PT Duration  4 weeks    PT Treatment/Interventions  ADLs/Self Care Home Management;Therapeutic activities;Therapeutic exercise;Orthotic  Fit/Training;Patient/family education;Manual techniques;Manual lymph drainage;Passive range of motion;Scar mobilization;Compression bandaging;Taping    PT Next Visit Plan  Final review of self MLD for right breast see if foam patch helped with softening medial breast.  Discharge    PT Home Exercise Plan  Self MLD, supine scapular series, and corner pectoarais stretch; bil UE 3  way raises    Consulted and Agree with Plan of Care  Patient       Patient will benefit from skilled therapeutic intervention in order to improve the following deficits and impairments:  Postural dysfunction, Decreased range of motion, Decreased knowledge of precautions, Impaired UE functional use, Pain, Decreased strength, Decreased scar mobility, Decreased skin integrity, Increased edema, Impaired perceived functional ability, Impaired flexibility, Increased fascial restricitons, Obesity  Visit Diagnosis: Disorder of the skin and subcutaneous tissue related to radiation, unspecified  Stiffness of right shoulder, not elsewhere classified  Muscle weakness (generalized)  Aftercare following surgery for neoplasm  Abnormal posture     Problem List Patient Active Problem List   Diagnosis Date Noted  . Stress 04/26/2019  . Genetic testing 04/08/2019  . Family history of ovarian cancer   . Family history of brain cancer   . Family history of lung cancer   . Family history of breast cancer   . Malignant neoplasm of upper-inner quadrant of right breast in female, estrogen receptor positive ([place]) 12/13/2018  . Irritable bowel syndrome 11/30/2018  . Hypertriglyceridemia 09/30/2015  . OSA (obstructive sleep apnea) 10/25/2013  . Hearing loss 05/28/2011  . Essential hypertension 09/03/2008  . GOITER 07/14/2007   Donato Heinz. Owens Shark PT  Norwood Levo 07/26/2019, 1:59 PM  [place], [place], [address] Phone: [phone]   Fax:   [phone]  Name: LALIA LOUDON MRN: [id number] Date of Birth: 15-Jul-1965

## 2019-07-28 ENCOUNTER — Ambulatory Visit: Payer: 59

## 2019-07-28 ENCOUNTER — Other Ambulatory Visit: Payer: Self-pay

## 2019-07-28 DIAGNOSIS — R293 Abnormal posture: Secondary | ICD-10-CM

## 2019-07-28 DIAGNOSIS — Z170 Estrogen receptor positive status [ER+]: Secondary | ICD-10-CM

## 2019-07-28 DIAGNOSIS — M25611 Stiffness of right shoulder, not elsewhere classified: Secondary | ICD-10-CM

## 2019-07-28 DIAGNOSIS — Z483 Aftercare following surgery for neoplasm: Secondary | ICD-10-CM | POA: Diagnosis not present

## 2019-07-28 DIAGNOSIS — L599 Disorder of the skin and subcutaneous tissue related to radiation, unspecified: Secondary | ICD-10-CM | POA: Diagnosis not present

## 2019-07-28 DIAGNOSIS — M6281 Muscle weakness (generalized): Secondary | ICD-10-CM

## 2019-07-28 DIAGNOSIS — C50211 Malignant neoplasm of upper-inner quadrant of right female breast: Secondary | ICD-10-CM | POA: Diagnosis not present

## 2019-07-28 NOTE — Therapy (Signed)
[place], [place], [address] Phone: [phone]   Fax:  [phone]  Physical Therapy Discharge  Patient Details  Name: [name] MRN: [id number] Date of Birth: 09-30-1965 Referring Provider (PT): Dr. [name]   Encounter Date: 07/28/2019  PT End of Session - 07/28/19 1002    Visit Number  9    Number of Visits  9    Date for PT Re-Evaluation  08/07/19    PT Start Time  0957    PT Stop Time  1027    PT Time Calculation (min)  30 min    Activity Tolerance  Patient tolerated treatment well    Behavior During Therapy  [facility] for tasks assessed/performed       Past Medical History:  Diagnosis Date  . Eczema    legs  . Family history of brain cancer   . Family history of breast cancer   . Family history of lung cancer   . Family history of ovarian cancer   . Hallux valgus 06/2012   left  . Hammertoe 06/2012   left second  . Heart murmur    since birth, has had no testing, no problems  . History of frequent ear infections   . Hypertension    under control with med., has been on med. x 3 yr.  . Irritable bowel syndrome (IBS)    no current med.  . Wears hearing aid    bilateral    Past Surgical History:  Procedure Laterality Date  . BREAST LUMPECTOMY WITH RADIOACTIVE SEED AND SENTINEL LYMPH NODE BIOPSY Right 01/19/2019   Procedure: RIGHT BREAST LUMPECTOMY WITH RADIOACTIVE SEED AND SENTINEL LYMPH NODE BIOPSY;  Surgeon: [name], MD;  Location: [facility];  Service: General;  Laterality: Right;  . EXPLORATORY TYMPANOTOMY Left 06/25/2000   with lysis of adhesions  . HAMMER TOE SURGERY Left 06/16/2012   Procedure: LEFT SCARF MODIFIED MCBRIDE AND SECOND Metatarsal WEIL;  Surgeon: Wylene Simmer, MD;  Location: [facility];  Service: Orthopedics;  Laterality: Left;  . TONSILLECTOMY    . TYMPANOPLASTY Left 06/25/2000  . TYMPANOSTOMY TUBE PLACEMENT  10/10/2009    There were no  vitals filed for this visit.  Subjective Assessment - 07/28/19 1003    Subjective  Pt states that she likes the foam it makes her breast feel softer and less full. She is ready for discharge today and is excited about starting personal training.    Pertinent History  Patient was diagnosed on 12/01/2018 with right grade II invasive ductal carcinoma breast cancer. She underwent a right lumpectomy and sentinel node biopsy (0/4 nodes positive) on 01/19/2019. Her Oncotype was low so did not need chemo.  Had radiation that was completed 04/21/2019.  She has some peeling and dry sking.  He is taking anti-estrogen therapy.It is ER/PR positive and HER2 negative with a Ki67 of 10%.    Patient Stated Goals  to get help with swelling in her breast    Currently in Pain?  No/denies                        [facility] Adult PT Treatment/Exercise - 07/28/19 0001      Manual Therapy   Manual Therapy  Manual Lymphatic Drainage (MLD);Passive ROM    Manual Lymphatic Drainage (MLD)  In supine: short neck, 5 diaphragmatic breaths, bil shoulder collectors, bil axillary and R inguinal nodes, anterior inter-axillary and R axillo-inguinal anastomosis;  superior and inferior breast toward corresponding anastomosis; re-worked anastomosis, re-worked all surfaces; deep abdominals.     Passive ROM  P/ROM into flexion/abduction and diagonal patterns with end range stretch              PT Education - 07/28/19 1032    Education Details  Pt will continue with exercises, MLD and wearing compression at home. She is going to start personal training sessions. Reviewed MLD with patient throughout MLD this session explaining rational for movement patterns. Discused time frames and what to do if she notices her arm tightening including working on stretching or notifying MD if this is unsuccessful.    Person(s) Educated  Patient    Methods  Explanation    Comprehension  Verbalized understanding          PT Long Term  Goals - 07/28/19 1004      PT LONG TERM GOAL #2   Title  Pt will report the feelings of fullness in her right breast are decreased by 50%    Baseline  pt reports about 98% improvement in R breast fullness.    Status  Achieved      PT LONG TERM GOAL #3   Title  Pt will have a HEP for right shoulder stretching and strengthening  and a plan to continue with progressive strengthening at discharge    Baseline  Pt has an HEP for R shoulder stretching that she performs every other day. She is going to start personal training with [place] once she is done with physical therapy.    Status  Achieved            Plan - 07/28/19 1002    Clinical Impression Statement  Pt was getting measured by [facility] representative for prophylactic sleeve at 10:30. MLD was performed with pt in supine while reviewing massage and explaining rational for sequencing. Pt goals were checked and she has met all of her goals. At this time pt is in contact with personal trainer through [place] and will continue her strength/mobility through this venue. Pt will be discharged at this time.    Personal Factors and Comorbidities  Comorbidity 2    Comorbidities  surgery with 4 nodes removed, radiation    Examination-Activity Limitations  Reach Overhead    Rehab Potential  Good    PT Frequency  3x / week    PT Duration  4 weeks    PT Treatment/Interventions  ADLs/Self Care Home Management;Therapeutic activities;Therapeutic exercise;Orthotic Fit/Training;Patient/family education;Manual techniques;Manual lymph drainage;Passive range of motion;Scar mobilization;Compression bandaging;Taping    PT Next Visit Plan  Pt will be discharged at this time    PT Home Exercise Plan  Self MLD, supine scapular series, and corner pectoarais stretch; bil UE 3 way raises    Consulted and Agree with Plan of Care  Patient       Patient will benefit from skilled therapeutic intervention in order to improve the following deficits and  impairments:  Postural dysfunction, Decreased range of motion, Decreased knowledge of precautions, Impaired UE functional use, Pain, Decreased strength, Decreased scar mobility, Decreased skin integrity, Increased edema, Impaired perceived functional ability, Impaired flexibility, Increased fascial restricitons, Obesity  Visit Diagnosis: Disorder of the skin and subcutaneous tissue related to radiation, unspecified  Stiffness of right shoulder, not elsewhere classified  Muscle weakness (generalized)  Aftercare following surgery for neoplasm  Abnormal posture  Malignant neoplasm of upper-inner quadrant of right breast in female, estrogen receptor positive ([place])  Problem List Patient Active Problem List   Diagnosis Date Noted  . Stress 04/26/2019  . Genetic testing 04/08/2019  . Family history of ovarian cancer   . Family history of brain cancer   . Family history of lung cancer   . Family history of breast cancer   . Malignant neoplasm of upper-inner quadrant of right breast in female, estrogen receptor positive ([place]) 12/13/2018  . Irritable bowel syndrome 11/30/2018  . Hypertriglyceridemia 09/30/2015  . OSA (obstructive sleep apnea) 10/25/2013  . Hearing loss 05/28/2011  . Essential hypertension 09/03/2008  . GOITER 07/14/2007   PHYSICAL THERAPY DISCHARGE SUMMARY  Plan: Patient agrees to discharge.  Patient goals were met. Patient is being discharged due to meeting the stated rehab goals.  ?????      Ander Purpura, PT 07/28/2019, 10:35 AM  [place], [place], [address] Phone: [phone]   Fax:  [phone]  Name: [name] MRN: [id number] Date of Birth: Mar 03, 1966

## 2019-07-29 DIAGNOSIS — C50911 Malignant neoplasm of unspecified site of right female breast: Secondary | ICD-10-CM | POA: Diagnosis not present

## 2019-08-02 ENCOUNTER — Encounter: Payer: 59 | Admitting: Physical Therapy

## 2019-08-04 ENCOUNTER — Ambulatory Visit: Payer: 59 | Admitting: Physical Therapy

## 2019-08-16 ENCOUNTER — Other Ambulatory Visit: Payer: Self-pay

## 2019-08-16 ENCOUNTER — Ambulatory Visit
Admission: RE | Admit: 2019-08-16 | Discharge: 2019-08-16 | Disposition: A | Discharge status: Home / Self Care | Payer: 59 | Source: Ambulatory Visit | Attending: Oncology | Admitting: Oncology

## 2019-08-16 ENCOUNTER — Other Ambulatory Visit: Payer: Self-pay | Admitting: Oncology

## 2019-08-16 DIAGNOSIS — Z170 Estrogen receptor positive status [ER+]: Secondary | ICD-10-CM

## 2019-08-16 DIAGNOSIS — C50211 Malignant neoplasm of upper-inner quadrant of right female breast: Secondary | ICD-10-CM

## 2019-08-17 DIAGNOSIS — G4733 Obstructive sleep apnea (adult) (pediatric): Secondary | ICD-10-CM | POA: Diagnosis not present

## 2019-08-21 ENCOUNTER — Other Ambulatory Visit: Payer: Self-pay | Admitting: *Deleted

## 2019-08-21 ENCOUNTER — Other Ambulatory Visit: Payer: Self-pay | Admitting: Oncology

## 2019-08-21 DIAGNOSIS — Z170 Estrogen receptor positive status [ER+]: Secondary | ICD-10-CM

## 2019-08-21 NOTE — Progress Notes (Signed)
[facility]  [phone]) [phone] [phone]) [phone]     ID: [name] DOB: 03/11/1965  MR#: [id number]  KGU#:[id number]  Patient Care Team: Hali Marry, MD as PCP - General Louretta Shorten, MD (Obstetrics and Gynecology) Mauro Kaufmann, RN as Oncology Nurse Navigator Rockwell Germany, RN as Oncology Nurse Navigator [name], MD as Consulting Physician (General Surgery) Harshini Trent, Virgie Dad, MD as Consulting Physician (Oncology) Kyung Rudd, MD as Consulting Physician (Radiation Oncology) Chauncey Cruel, MD OTHER MD:  CHIEF COMPLAINT: Estrogen receptor positive breast cancer  CURRENT TREATMENT: tamoxifen   INTERVAL HISTORY: [name]" returns today for follow up of her estrogen receptor positive breast cancer.  She underwent genetic counseling on 03/28/2019, and the results were negative.  She completed radiation therapy on 04/21/2019.  She generally did well, with some hyperpigmentation and some fatigue but those were transient and her breast and energy are considerably better  She started tamoxifen on 05/08/2019.  She has had minimal hot flashes, no problems with vaginal wetness.  She has obtained and free of charge.   REVIEW OF SYSTEMS: [name]" exercises chiefly by walking about 30 minutes to 1 hour most days around her neighborhood.  She had some swelling of the breast which she treated with physical therapy and also compression bra's.  She has a compression sleeve as well which she is not using at present.  She has had both her Pfizer vaccine doses and she tolerated them well.  A detailed review of systems today was otherwise stable.   HISTORY OF CURRENT ILLNESS: From the original intake note:  [name] had routine screening mammography on 12/05/2018 showing a possible abnormality in the right breast. She underwent right diagnostic mammography with tomography and right breast ultrasonography at [facility] on  12/08/2018 showing: breast density category C; 1.7 cm irregular mass with associated distortion in the right breast at 1:30; right axilla negative for lymphadenopathy.   Accordingly on 12/09/2018 she proceeded to biopsy of the right breast area in question. The pathology from this procedure ([id number]) showed: invasive ductal carcinoma, grade 2; ductal carcinoma in situ, intermediate grade. Prognostic indicators significant for: estrogen receptor, 90% positive and progesterone receptor, 95% positive, both with strong staining intensity. Proliferation marker Ki67 at 10%. HER2 equivocal by immunohistochemistry (2+), but negative by fluorescent in situ hybridization with a signals ratio 1.32 and number per cell 2.25.  The patient's subsequent history is as detailed below.   PAST MEDICAL HISTORY: Past Medical History:  Diagnosis Date  . Eczema    legs  . Family history of brain cancer   . Family history of breast cancer   . Family history of lung cancer   . Family history of ovarian cancer   . Hallux valgus 06/2012   left  . Hammertoe 06/2012   left second  . Heart murmur    since birth, has had no testing, no problems  . History of frequent ear infections   . Hypertension    under control with med., has been on med. x 3 yr.  . Irritable bowel syndrome (IBS)    no current med.  . Wears hearing aid    bilateral    PAST SURGICAL HISTORY: Past Surgical History:  Procedure Laterality Date  . BREAST LUMPECTOMY WITH RADIOACTIVE SEED AND SENTINEL LYMPH NODE BIOPSY Right 01/19/2019   Procedure: RIGHT BREAST LUMPECTOMY WITH RADIOACTIVE SEED AND SENTINEL LYMPH NODE BIOPSY;  Surgeon: [name], MD;  Location: [facility];  Service: General;  Laterality: Right;  . EXPLORATORY TYMPANOTOMY Left 06/25/2000   with lysis of adhesions  . HAMMER TOE SURGERY Left 06/16/2012   Procedure: LEFT SCARF MODIFIED MCBRIDE AND SECOND Metatarsal WEIL;  Surgeon: Wylene Simmer, MD;  Location: [facility];   Service: Orthopedics;  Laterality: Left;  . TONSILLECTOMY    . TYMPANOPLASTY Left 06/25/2000  . TYMPANOSTOMY TUBE PLACEMENT  10/10/2009    FAMILY HISTORY: Family History  Problem Relation Age of Onset  . Alzheimer's disease Mother   . Brain cancer Father 74       Glioblastoma multiforma.   . Ovarian cancer Paternal Aunt        dx. 81s or 50s  . Lung cancer Other        dx. 9s (maternal great-aunt)  . Breast cancer Other        dx. >50 (maternal fourth degree relative)   Patient's father was 36 years old when he died from glioblastoma, diagnosed age 58.. Patient's mother died at age 78. The patient notes a family hx of breast and ovarian cancer. A paternal aunt was diagnosed with ovarian cancer (age unsure) and a maternal cousin with breast cancer (age unknown).  The patient has 3 brothers, no sisters.    GYNECOLOGIC HISTORY:  No LMP recorded. Menarche: 54 years old Age at first live birth: 54 years old Mamers P 2 LMP 12/14/2018, having regular periods Contraceptive: yes, used for 29 years, stopped 12/2018; currently barrier methods HRT n/a  Hysterectomy? no BSO? no   SOCIAL HISTORY: (updated 12/2018)  [name] "[name]" is currently working as an Investment banker, corporate for [employer]. Husband [name] is a Designer, jewellery. She lives at home with her husband and son. Daughter [name], age 69, is a Programme researcher, broadcasting/film/video at [employer] here in [place]. Son [name], age 70, is a high Public affairs consultant.  The patient attends [employer] in [place].    ADVANCED DIRECTIVES: not in place.  In the absence of any documents to the contrary husband [name] is automatically her HCPOA.   HEALTH MAINTENANCE: Social History   Tobacco Use  . Smoking status: Never Smoker  . Smokeless tobacco: Never Used  Vaping Use  . Vaping Use: Never used  Substance Use Topics  . Alcohol use: Yes    Comment: occasionally  . Drug use: No     Colonoscopy: never done  PAP: 12/01/2018, negative  Bone density: not on  file, due for repeat 01/2019   Allergies  Allergen Reactions  . Erythromycin Nausea And Vomiting    FACIAL NUMBNESS  . Nitrofurantoin Other (See Comments)    GI UPSET  . Lexapro [Escitalopram Oxalate] Nausea Only  . Lisinopril Cough    Current Outpatient Medications  Medication Sig Dispense Refill  . FLUoxetine (PROZAC) 10 MG capsule TAKE 2 CAPSULES BY MOUTH DAILY 180 capsule 1  . losartan-hydrochlorothiazide (HYZAAR) 50-12.5 MG tablet TAKE 1 TABLET BY MOUTH DAILY. 90 tablet 1  . tamoxifen (NOLVADEX) 20 MG tablet Take 1 tablet daily starting 05/08/2019 90 tablet 12   No current facility-administered medications for this visit.    OBJECTIVE:  white woman in no acute distress  Vitals:   08/22/19 1359  BP: (!) 145/88  Pulse: 92  Resp: 18  Temp: 99.1 F (37.3 C)  SpO2: 98%     Body mass index is 31.44 kg/m.   Wt Readings from Last 3 Encounters:  08/22/19 166 lb 6.4 oz (75.5 kg)  04/26/19 161 lb (73 kg)  03/21/19  161 lb 14.4 oz (73.4 kg)      ECOG FS:1 - Symptomatic but completely ambulatory  Sclerae unicteric, EOMs intact Wearing a mask No cervical or supraclavicular adenopathy Lungs no rales or rhonchi Heart regular rate and rhythm Abd soft, nontender, positive bowel sounds MSK no focal spinal tenderness, no upper extremity lymphedema Neuro: nonfocal, well oriented, appropriate affect Breasts: The right breast is status post lumpectomy and radiation.  There is mild hyperpigmentation.  There is some coarsening of the skin as expected.  There is no evidence of residual or recurrent disease.  The cosmetic result is good.  The left breast and both axillae are benign.    LAB RESULTS:  CMP     Component Value Date/Time   NA 140 04/26/2019 1204   K 3.7 04/26/2019 1204   CL 108 04/26/2019 1204   CO2 23 04/26/2019 1204   GLUCOSE 102 04/26/2019 1204   BUN 17 04/26/2019 1204   CREATININE 0.92 04/26/2019 1204   CALCIUM 8.9 04/26/2019 1204   PROT 6.4 04/26/2019 1204    ALBUMIN 3.9 12/21/2018 1220   AST 50 (H) 04/26/2019 1204   AST 28 12/21/2018 1220   ALT 61 (H) 04/26/2019 1204   ALT 23 12/21/2018 1220   ALKPHOS 100 12/21/2018 1220   BILITOT 0.5 04/26/2019 1204   BILITOT 0.5 12/21/2018 1220   GFRNONAA 71 04/26/2019 1204   GFRAA 82 04/26/2019 1204    No results found for: TOTALPROTELP, ALBUMINELP, A1GS, A2GS, BETS, BETA2SER, GAMS, MSPIKE, SPEI  No results found for: KPAFRELGTCHN, LAMBDASER, KAPLAMBRATIO  Lab Results  Component Value Date   WBC 6.7 08/22/2019   NEUTROABS 4.9 08/22/2019   HGB 13.2 08/22/2019   HCT 39.5 08/22/2019   MCV 86.1 08/22/2019   PLT 229 08/22/2019   No results found for: LABCA2  No components found for: RKYHCW237  No results for input(s): INR in the last 168 hours.  No results found for: LABCA2  No results found for: SEG315  No results found for: VVO160  No results found for: VPX106  No results found for: CA2729  No components found for: HGQUANT  No results found for: CEA1 / No results found for: CEA1   No results found for: AFPTUMOR  No results found for: CHROMOGRNA  No results found for: HGBA, HGBA2QUANT, HGBFQUANT, HGBSQUAN (Hemoglobinopathy evaluation)   No results found for: LDH  No results found for: IRON, TIBC, IRONPCTSAT (Iron and TIBC)  No results found for: FERRITIN  Urinalysis No results found for: COLORURINE, APPEARANCEUR, LABSPEC, PHURINE, GLUCOSEU, HGBUR, BILIRUBINUR, KETONESUR, PROTEINUR, UROBILINOGEN, NITRITE, LEUKOCYTESUR   STUDIES: No results found.    ELIGIBLE FOR AVAILABLE RESEARCH PROTOCOL: [place]  ASSESSMENT: 43 y.[name], [place] woman status post right breast upper inner quadrant biopsy 12/09/2018 for a clinical T1c N0, stage IA invasive ductal carcinoma, grade 2, estrogen and progesterone receptor positive, HER-2 nonamplified, with an MIB-1 of 10%  (1) status post right lumpectomy and sentinel lymph node sampling 01/19/2019 for a pT1c pN0, stage Ia  invasive ductal carcinoma, grade 2, with close but negative margins and evidence of lymphovascular invasion  (a) a total of 4 right axillary lymph nodes were removed  (2) Oncotype score of 18 predicts a risk of recurrence outside the breast over the next 9 years of 5% if the patient's only systemic therapy is antiestrogens for 5 years.  It also predicts no benefit from chemotherapy.  (3) adjuvant radiation 03/07/19 - 04/21/19: The patient initially received a dose of 50 Gy  in 11factions to the right breast using whole-breast tangent fields. This was delivered using a 3-D conformal technique. The patient then received a boost to the seroma. This delivered an additional 14.4Gy in 850fctions using a 3-field photon boost. The total dose was 64.4 Gy.  (4) tamoxifen started 05/08/2019  (5) genetics testing 04/08/2019 through the Common Hereditary Cancers Panel offered by [facility] found no deleterious mutations in APC, ATM, AXIN2, BARD1, BMPR1A, BRCA1, BRCA2, BRIP1, CDH1, CDK4, CDKN2A (p14ARF), CDKN2A (p16INK4a), CHEK2, CTNNA1, DICER1, EPCAM (Deletion/duplication testing only), GREM1 (promoter region deletion/duplication testing only), KIT, MEN1, MLH1, MSH2, MSH3, MSH6, MUTYH, NBN, NF1, NHTL1, PALB2, PDGFRA, PMS2, POLD1, POLE, PTEN, RAD50, RAD51C, RAD51D, RNF43, SDHB, SDHC, SDHD, SMAD4, SMARCA4. STK11, TP53, TSC1, TSC2, and VHL.  The following genes were evaluated for sequence changes only: SDHA and HOXB13 c.251G>A variant only.    PLAN: Judy Mercurys tolerating tamoxifen remarkably well, essentially with no significant side effects and the plan will be to continue that a minimum of 5 years.  I encouraged her exercise program which is excellent.  Of course tamoxifen is not a contraceptive.  She may wish to use barrier methods or IUDs in in connection with tamoxifen not only the copper IUD but also Mirena or similar IUDs are acceptable.  She tells me she will be seeing her surgeon in October.  Accordingly she  will return to see me in April of next year.  She knows to call for any other issues that may develop before that visit  Total encounter time 20 minutes.* Chauncey CruelMD   08/22/2019 2:00 PM Medical Oncology and Hematology [id number] [id number]. [phone]  Fax. [phone] This document serves as a record of services personally performed by GuLurline DelMD. It was created on his behalf by KaWilburn Mylara trained medical scribe. The creation of this record is based on the scribe's personal observations and the provider's statements to them.   I, GuLurline DelD, have reviewed the above documentation for accuracy and completeness, and I agree with the above.   *Total Encounter Time as defined by the Centers for Medicare and Medicaid Services includes, in addition to the face-to-face time of a patient visit (documented in the note above) non-face-to-face time: obtaining and reviewing outside history, ordering and reviewing medications, tests or procedures, care coordination (communications with other health care professionals or caregivers) and documentation in the medical record.

## 2019-08-22 ENCOUNTER — Inpatient Hospital Stay: Payer: 59 | Attending: Oncology | Admitting: Oncology

## 2019-08-22 ENCOUNTER — Other Ambulatory Visit: Payer: Self-pay

## 2019-08-22 ENCOUNTER — Inpatient Hospital Stay: Payer: 59

## 2019-08-22 VITALS — BP 145/88 | HR 92 | Temp 99.1°F | Resp 18 | Ht 61.0 in | Wt 166.4 lb

## 2019-08-22 DIAGNOSIS — R011 Cardiac murmur, unspecified: Secondary | ICD-10-CM | POA: Diagnosis not present

## 2019-08-22 DIAGNOSIS — Z170 Estrogen receptor positive status [ER+]: Secondary | ICD-10-CM | POA: Insufficient documentation

## 2019-08-22 DIAGNOSIS — Z79899 Other long term (current) drug therapy: Secondary | ICD-10-CM | POA: Insufficient documentation

## 2019-08-22 DIAGNOSIS — K589 Irritable bowel syndrome without diarrhea: Secondary | ICD-10-CM | POA: Insufficient documentation

## 2019-08-22 DIAGNOSIS — Z79810 Long term (current) use of selective estrogen receptor modulators (SERMs): Secondary | ICD-10-CM | POA: Diagnosis not present

## 2019-08-22 DIAGNOSIS — I10 Essential (primary) hypertension: Secondary | ICD-10-CM | POA: Diagnosis not present

## 2019-08-22 DIAGNOSIS — C50211 Malignant neoplasm of upper-inner quadrant of right female breast: Secondary | ICD-10-CM | POA: Diagnosis not present

## 2019-08-22 DIAGNOSIS — Z923 Personal history of irradiation: Secondary | ICD-10-CM | POA: Insufficient documentation

## 2019-08-22 LAB — CBC WITH DIFFERENTIAL/PLATELET
Abs Immature Granulocytes: 0.01 10*3/uL (ref 0.00–0.07)
Basophils Absolute: 0 10*3/uL (ref 0.0–0.1)
Basophils Relative: 0 %
Eosinophils Absolute: 0.2 10*3/uL (ref 0.0–0.5)
Eosinophils Relative: 3 %
HCT: 39.5 % (ref 36.0–46.0)
Hemoglobin: 13.2 g/dL (ref 12.0–15.0)
Immature Granulocytes: 0 %
Lymphocytes Relative: 16 %
Lymphs Abs: 1.1 10*3/uL (ref 0.7–4.0)
MCH: 28.8 pg (ref 26.0–34.0)
MCHC: 33.4 g/dL (ref 30.0–36.0)
MCV: 86.1 fL (ref 80.0–100.0)
Monocytes Absolute: 0.6 10*3/uL (ref 0.1–1.0)
Monocytes Relative: 8 %
Neutro Abs: 4.9 10*3/uL (ref 1.7–7.7)
Neutrophils Relative %: 73 %
Platelets: 229 10*3/uL (ref 150–400)
RBC: 4.59 MIL/uL (ref 3.87–5.11)
RDW: 13.3 % (ref 11.5–15.5)
WBC: 6.7 10*3/uL (ref 4.0–10.5)
nRBC: 0 % (ref 0.0–0.2)

## 2019-08-22 LAB — COMPREHENSIVE METABOLIC PANEL
ALT: 22 U/L (ref 0–44)
AST: 22 U/L (ref 15–41)
Albumin: 3.4 g/dL — ABNORMAL LOW (ref 3.5–5.0)
Alkaline Phosphatase: 84 U/L (ref 38–126)
Anion gap: 10 (ref 5–15)
BUN: 15 mg/dL (ref 6–20)
CO2: 22 mmol/L (ref 22–32)
Calcium: 8.6 mg/dL — ABNORMAL LOW (ref 8.9–10.3)
Chloride: 109 mmol/L (ref 98–111)
Creatinine, Ser: 0.92 mg/dL (ref 0.44–1.00)
GFR calc Af Amer: >60 mL/min (ref >60)
GFR calc non Af Amer: >60 mL/min (ref >60)
Glucose, Bld: 111 mg/dL — ABNORMAL HIGH (ref 70–99)
Potassium: 3.7 mmol/L (ref 3.5–5.1)
Sodium: 141 mmol/L (ref 135–145)
Total Bilirubin: 0.5 mg/dL (ref 0.3–1.2)
Total Protein: 6.4 g/dL — ABNORMAL LOW (ref 6.5–8.1)

## 2019-08-23 ENCOUNTER — Telehealth: Payer: Self-pay | Admitting: Oncology

## 2019-08-23 NOTE — Telephone Encounter (Signed)
Scheduled appts per 6/15 los. Pt confirmed appt date and time.

## 2019-09-21 ENCOUNTER — Encounter: Payer: Self-pay | Admitting: Family Medicine

## 2019-10-03 ENCOUNTER — Ambulatory Visit
Admission: RE | Admit: 2019-10-03 | Discharge: 2019-10-03 | Disposition: A | Discharge status: Home / Self Care | Payer: 59 | Source: Ambulatory Visit | Attending: Oncology | Admitting: Oncology

## 2019-10-03 ENCOUNTER — Other Ambulatory Visit: Payer: Self-pay

## 2019-10-03 DIAGNOSIS — Z170 Estrogen receptor positive status [ER+]: Secondary | ICD-10-CM

## 2019-10-03 DIAGNOSIS — C50211 Malignant neoplasm of upper-inner quadrant of right female breast: Secondary | ICD-10-CM

## 2019-10-03 DIAGNOSIS — R922 Inconclusive mammogram: Secondary | ICD-10-CM | POA: Diagnosis not present

## 2019-10-03 HISTORY — DX: Personal history of irradiation: Z92.3

## 2019-10-25 MED FILL — LOSARTAN-HCTZ 50-12.5 MG TA: 50-12.5 | 90 days supply | Qty: 90 | Fill #1

## 2019-10-25 MED FILL — TAMOXIFEN CITRATE 20 MG TAB: 20 | 90 days supply | Qty: 90 | Fill #2

## 2019-11-09 ENCOUNTER — Emergency Department (INDEPENDENT_AMBULATORY_CARE_PROVIDER_SITE_OTHER): Payer: 59

## 2019-11-09 ENCOUNTER — Other Ambulatory Visit: Payer: Self-pay

## 2019-11-09 ENCOUNTER — Telehealth: Payer: 59 | Admitting: Physician Assistant

## 2019-11-09 ENCOUNTER — Emergency Department
Admission: RE | Admit: 2019-11-09 | Discharge: 2019-11-09 | Disposition: A | Discharge status: Home / Self Care | Payer: 59 | Source: Ambulatory Visit

## 2019-11-09 VITALS — BP 126/79 | HR 107 | Temp 98.0°F | Resp 18

## 2019-11-09 DIAGNOSIS — J069 Acute upper respiratory infection, unspecified: Secondary | ICD-10-CM | POA: Diagnosis not present

## 2019-11-09 DIAGNOSIS — R0982 Postnasal drip: Secondary | ICD-10-CM | POA: Diagnosis not present

## 2019-11-09 DIAGNOSIS — R05 Cough: Secondary | ICD-10-CM

## 2019-11-09 DIAGNOSIS — R0789 Other chest pain: Secondary | ICD-10-CM | POA: Diagnosis not present

## 2019-11-09 DIAGNOSIS — Z20822 Contact with and (suspected) exposure to covid-19: Secondary | ICD-10-CM | POA: Diagnosis not present

## 2019-11-09 DIAGNOSIS — R058 Other specified cough: Secondary | ICD-10-CM

## 2019-11-09 DIAGNOSIS — R079 Chest pain, unspecified: Secondary | ICD-10-CM | POA: Diagnosis not present

## 2019-11-09 MED ORDER — PROMETHAZINE-DM 6.25-15 MG/5ML PO SYRP
5.0000 mL | ORAL_SOLUTION | Freq: Four times a day (QID) | ORAL | 0 refills | Status: DC | PRN
Start: 2019-11-09 — End: 2020-02-19

## 2019-11-09 MED ORDER — AMOXICILLIN-POT CLAVULANATE 875-125 MG PO TABS
1.0000 | ORAL_TABLET | Freq: Two times a day (BID) | ORAL | 0 refills | Status: DC
Start: 2019-11-09 — End: 2020-02-19

## 2019-11-09 MED ORDER — AEROCHAMBER PLUS FLO-VU MEDIUM MISC
1.0000 | Freq: Once | 0 refills | Status: AC
Start: 2019-11-09 — End: 2019-11-09

## 2019-11-09 MED ORDER — ALBUTEROL SULFATE HFA 108 (90 BASE) MCG/ACT IN AERS
2.0000 | INHALATION_SPRAY | RESPIRATORY_TRACT | 0 refills | Status: DC | PRN
Start: 2019-11-09 — End: 2020-02-19

## 2019-11-09 MED ORDER — IPRATROPIUM BROMIDE 0.06 % NA SOLN
2.0000 | Freq: Four times a day (QID) | NASAL | 1 refills | Status: DC
Start: 2019-11-09 — End: 2020-02-19

## 2019-11-09 MED FILL — ALBUTEROL SULFATE HFA 108 (: 108 (90 BAS | 8 days supply | Qty: 18 | Fill #0

## 2019-11-09 MED FILL — PROMETHAZINE W/DM SYRUP: 6.25-15 | 6 days supply | Qty: 118 | Fill #0

## 2019-11-09 MED FILL — AMOX-CLAV 875-125 MG TABLET: 875-125 | 7 days supply | Qty: 14 | Fill #0

## 2019-11-09 MED FILL — MICROCHAMBER: 1 days supply | Qty: 1 | Fill #0

## 2019-11-09 NOTE — Progress Notes (Signed)
E-Visit for [employer] Virus Screening  Your current symptoms could be consistent with the coronavirus, even though you are fully vaccinated.  I think you should be tested.  Given 5 days of symptoms and think cream colored mucous, I am also concerned about a possible secondary bacterial infection.  I am going to write for cough medication and an antibiotic.  You should be tested for COVID.  If you become short of breath, weak or dizzy, please present to an ER or Urgent Care immediately for further evaluation   Many health care providers can now test patients at their office but not all are.  [facility] has multiple testing sites. For information on our [place] testing locations and hours go to [email or web address]  We are enrolling you in our [facility] for [place] . Daily you will receive a questionnaire within the [place] website. Our COVID 19 response team will be monitoring your responses daily.  Testing Information: The COVID-19 Community Testing sites will begin testing BY APPOINTMENT ONLY.  You can schedule online at [email or web address]  If you do not have access to a smart phone or computer you may call [phone] for an appointment.   Additional testing sites in the Community:  . For [facility] Testing sites in [facility]  FaceUpdate.uy  . For Pop-up testing sites in [place]  BowlDirectory.co.uk  . For Testing sites with regular hours [email or web address]  . For [place] [email or web address]  . For [facility] [email or web address]  . For [facility] testing in [place] and [employer]  BasicJet.[email or web address]  . For [facility] testing in [facility]   [email or web address]  For  more information about community testing call [phone]   Please quarantine yourself while awaiting your test results. Please stay home for a minimum of 10 days from the first day of illness with improving symptoms and you have had 24 hours of no fever (without the use of Tylenol (Acetaminophen) Motrin (Ibuprofen) or any fever reducing medication).  Also - Do not get tested prior to returning to work because once you have had a positive test the test can stay positive for more than a month in some cases.   You should wear a mask or cloth face covering over your nose and mouth if you must be around other people or animals, including pets (even at home). Try to stay at least 6 feet away from other people. This will protect the people around you.  Please continue good preventive care measures, including:  frequent hand-washing, avoid touching your face, cover coughs/sneezes, stay out of crowds and keep a 6 foot distance from others.  COVID-19 is a respiratory illness with symptoms that are similar to the flu. Symptoms are typically mild to moderate, but there have been cases of severe illness and death due to the virus.   The following symptoms may appear 2-14 days after exposure: . Fever . Cough . Shortness of breath or difficulty breathing . Chills . Repeated shaking with chills . Muscle pain . Headache . Sore throat . New loss of taste or smell . Fatigue . Congestion or runny nose . Nausea or vomiting . Diarrhea  Go to the nearest hospital ED for assessment if fever/cough/breathlessness are severe or illness seems like a threat to life.  It is vitally important that if you feel that you have an infection such as this virus or any other virus that you stay home and away from places where you  may spread it to  others.  You should avoid contact with people age 40 and older.   You can use medication such as A prescription inhaler called Albuterol MDI 90 mcg /actuation 2 puffs every 4 hours as needed for shortness of breath, wheezing, cough and A prescription cough medication called Phenergan DM 6.25 mg/15 mg. You make take one teaspoon / 5 ml every 4-6 hours as needed for cough  You may also take acetaminophen (Tylenol) as needed for fever.  Reduce your risk of any infection by using the same precautions used for avoiding the common cold or flu:  Marland Kitchen Wash your hands often with soap and warm water for at least 20 seconds.  If soap and water are not readily available, use an alcohol-based hand sanitizer with at least 60% alcohol.  . If coughing or sneezing, cover your mouth and nose by coughing or sneezing into the elbow areas of your shirt or coat, into a tissue or into your sleeve (not your hands). . Avoid shaking hands with others and consider head nods or verbal greetings only. . Avoid touching your eyes, nose, or mouth with unwashed hands.  . Avoid close contact with people who are sick. . Avoid places or events with large numbers of people in one location, like concerts or sporting events. . Carefully consider travel plans you have or are making. . If you are planning any travel outside or inside the [place], visit the CDC's Travelers' Health webpage for the latest health notices. . If you have some symptoms but not all symptoms, continue to monitor at home and seek medical attention if your symptoms worsen. . If you are having a medical emergency, call 911.  HOME CARE . Only take medications as instructed by your medical team. . Drink plenty of fluids and get plenty of rest. . A steam or ultrasonic humidifier can help if you have congestion.   GET HELP RIGHT AWAY IF YOU HAVE EMERGENCY WARNING SIGNS** FOR COVID-19. If you or someone is showing any of these signs seek emergency medical  care immediately. Call 911 or proceed to your closest emergency facility if: . You develop worsening high fever. . Trouble breathing . Bluish lips or face . Persistent pain or pressure in the chest . New confusion . Inability to wake or stay awake . You cough up blood. . Your symptoms become more severe  **This list is not all possible symptoms. Contact your medical provider for any symptoms that are sever or concerning to you.  MAKE SURE YOU   Understand these instructions.  Will watch your condition.  Will get help right away if you are not doing well or get worse.  Your e-visit answers were reviewed by a board certified advanced clinical practitioner to complete your personal care plan.  Depending on the condition, your plan could have included both over the counter or prescription medications.  If there is a problem please reply once you have received a response from your provider.  Your safety is important to [place].  If you have drug allergies check your prescription carefully.    You can use MyChart to ask questions about today's visit, request a non-urgent call back, or ask for a work or school excuse for 24 hours related to this e-Visit. If it has been greater than 24 hours you will need to follow up with your provider, or enter a new e-Visit to address those concerns. You will get an e-mail in the next two days asking about your experience.  I hope that your e-visit has been valuable and will speed your recovery. Thank you for using e-visits.   Greater than 5 minutes, yet less than 10 minutes of time have been spent researching, coordinating, and implementing care for this patient today

## 2019-11-09 NOTE — ED Triage Notes (Signed)
Patient presents to Urgent Care with complaints of productive cough, generalized body aches and nasal congestion since 5-6 days ago. Patient reports she has been vaccinated for covid, has not been tested for covid recently.

## 2019-11-09 NOTE — Discharge Instructions (Signed)
  You may take 500mg  acetaminophen every 4-6 hours or in combination with ibuprofen 400-600mg  every 6-8 hours as needed for pain, inflammation, and fever.  Be sure to well hydrated with clear liquids and get at least 8 hours of sleep at night, preferably more while sick.   Please follow up with family medicine next week if not improving.   You may take the previously prescribed cough medication- promethazine-dextromethorphan from earlier today to help with cough. This medication can cause drowsiness. Be sure to only take as prescribed.

## 2019-11-09 NOTE — ED Provider Notes (Signed)
[name]    CSN: [id number] Arrival date & time: 11/09/19  1807      History   Chief Complaint Chief Complaint  Patient presents with  . Appointment    6:00  . Cough    HPI [name] is a 54 y.o. female.   HPI [name] is a 54 y.o. female presenting to UC with c/o productive cough, generalized body aches and nasal congestion for about 5-6 days.  She has been vaccinated for COVID but did an Evisit this morning and was encouraged to be tested for COVID. Pt's husband also in UC with similar symptoms. Exposure to granddaughter recently who tested positive for RSV. Pt's husband was tested last week for COVID, it was negative. Pt denies fever, chills, n/v/d. No chest pain or OSB. No hx of asthma but does have OSA.   Past Medical History:  Diagnosis Date  . Eczema    legs  . Family history of brain cancer   . Family history of breast cancer   . Family history of lung cancer   . Family history of ovarian cancer   . Hallux valgus 06/2012   left  . Hammertoe 06/2012   left second  . Heart murmur    since birth, has had no testing, no problems  . History of frequent ear infections   . Hypertension    under control with med., has been on med. x 3 yr.  . Irritable bowel syndrome (IBS)    no current med.  . Personal history of radiation therapy   . Wears hearing aid    bilateral    Patient Active Problem List   Diagnosis Date Noted  . Stress 04/26/2019  . Genetic testing 04/08/2019  . Family history of ovarian cancer   . Family history of brain cancer   . Family history of lung cancer   . Family history of breast cancer   . Malignant neoplasm of upper-inner quadrant of right breast in female, estrogen receptor positive ([place]) 12/13/2018  . Irritable bowel syndrome 11/30/2018  . Hypertriglyceridemia 09/30/2015  . OSA (obstructive sleep apnea) 10/25/2013  . Hearing loss 05/28/2011  . Essential hypertension 09/03/2008  . GOITER  07/14/2007    Past Surgical History:  Procedure Laterality Date  . BREAST BIOPSY Right 12/22/2018  . BREAST LUMPECTOMY Right 01/19/2019  . BREAST LUMPECTOMY WITH RADIOACTIVE SEED AND SENTINEL LYMPH NODE BIOPSY Right 01/19/2019   Procedure: RIGHT BREAST LUMPECTOMY WITH RADIOACTIVE SEED AND SENTINEL LYMPH NODE BIOPSY;  Surgeon: [name], MD;  Location: [facility];  Service: General;  Laterality: Right;  . EXPLORATORY TYMPANOTOMY Left 06/25/2000   with lysis of adhesions  . HAMMER TOE SURGERY Left 06/16/2012   Procedure: LEFT SCARF MODIFIED MCBRIDE AND SECOND Metatarsal WEIL;  Surgeon: Wylene Simmer, MD;  Location: [facility];  Service: Orthopedics;  Laterality: Left;  . TONSILLECTOMY    . TYMPANOPLASTY Left 06/25/2000  . TYMPANOSTOMY TUBE PLACEMENT  10/10/2009    OB History   No obstetric history on file.      Home Medications    Prior to Admission medications   Medication Sig Start Date End Date Taking? Authorizing Provider  albuterol (VENTOLIN HFA) 108 (90 Base) MCG/ACT inhaler Inhale 2 puffs into the lungs every 2 (two) hours as needed for wheezing or shortness of breath (cough). 11/09/19   Muthersbaugh, Jarrett Soho, PA-C  amoxicillin-clavulanate (AUGMENTIN) 875-125 MG tablet Take 1 tablet by mouth 2 (two) times daily. One po bid  x 7 days 11/09/19   Muthersbaugh, Jarrett Soho, PA-C  FLUoxetine (PROZAC) 10 MG capsule TAKE 2 CAPSULES BY MOUTH DAILY 07/25/19   Hali Marry, MD  ipratropium (ATROVENT) 0.06 % nasal spray Place 2 sprays into both nostrils 4 (four) times daily. 11/09/19   Noe Gens, PA-C  losartan-hydrochlorothiazide (HYZAAR) 50-12.5 MG tablet TAKE 1 TABLET BY MOUTH DAILY. 07/25/19   Hali Marry, MD  promethazine-dextromethorphan (PROMETHAZINE-DM) 6.25-15 MG/5ML syrup Take 5 mLs by mouth 4 (four) times daily as needed for cough. 11/09/19   Muthersbaugh, Jarrett Soho, PA-C  tamoxifen (NOLVADEX) 20 MG tablet Take 1 tablet daily starting 05/08/2019 03/21/19   Magrinat,  Virgie Dad, MD    Family History Family History  Problem Relation Age of Onset  . Alzheimer's disease Mother   . Brain cancer Father 35       Glioblastoma multiforma.   . Ovarian cancer Paternal Aunt        dx. 57s or 50s  . Lung cancer Other        dx. 79s (maternal great-aunt)    Social History Social History   Tobacco Use  . Smoking status: Never Smoker  . Smokeless tobacco: Never Used  Vaping Use  . Vaping Use: Never used  Substance Use Topics  . Alcohol use: Not Currently    Comment: occasionally  . Drug use: No     Allergies   Erythromycin, Nitrofurantoin, Lexapro [escitalopram oxalate], and Lisinopril   Review of Systems Review of Systems  Constitutional: Positive for fatigue. Negative for chills and fever.  HENT: Positive for congestion. Negative for ear pain, sore throat, trouble swallowing and voice change.   Respiratory: Positive for cough. Negative for shortness of breath.   Cardiovascular: Negative for chest pain and palpitations.  Gastrointestinal: Negative for abdominal pain, diarrhea, nausea and vomiting.  Musculoskeletal: Positive for arthralgias, back pain and myalgias.  Skin: Negative for rash.  All other systems reviewed and are negative.    Physical Exam Triage Vital Signs ED Triage Vitals  Enc Vitals Group     BP 11/09/19 1830 126/79     Pulse Rate 11/09/19 1830 (!) 107     Resp 11/09/19 1830 18     Temp 11/09/19 1830 98 F (36.7 C)     Temp Source 11/09/19 1830 Oral     SpO2 11/09/19 1830 97 %     Weight --      Height --      Head Circumference --      Peak Flow --      Pain Score 11/09/19 1827 0     Pain Loc --      Pain Edu? --      Excl. in [place]? --    No data found.  Updated Vital Signs BP 126/79 (BP Location: Left Arm)   Pulse (!) 107   Temp 98 F (36.7 C) (Oral)   Resp 18   SpO2 97%   Visual Acuity Right Eye Distance:   Left Eye Distance:   Bilateral Distance:    Right Eye Near:   Left Eye Near:    Bilateral  Near:     Physical Exam Vitals and nursing note reviewed.  Constitutional:      General: She is not in acute distress.    Appearance: Normal appearance. She is well-developed. She is not ill-appearing, toxic-appearing or diaphoretic.  HENT:     Head: Normocephalic and atraumatic.     Right Ear: Tympanic membrane and ear canal  normal.     Left Ear: Tympanic membrane and ear canal normal.     Nose: Nose normal.     Right Sinus: No maxillary sinus tenderness or frontal sinus tenderness.     Left Sinus: No maxillary sinus tenderness or frontal sinus tenderness.     Mouth/Throat:     Lips: Pink.     Mouth: Mucous membranes are moist.     Pharynx: Oropharynx is clear. Uvula midline. Posterior oropharyngeal erythema ( post-nasal drainage noted) present. No pharyngeal swelling, oropharyngeal exudate or uvula swelling.     Tonsils: No tonsillar exudate or tonsillar abscesses.  Cardiovascular:     Rate and Rhythm: Normal rate and regular rhythm.  Pulmonary:     Effort: Pulmonary effort is normal. No respiratory distress.     Breath sounds: Normal breath sounds. No stridor. No wheezing, rhonchi or rales.  Musculoskeletal:        General: Normal range of motion.     Cervical back: Normal range of motion.  Skin:    General: Skin is warm and dry.  Neurological:     Mental Status: She is alert and oriented to person, place, and time.  Psychiatric:        Behavior: Behavior normal.      UC Treatments / Results  Labs (all labs ordered are listed, but only abnormal results are displayed) Labs Reviewed  SARS-COV-2 RNA,(COVID-19) QUALITATIVE NAAT    EKG   Radiology DG Chest 2 View  Result Date: 11/09/2019 CLINICAL DATA:  54 year-old female c/o productive cough, muscle aches, chest soreness x 1 week. Pt has not had a COVID test. EXAM: CHEST - 2 VIEW COMPARISON:  None. FINDINGS: Cardiac silhouette is normal in size. Normal mediastinal and hilar contours. Clear lungs.  No pleural effusion  or pneumothorax. Surgical vascular clips noted along the medial right breast. Skeletal structures are unremarkable. IMPRESSION: No active cardiopulmonary disease. Electronically Signed   By: Lajean Manes M.D.   On: 11/09/2019 19:06    Procedures Procedures (including critical care time)  Medications Ordered in UC Medications - No data to display  Initial Impression / Assessment and Plan / UC Course  I have reviewed the triage vital signs and the nursing notes.  Pertinent labs & imaging results that were available during my care of the patient were reviewed by me and considered in my medical decision making (see chart for details).     Normal exam, no evidence of bacterial infection at this time Discussed imaging with pt Encouraged symptomatic tx F/u with PCP next week if not improving COVID test pending. AVS given  Final Clinical Impressions(s) / UC Diagnoses   Final diagnoses:  Viral URI with cough  Post-nasal drip     Discharge Instructions      You may take 500mg  acetaminophen every 4-6 hours or in combination with ibuprofen 400-600mg  every 6-8 hours as needed for pain, inflammation, and fever.  Be sure to well hydrated with clear liquids and get at least 8 hours of sleep at night, preferably more while sick.   Please follow up with family medicine next week if not improving.   You may take the previously prescribed cough medication- promethazine-dextromethorphan from earlier today to help with cough. This medication can cause drowsiness. Be sure to only take as prescribed.     ED Prescriptions    Medication Sig Dispense Auth. Provider   ipratropium (ATROVENT) 0.06 % nasal spray Place 2 sprays into both nostrils 4 (four) times daily.  15 mL Noe Gens, PA-C     PDMP not reviewed this encounter.   Noe Gens, [place] 11/11/19 [phone]

## 2019-11-11 LAB — SARS-COV-2 RNA,(COVID-19) QUALITATIVE NAAT: SARS CoV2 RNA: NOT DETECTED

## 2019-11-17 DIAGNOSIS — G4733 Obstructive sleep apnea (adult) (pediatric): Secondary | ICD-10-CM | POA: Diagnosis not present

## 2020-01-12 DIAGNOSIS — C50211 Malignant neoplasm of upper-inner quadrant of right female breast: Secondary | ICD-10-CM | POA: Diagnosis not present

## 2020-01-12 DIAGNOSIS — N6489 Other specified disorders of breast: Secondary | ICD-10-CM | POA: Diagnosis not present

## 2020-01-12 DIAGNOSIS — Z170 Estrogen receptor positive status [ER+]: Secondary | ICD-10-CM | POA: Diagnosis not present

## 2020-01-25 ENCOUNTER — Other Ambulatory Visit: Payer: Self-pay | Admitting: Family Medicine

## 2020-01-25 DIAGNOSIS — I10 Essential (primary) hypertension: Secondary | ICD-10-CM

## 2020-01-25 MED FILL — TAMOXIFEN CITRATE 20 MG TAB: 20 | 90 days supply | Qty: 90 | Fill #3

## 2020-01-26 ENCOUNTER — Other Ambulatory Visit: Payer: Self-pay | Admitting: Family Medicine

## 2020-01-26 MED FILL — LOSARTAN-HCTZ 50-12.5 MG TA: 50-12.5 | 90 days supply | Qty: 90 | Fill #0

## 2020-01-26 NOTE — Telephone Encounter (Signed)
Called pt to inform her of being overdue for f/u. I transferred her up front to schedule this appointment.

## 2020-01-29 DIAGNOSIS — Z6834 Body mass index (BMI) 34.0-34.9, adult: Secondary | ICD-10-CM | POA: Diagnosis not present

## 2020-01-29 DIAGNOSIS — Z01419 Encounter for gynecological examination (general) (routine) without abnormal findings: Secondary | ICD-10-CM | POA: Diagnosis not present

## 2020-01-29 DIAGNOSIS — N959 Unspecified menopausal and perimenopausal disorder: Secondary | ICD-10-CM | POA: Diagnosis not present

## 2020-02-06 DIAGNOSIS — Z923 Personal history of irradiation: Secondary | ICD-10-CM | POA: Insufficient documentation

## 2020-02-07 DIAGNOSIS — Z923 Personal history of irradiation: Secondary | ICD-10-CM | POA: Diagnosis not present

## 2020-02-07 DIAGNOSIS — Z853 Personal history of malignant neoplasm of breast: Secondary | ICD-10-CM | POA: Diagnosis not present

## 2020-02-16 DIAGNOSIS — G4733 Obstructive sleep apnea (adult) (pediatric): Secondary | ICD-10-CM | POA: Diagnosis not present

## 2020-02-19 ENCOUNTER — Encounter: Payer: Self-pay | Admitting: Family Medicine

## 2020-02-19 ENCOUNTER — Ambulatory Visit (INDEPENDENT_AMBULATORY_CARE_PROVIDER_SITE_OTHER): Payer: 59 | Admitting: Family Medicine

## 2020-02-19 VITALS — BP 116/69 | HR 83 | Ht 61.0 in | Wt 173.0 lb

## 2020-02-19 DIAGNOSIS — E781 Pure hyperglyceridemia: Secondary | ICD-10-CM | POA: Diagnosis not present

## 2020-02-19 DIAGNOSIS — I10 Essential (primary) hypertension: Secondary | ICD-10-CM

## 2020-02-19 NOTE — Progress Notes (Signed)
Established Patient Office Visit  Subjective:  Patient ID: [name], female    DOB: April 08, 1965  Age: 54 y.o. MRN: [id number]  CC:  Chief Complaint  Patient presents with  . Hypertension    HPI [name] presents for   Hypertension- Pt denies chest pain, SOB, dizziness, or heart palpitations.  Taking meds as directed w/o problems.  Denies medication side effects.  Reports that she does stick to a low-salt diet.  Says she did feels like she does need to drink more water.  She is doing well overall.  She has had some constriction atrophy of the right breast status post radiation and so is actually planning on having surgery in January 2 reduce the breast tissue on the left side so that it is more symmetric.  He did wean off the Prozac back in July and has done well with that.   Past Medical History:  Diagnosis Date  . Eczema    legs  . Family history of brain cancer   . Family history of breast cancer   . Family history of lung cancer   . Family history of ovarian cancer   . Hallux valgus 06/2012   left  . Hammertoe 06/2012   left second  . Heart murmur    since birth, has had no testing, no problems  . History of frequent ear infections   . Hypertension    under control with med., has been on med. x 3 yr.  . Irritable bowel syndrome (IBS)    no current med.  . Personal history of radiation therapy   . Wears hearing aid    bilateral    Past Surgical History:  Procedure Laterality Date  . BREAST BIOPSY Right 12/22/2018  . BREAST LUMPECTOMY Right 01/19/2019  . BREAST LUMPECTOMY WITH RADIOACTIVE SEED AND SENTINEL LYMPH NODE BIOPSY Right 01/19/2019   Procedure: RIGHT BREAST LUMPECTOMY WITH RADIOACTIVE SEED AND SENTINEL LYMPH NODE BIOPSY;  Surgeon: [name], MD;  Location: [facility];  Service: General;  Laterality: Right;  . EXPLORATORY TYMPANOTOMY Left 06/25/2000   with lysis of adhesions  . HAMMER TOE SURGERY Left 06/16/2012   Procedure:  LEFT SCARF MODIFIED MCBRIDE AND SECOND Metatarsal WEIL;  Surgeon: Wylene Simmer, MD;  Location: [facility];  Service: Orthopedics;  Laterality: Left;  . TONSILLECTOMY    . TYMPANOPLASTY Left 06/25/2000  . TYMPANOSTOMY TUBE PLACEMENT  10/10/2009    Family History  Problem Relation Age of Onset  . Alzheimer's disease Mother   . Brain cancer Father 72       Glioblastoma multiforma.   . Ovarian cancer Paternal Aunt        dx. 57s or 50s  . Lung cancer Other        dx. 91s (maternal great-aunt)    Social History   Socioeconomic History  . Marital status: Married    Spouse name: Not on file  . Number of children: 2   . Years of education: Not on file  . Highest education level: Not on file  Occupational History  . Occupation: works from home   Tobacco Use  . Smoking status: Never Smoker  . Smokeless tobacco: Never Used  Vaping Use  . Vaping Use: Never used  Substance and Sexual Activity  . Alcohol use: Not Currently    Comment: occasionally  . Drug use: No  . Sexual activity: Not on file  Other Topics Concern  . Not on file  Social History  Narrative   No regular exercise.     Social Determinants of Health   Financial Resource Strain: Not on file  Food Insecurity: Not on file  Transportation Needs: Not on file  Physical Activity: Not on file  Stress: Not on file  Social Connections: Not on file  Intimate Partner Violence: Not on file    Outpatient Medications Prior to Visit  Medication Sig Dispense Refill  . losartan-hydrochlorothiazide (HYZAAR) 50-12.5 MG tablet TAKE 1 TABLET BY MOUTH DAILY. 90 tablet 1  . tamoxifen (NOLVADEX) 20 MG tablet Take 1 tablet daily starting 05/08/2019 90 tablet 12  . albuterol (VENTOLIN HFA) 108 (90 Base) MCG/ACT inhaler Inhale 2 puffs into the lungs every 2 (two) hours as needed for wheezing or shortness of breath (cough). 8 g 0  . amoxicillin-clavulanate (AUGMENTIN) 875-125 MG tablet Take 1 tablet by mouth 2 (two) times  daily. One po bid x 7 days 14 tablet 0  . FLUoxetine (PROZAC) 10 MG capsule TAKE 2 CAPSULES BY MOUTH DAILY 180 capsule 1  . ipratropium (ATROVENT) 0.06 % nasal spray Place 2 sprays into both nostrils 4 (four) times daily. 15 mL 1  . promethazine-dextromethorphan (PROMETHAZINE-DM) 6.25-15 MG/5ML syrup Take 5 mLs by mouth 4 (four) times daily as needed for cough. 118 mL 0   No facility-administered medications prior to visit.    Allergies  Allergen Reactions  . Erythromycin Nausea And Vomiting    FACIAL NUMBNESS  . Nitrofurantoin Other (See Comments)    GI UPSET  . Lexapro [Escitalopram Oxalate] Nausea Only  . Lisinopril Cough    ROS Review of Systems    Objective:    Physical Exam Constitutional:      Appearance: She is well-developed and well-nourished.  HENT:     Head: Normocephalic and atraumatic.  Cardiovascular:     Rate and Rhythm: Normal rate and regular rhythm.     Heart sounds: Normal heart sounds.  Pulmonary:     Effort: Pulmonary effort is normal.     Breath sounds: Normal breath sounds.  Skin:    General: Skin is warm and dry.  Neurological:     Mental Status: She is alert and oriented to person, place, and time.  Psychiatric:        Mood and Affect: Mood and affect normal.        Behavior: Behavior normal.     BP 116/69   Pulse 83   Ht 5\' 1"  (1.549 m)   Wt 173 lb (78.5 kg)   SpO2 98%   BMI 32.69 kg/m  Wt Readings from Last 3 Encounters:  02/19/20 173 lb (78.5 kg)  08/22/19 166 lb 6.4 oz (75.5 kg)  04/26/19 161 lb (73 kg)     Health Maintenance Due  Topic Date Due  . Hepatitis C Screening  Never done  . COVID-19 Vaccine (3 - Pfizer risk 4-dose series) 07/12/2019    There are no preventive care reminders to display for this patient.  Lab Results  Component Value Date   TSH 2.238 07/27/2007   Lab Results  Component Value Date   WBC 6.7 08/22/2019   HGB 13.2 08/22/2019   HCT 39.5 08/22/2019   MCV 86.1 08/22/2019   PLT 229 08/22/2019    Lab Results  Component Value Date   NA 141 08/22/2019   K 3.7 08/22/2019   CO2 22 08/22/2019   GLUCOSE 111 (H) 08/22/2019   BUN 15 08/22/2019   CREATININE 0.92 08/22/2019   BILITOT 0.5 08/22/2019  ALKPHOS 84 08/22/2019   AST 22 08/22/2019   ALT 22 08/22/2019   PROT 6.4 (L) 08/22/2019   ALBUMIN 3.4 (L) 08/22/2019   CALCIUM 8.6 (L) 08/22/2019   ANIONGAP 10 08/22/2019   Lab Results  Component Value Date   CHOL 148 04/26/2019   Lab Results  Component Value Date   HDL 47 (L) 04/26/2019   Lab Results  Component Value Date   LDLCALC 87 04/26/2019   Lab Results  Component Value Date   TRIG 57 04/26/2019   Lab Results  Component Value Date   CHOLHDL 3.1 04/26/2019   Lab Results  Component Value Date   HGBA1C 5.5 04/01/2015      Assessment & Plan:   Problem List Items Addressed This Visit      Cardiovascular and Mediastinum   Essential hypertension - Primary    Blood pressure little borderline today discussed that I really want to see her systolic less than 924.  We will plan to recheck it again before she goes today.  We definitely have some room to adjust her medication if needed.  She is due for repeat renal function.      Relevant Orders   COMPLETE METABOLIC PANEL WITH GFR   Lipid panel     Other   Hypertriglyceridemia    Due to recheck lipids.  Not currently on prescription medication for this.  He will work on [employer] and regular exercise.      Relevant Orders   COMPLETE METABOLIC PANEL WITH GFR   Lipid panel      No orders of the defined types were placed in this encounter.   Follow-up: No follow-ups on file.    Beatrice Lecher, MD

## 2020-02-19 NOTE — Assessment & Plan Note (Signed)
Blood pressure little borderline today discussed that I really want to see her systolic less than 067.  We will plan to recheck it again before she goes today.  We definitely have some room to adjust her medication if needed.  She is due for repeat renal function.

## 2020-02-19 NOTE — Assessment & Plan Note (Signed)
Due to recheck lipids.  Not currently on prescription medication for this.  He will work on [employer] and regular exercise.

## 2020-02-20 LAB — COMPLETE METABOLIC PANEL WITH GFR
AG Ratio: 1.3 (calc) (ref 1.0–2.5)
ALT: 29 U/L (ref 6–29)
AST: 28 U/L (ref 10–35)
Albumin: 3.7 g/dL (ref 3.6–5.1)
Alkaline phosphatase (APISO): 83 U/L (ref 37–153)
BUN/Creatinine Ratio: 16 (calc) (ref 6–22)
BUN: 18 mg/dL (ref 7–25)
CO2: 25 mmol/L (ref 20–32)
Calcium: 8.8 mg/dL (ref 8.6–10.4)
Chloride: 107 mmol/L (ref 98–110)
Creat: 1.15 mg/dL — ABNORMAL HIGH (ref 0.50–1.05)
GFR, Est African American: 62 mL/min/{1.73_m2} (ref >=60)
GFR, Est Non African American: 54 mL/min/{1.73_m2} — ABNORMAL LOW (ref >=60)
Globulin: 2.8 g/dL (calc) (ref 1.9–3.7)
Glucose, Bld: 96 mg/dL (ref 65–99)
Potassium: 4 mmol/L (ref 3.5–5.3)
Sodium: 141 mmol/L (ref 135–146)
Total Bilirubin: 0.5 mg/dL (ref 0.2–1.2)
Total Protein: 6.5 g/dL (ref 6.1–8.1)

## 2020-02-20 LAB — LIPID PANEL
Cholesterol: 161 mg/dL (ref <200)
HDL: 40 mg/dL — ABNORMAL LOW (ref >=50)
LDL Cholesterol (Calc): 87 mg/dL (calc)
Non-HDL Cholesterol (Calc): 121 mg/dL (calc) (ref <130)
Total CHOL/HDL Ratio: 4 (calc) (ref <5.0)
Triglycerides: 262 mg/dL — ABNORMAL HIGH (ref <150)

## 2020-02-22 ENCOUNTER — Other Ambulatory Visit: Payer: Self-pay | Admitting: *Deleted

## 2020-02-22 DIAGNOSIS — R7989 Other specified abnormal findings of blood chemistry: Secondary | ICD-10-CM

## 2020-03-11 ENCOUNTER — Other Ambulatory Visit: Payer: Self-pay

## 2020-03-11 ENCOUNTER — Encounter (HOSPITAL_BASED_OUTPATIENT_CLINIC_OR_DEPARTMENT_OTHER): Payer: Self-pay | Admitting: Plastic Surgery

## 2020-03-15 ENCOUNTER — Encounter (HOSPITAL_BASED_OUTPATIENT_CLINIC_OR_DEPARTMENT_OTHER)
Admission: RE | Admit: 2020-03-15 | Discharge: 2020-03-15 | Disposition: A | Discharge status: Home / Self Care | Payer: 59 | Source: Ambulatory Visit | Attending: Plastic Surgery | Admitting: Plastic Surgery

## 2020-03-15 ENCOUNTER — Other Ambulatory Visit (HOSPITAL_COMMUNITY)
Admission: RE | Admit: 2020-03-15 | Discharge: 2020-03-15 | Disposition: A | Discharge status: Home / Self Care | Payer: 59 | Source: Ambulatory Visit | Attending: Plastic Surgery | Admitting: Plastic Surgery

## 2020-03-15 DIAGNOSIS — Z01812 Encounter for preprocedural laboratory examination: Secondary | ICD-10-CM | POA: Insufficient documentation

## 2020-03-15 DIAGNOSIS — Z20822 Contact with and (suspected) exposure to covid-19: Secondary | ICD-10-CM | POA: Insufficient documentation

## 2020-03-15 LAB — BASIC METABOLIC PANEL
Anion gap: 10 (ref 5–15)
BUN: 19 mg/dL (ref 6–20)
CO2: 20 mmol/L — ABNORMAL LOW (ref 22–32)
Calcium: 8.4 mg/dL — ABNORMAL LOW (ref 8.9–10.3)
Chloride: 110 mmol/L (ref 98–111)
Creatinine, Ser: 1.06 mg/dL — ABNORMAL HIGH (ref 0.44–1.00)
GFR, Estimated: >60 mL/min (ref >60)
Glucose, Bld: 101 mg/dL — ABNORMAL HIGH (ref 70–99)
Potassium: 4 mmol/L (ref 3.5–5.1)
Sodium: 140 mmol/L (ref 135–145)

## 2020-03-15 LAB — SARS CORONAVIRUS 2 (TAT 6-24 HRS): SARS Coronavirus 2: NEGATIVE

## 2020-03-19 ENCOUNTER — Other Ambulatory Visit (HOSPITAL_BASED_OUTPATIENT_CLINIC_OR_DEPARTMENT_OTHER): Payer: Self-pay | Admitting: Plastic Surgery

## 2020-03-19 ENCOUNTER — Encounter (HOSPITAL_BASED_OUTPATIENT_CLINIC_OR_DEPARTMENT_OTHER)
Admission: RE | Disposition: A | Discharge status: Home / Self Care | Payer: Self-pay | Source: Home / Self Care | Attending: Plastic Surgery

## 2020-03-19 ENCOUNTER — Ambulatory Visit (HOSPITAL_BASED_OUTPATIENT_CLINIC_OR_DEPARTMENT_OTHER): Payer: 59 | Admitting: Anesthesiology

## 2020-03-19 ENCOUNTER — Ambulatory Visit (HOSPITAL_BASED_OUTPATIENT_CLINIC_OR_DEPARTMENT_OTHER)
Admission: RE | Admit: 2020-03-19 | Discharge: 2020-03-19 | Disposition: A | Discharge status: Home / Self Care | Payer: 59 | Attending: Plastic Surgery | Admitting: Plastic Surgery

## 2020-03-19 ENCOUNTER — Other Ambulatory Visit: Payer: Self-pay

## 2020-03-19 ENCOUNTER — Encounter (HOSPITAL_BASED_OUTPATIENT_CLINIC_OR_DEPARTMENT_OTHER): Payer: Self-pay | Admitting: Plastic Surgery

## 2020-03-19 DIAGNOSIS — G4733 Obstructive sleep apnea (adult) (pediatric): Secondary | ICD-10-CM | POA: Diagnosis not present

## 2020-03-19 DIAGNOSIS — Z79899 Other long term (current) drug therapy: Secondary | ICD-10-CM | POA: Insufficient documentation

## 2020-03-19 DIAGNOSIS — I10 Essential (primary) hypertension: Secondary | ICD-10-CM | POA: Diagnosis not present

## 2020-03-19 DIAGNOSIS — Z923 Personal history of irradiation: Secondary | ICD-10-CM | POA: Insufficient documentation

## 2020-03-19 DIAGNOSIS — Z9989 Dependence on other enabling machines and devices: Secondary | ICD-10-CM | POA: Diagnosis not present

## 2020-03-19 DIAGNOSIS — C50912 Malignant neoplasm of unspecified site of left female breast: Secondary | ICD-10-CM | POA: Insufficient documentation

## 2020-03-19 DIAGNOSIS — Z79810 Long term (current) use of selective estrogen receptor modulators (SERMs): Secondary | ICD-10-CM | POA: Insufficient documentation

## 2020-03-19 DIAGNOSIS — N6489 Other specified disorders of breast: Secondary | ICD-10-CM | POA: Insufficient documentation

## 2020-03-19 DIAGNOSIS — N651 Disproportion of reconstructed breast: Secondary | ICD-10-CM | POA: Diagnosis not present

## 2020-03-19 DIAGNOSIS — K589 Irritable bowel syndrome without diarrhea: Secondary | ICD-10-CM | POA: Insufficient documentation

## 2020-03-19 DIAGNOSIS — Z853 Personal history of malignant neoplasm of breast: Secondary | ICD-10-CM | POA: Diagnosis not present

## 2020-03-19 DIAGNOSIS — Z170 Estrogen receptor positive status [ER+]: Secondary | ICD-10-CM | POA: Insufficient documentation

## 2020-03-19 HISTORY — PX: MASTOPEXY: SHX5358

## 2020-03-19 HISTORY — DX: Malignant (primary) neoplasm, unspecified: C80.1

## 2020-03-19 HISTORY — DX: Sleep apnea, unspecified: G47.30

## 2020-03-19 LAB — POCT PREGNANCY, URINE: Preg Test, Ur: NEGATIVE

## 2020-03-19 SURGERY — MASTOPEXY
Anesthesia: General | Site: Breast | Laterality: Left

## 2020-03-19 MED ORDER — FENTANYL CITRATE (PF) 100 MCG/2ML IJ SOLN
INTRAMUSCULAR | Status: AC
  Filled 2020-03-19: qty 2

## 2020-03-19 MED ORDER — LIDOCAINE 2% (20 MG/ML) 5 ML SYRINGE
INTRAMUSCULAR | Status: AC
  Filled 2020-03-19: qty 5

## 2020-03-19 MED ORDER — BUPIVACAINE-EPINEPHRINE (PF) 0.25% -1:200000 IJ SOLN
INTRAMUSCULAR | Status: AC
  Filled 2020-03-19: qty 30

## 2020-03-19 MED ORDER — CHLORHEXIDINE GLUCONATE CLOTH 2 % EX PADS: 6.0000 | MEDICATED_PAD | Freq: Once | CUTANEOUS | Status: DC

## 2020-03-19 MED ORDER — PROMETHAZINE HCL 25 MG/ML IJ SOLN
INTRAMUSCULAR | Status: AC
  Filled 2020-03-19: qty 1

## 2020-03-19 MED ORDER — ACETAMINOPHEN 500 MG PO TABS
ORAL_TABLET | ORAL | Status: AC
  Filled 2020-03-19: qty 2

## 2020-03-19 MED ORDER — GABAPENTIN 300 MG PO CAPS
ORAL_CAPSULE | ORAL | Status: AC
  Filled 2020-03-19: qty 1

## 2020-03-19 MED ORDER — OXYCODONE HCL 5 MG PO TABS: 5.0000 mg | ORAL_TABLET | Freq: Three times a day (TID) | ORAL | 0 refills | Status: DC | PRN

## 2020-03-19 MED ORDER — BUPIVACAINE-EPINEPHRINE 0.25% -1:200000 IJ SOLN
INTRAMUSCULAR | Status: DC | PRN
  Administered 2020-03-19: 20 mL

## 2020-03-19 MED ORDER — MIDAZOLAM HCL 5 MG/5ML IJ SOLN
INTRAMUSCULAR | Status: DC | PRN
  Administered 2020-03-19: 2 mg via INTRAVENOUS

## 2020-03-19 MED ORDER — ONDANSETRON HCL 4 MG/2ML IJ SOLN
INTRAMUSCULAR | Status: AC
  Filled 2020-03-19: qty 2

## 2020-03-19 MED ORDER — PROPOFOL 10 MG/ML IV BOLUS
INTRAVENOUS | Status: AC
  Filled 2020-03-19: qty 20

## 2020-03-19 MED ORDER — CELECOXIB 200 MG PO CAPS
200.0000 mg | ORAL_CAPSULE | ORAL | Status: AC
  Administered 2020-03-19: 200 mg via ORAL

## 2020-03-19 MED ORDER — KETAMINE HCL 10 MG/ML IJ SOLN
INTRAMUSCULAR | Status: DC | PRN
  Administered 2020-03-19 (×2): 20 mg via INTRAVENOUS

## 2020-03-19 MED ORDER — CELECOXIB 200 MG PO CAPS
ORAL_CAPSULE | ORAL | Status: AC
  Filled 2020-03-19: qty 1

## 2020-03-19 MED ORDER — FENTANYL CITRATE (PF) 100 MCG/2ML IJ SOLN: 25.0000 ug | INTRAMUSCULAR | Status: DC | PRN

## 2020-03-19 MED ORDER — SCOPOLAMINE 1 MG/3DAYS TD PT72
1.0000 | MEDICATED_PATCH | TRANSDERMAL | Status: DC
  Administered 2020-03-19: 1.5 mg via TRANSDERMAL

## 2020-03-19 MED ORDER — LIDOCAINE HCL (CARDIAC) PF 100 MG/5ML IV SOSY
PREFILLED_SYRINGE | INTRAVENOUS | Status: DC | PRN
  Administered 2020-03-19: 80 mg via INTRAVENOUS

## 2020-03-19 MED ORDER — SUGAMMADEX SODIUM 200 MG/2ML IV SOLN
INTRAVENOUS | Status: DC | PRN
  Administered 2020-03-19: 200 mg via INTRAVENOUS

## 2020-03-19 MED ORDER — DEXAMETHASONE SODIUM PHOSPHATE 4 MG/ML IJ SOLN
INTRAMUSCULAR | Status: DC | PRN
  Administered 2020-03-19: 5 mg via INTRAVENOUS

## 2020-03-19 MED ORDER — GABAPENTIN 300 MG PO CAPS
300.0000 mg | ORAL_CAPSULE | ORAL | Status: AC
  Administered 2020-03-19: 300 mg via ORAL

## 2020-03-19 MED ORDER — MIDAZOLAM HCL 2 MG/2ML IJ SOLN
INTRAMUSCULAR | Status: AC
  Filled 2020-03-19: qty 2

## 2020-03-19 MED ORDER — CEFAZOLIN SODIUM-DEXTROSE 2-4 GM/100ML-% IV SOLN
INTRAVENOUS | Status: AC
  Filled 2020-03-19: qty 100

## 2020-03-19 MED ORDER — ONDANSETRON HCL 4 MG/2ML IJ SOLN
INTRAMUSCULAR | Status: DC | PRN
  Administered 2020-03-19: 4 mg via INTRAVENOUS

## 2020-03-19 MED ORDER — AMISULPRIDE (ANTIEMETIC) 5 MG/2ML IV SOLN
10.0000 mg | Freq: Once | INTRAVENOUS | Status: AC
  Administered 2020-03-19: 10 mg via INTRAVENOUS

## 2020-03-19 MED ORDER — SCOPOLAMINE 1 MG/3DAYS TD PT72
MEDICATED_PATCH | TRANSDERMAL | Status: AC
  Filled 2020-03-19: qty 1

## 2020-03-19 MED ORDER — FENTANYL CITRATE (PF) 100 MCG/2ML IJ SOLN
INTRAMUSCULAR | Status: DC | PRN
  Administered 2020-03-19 (×2): 50 ug via INTRAVENOUS
  Administered 2020-03-19 (×2): 100 ug via INTRAVENOUS

## 2020-03-19 MED ORDER — LACTATED RINGERS IV SOLN: INTRAVENOUS | Status: DC

## 2020-03-19 MED ORDER — PROMETHAZINE HCL 25 MG/ML IJ SOLN
12.5000 mg | Freq: Once | INTRAMUSCULAR | Status: AC
  Administered 2020-03-19: 6.25 mg via INTRAVENOUS

## 2020-03-19 MED ORDER — KETAMINE HCL 100 MG/ML IJ SOLN
INTRAMUSCULAR | Status: AC
  Filled 2020-03-19: qty 1

## 2020-03-19 MED ORDER — CEFAZOLIN SODIUM-DEXTROSE 2-4 GM/100ML-% IV SOLN
2.0000 g | INTRAVENOUS | Status: AC
  Administered 2020-03-19: 2 g via INTRAVENOUS

## 2020-03-19 MED ORDER — AMISULPRIDE (ANTIEMETIC) 5 MG/2ML IV SOLN
INTRAVENOUS | Status: AC
  Filled 2020-03-19: qty 4

## 2020-03-19 MED ORDER — DEXAMETHASONE SODIUM PHOSPHATE 10 MG/ML IJ SOLN
INTRAMUSCULAR | Status: AC
  Filled 2020-03-19: qty 1

## 2020-03-19 MED ORDER — ROCURONIUM BROMIDE 100 MG/10ML IV SOLN
INTRAVENOUS | Status: DC | PRN
  Administered 2020-03-19: 70 mg via INTRAVENOUS

## 2020-03-19 MED ORDER — PROPOFOL 10 MG/ML IV BOLUS
INTRAVENOUS | Status: DC | PRN
  Administered 2020-03-19: 150 mg via INTRAVENOUS

## 2020-03-19 MED ORDER — ACETAMINOPHEN 500 MG PO TABS
1000.0000 mg | ORAL_TABLET | ORAL | Status: AC
  Administered 2020-03-19: 1000 mg via ORAL

## 2020-03-19 MED ORDER — PHENYLEPHRINE HCL (PRESSORS) 10 MG/ML IV SOLN
INTRAVENOUS | Status: DC | PRN
  Administered 2020-03-19: 120 ug via INTRAVENOUS

## 2020-03-19 MED FILL — oxyCODONE HCL 5 MG TABS: 5 | 7 days supply | Qty: 20 | Fill #0

## 2020-03-19 SURGICAL SUPPLY — 60 items
BAG DECANTER FOR FLEXI CONT (MISCELLANEOUS) IMPLANT
BINDER BREAST LRG (GAUZE/BANDAGES/DRESSINGS) IMPLANT
BINDER BREAST MEDIUM (GAUZE/BANDAGES/DRESSINGS) IMPLANT
BINDER BREAST XLRG (GAUZE/BANDAGES/DRESSINGS) ×2 IMPLANT
BINDER BREAST XXLRG (GAUZE/BANDAGES/DRESSINGS) IMPLANT
BLADE SURG 10 STRL SS (BLADE) ×4 IMPLANT
BLADE SURG 15 STRL LF DISP TIS (BLADE) ×2 IMPLANT
BLADE SURG 15 STRL SS (BLADE) ×4
BNDG GAUZE ELAST 4 BULKY (GAUZE/BANDAGES/DRESSINGS) ×4 IMPLANT
CANISTER SUCT 1200ML W/VALVE (MISCELLANEOUS) ×2 IMPLANT
CHLORAPREP W/TINT 26 (MISCELLANEOUS) ×4 IMPLANT
COVER MAYO STAND STRL (DRAPES) ×2 IMPLANT
COVER WAND RF STERILE (DRAPES) IMPLANT
DECANTER SPIKE VIAL GLASS SM (MISCELLANEOUS) ×2 IMPLANT
DERMABOND ADVANCED (GAUZE/BANDAGES/DRESSINGS) ×1
DERMABOND ADVANCED .7 DNX12 (GAUZE/BANDAGES/DRESSINGS) ×1 IMPLANT
DRAIN CHANNEL 15F RND FF W/TCR (WOUND CARE) ×2 IMPLANT
DRAIN CHANNEL 19F RND (DRAIN) IMPLANT
DRAPE TOP ARMCOVERS (MISCELLANEOUS) ×2 IMPLANT
DRAPE U-SHAPE 76X120 STRL (DRAPES) ×2 IMPLANT
DRAPE UTILITY XL STRL (DRAPES) ×2 IMPLANT
DRSG PAD ABDOMINAL 8X10 ST (GAUZE/BANDAGES/DRESSINGS) ×4 IMPLANT
DRSG TEGADERM 2-3/8X2-3/4 SM (GAUZE/BANDAGES/DRESSINGS) IMPLANT
ELECT BLADE 4.0 EZ CLEAN MEGAD (MISCELLANEOUS)
ELECT COATED BLADE 2.86 ST (ELECTRODE) ×2 IMPLANT
ELECT REM PT RETURN 9FT ADLT (ELECTROSURGICAL) ×2
ELECTRODE BLDE 4.0 EZ CLN MEGD (MISCELLANEOUS) IMPLANT
ELECTRODE REM PT RTRN 9FT ADLT (ELECTROSURGICAL) ×1 IMPLANT
EVACUATOR SILICONE 100CC (DRAIN) ×2 IMPLANT
GAUZE SPONGE 4X4 12PLY STRL (GAUZE/BANDAGES/DRESSINGS) IMPLANT
GAUZE SPONGE 4X4 12PLY STRL LF (GAUZE/BANDAGES/DRESSINGS) IMPLANT
GLOVE SURG ENC MOIS LTX SZ6 (GLOVE) ×2 IMPLANT
GOWN STRL REUS W/ TWL LRG LVL3 (GOWN DISPOSABLE) ×2 IMPLANT
GOWN STRL REUS W/TWL LRG LVL3 (GOWN DISPOSABLE) ×4
MARKER SKIN DUAL TIP RULER LAB (MISCELLANEOUS) IMPLANT
NEEDLE HYPO 25X1 1.5 SAFETY (NEEDLE) IMPLANT
NS IRRIG 1000ML POUR BTL (IV SOLUTION) ×2 IMPLANT
PACK BASIN DAY SURGERY FS (CUSTOM PROCEDURE TRAY) ×2 IMPLANT
PENCIL SMOKE EVACUATOR (MISCELLANEOUS) ×2 IMPLANT
PIN SAFETY STERILE (MISCELLANEOUS) IMPLANT
SHEET MEDIUM DRAPE 40X70 STRL (DRAPES) ×4 IMPLANT
SLEEVE SCD COMPRESS KNEE MED (MISCELLANEOUS) ×2 IMPLANT
SPONGE LAP 18X18 RF (DISPOSABLE) ×6 IMPLANT
STAPLER VISISTAT 35W (STAPLE) ×2 IMPLANT
STRIP CLOSURE SKIN 1/2X4 (GAUZE/BANDAGES/DRESSINGS) IMPLANT
SUT ETHILON 2 0 FS 18 (SUTURE) ×2 IMPLANT
SUT MNCRL AB 4-0 PS2 18 (SUTURE) ×4 IMPLANT
SUT PDS AB 2-0 CT2 27 (SUTURE) IMPLANT
SUT VIC AB 3-0 PS1 18 (SUTURE) ×6
SUT VIC AB 3-0 PS1 18XBRD (SUTURE) ×3 IMPLANT
SUT VIC AB 3-0 SH 27 (SUTURE)
SUT VIC AB 3-0 SH 27X BRD (SUTURE) IMPLANT
SUT VICRYL 4-0 PS2 18IN ABS (SUTURE) ×2 IMPLANT
SYR BULB IRRIG 60ML STRL (SYRINGE) ×2 IMPLANT
SYR CONTROL 10ML LL (SYRINGE) ×2 IMPLANT
TAPE MEASURE VINYL STERILE (MISCELLANEOUS) ×2 IMPLANT
TOWEL GREEN STERILE FF (TOWEL DISPOSABLE) ×4 IMPLANT
TUBE CONNECTING 20X1/4 (TUBING) ×2 IMPLANT
UNDERPAD 30X36 HEAVY ABSORB (UNDERPADS AND DIAPERS) ×2 IMPLANT
YANKAUER SUCT BULB TIP NO VENT (SUCTIONS) ×2 IMPLANT

## 2020-03-19 NOTE — H&P (Signed)
  Subjective:     Patient ID: [name] is a 55 y.o. female.  HPI  Presents for left breast mastopexy. Diagnosed 2020 with right breast ca. Underwent right lumpectomy SLN with final pathology pT1c pN0, stage Ia(1.7 cm)  IDC, ER/PR+, Her2-. +LVI, 0/4 SLN.   Completed adjuvant radiation 04/21/2019. On tamoxifen  Genetics negative  Prior to lumpectomy 36 DD. Notes post RT asymmetry breasts with right smaller. Wt stable  MMG 10/03/2019  PMH significant for OSA on CPAP, HTN, IBS.  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 Lives with spouse, has 2 kids.  Review of Systems Remainder 12 point review negative    Objective:   Physical Exam Cardiovascular:     Rate and Rhythm: Normal rate. Normal heart sounds Pulmonary:     Effort: Pulmonary effort is normal. Clear to auscultation Lymphadenopathy:     Upper Body:     Right upper body: No axillary adenopathy.     Left upper body: No axillary adenopathy.   Right breast with hyperpigmentation, areolar scar, induration/edema consistent with RT Left breast grade 3 ptosis Lateral chest wall with more fullness left No masses palpable Sn to nipple R 21 L 25.5 cm BW R 20 L 24 cm Nipple to IMF R 8 L 9 cm     Assessment:     History right breast ca s/p lumpectomy SLN History therapeutic radiation Post operative asymmetry breasts    Plan:     Plan left breast mastopexy. Reviewed anchor type scars, OP surgery, drain, post operative visits and limitations, recovery. Diminished sensation nipple and breast skin, risk of nipple loss, wound healing problems, asymmetry, incidental carcinoma, changes with wt gain/loss, aging, unacceptable cosmetic appearance reviewed. Reviewed radiated breast will not age/cevelop as much ptosis over time compared with left breast and counseled will experience some degree recurrent asymmetry over time. Reviewed breast mound volume close but still anticipate left  breast tissue excision especially over LOQ and chest wall to aid with symmetry.  Additional risks including but not limited to bleeding infection seroma hematoma damage to adjacent structures blood clots in legs and lungs reviewed.

## 2020-03-19 NOTE — Op Note (Signed)
Operative Note   DATE OF OPERATION: 1.11.22  LOCATION: [facility]-outpatient  SURGICAL DIVISION: Plastic Surgery  PREOPERATIVE DIAGNOSES:  1. History breast cancer 2. History therapeutic radiation 3. Post operative asymmetry breasts  POSTOPERATIVE DIAGNOSES:  same  PROCEDURE:  Left breast mastopexy  SURGEON: Irene Limbo MD MBA  ASSISTANT: none  ANESTHESIA:  General.   EBL: 35 ml  COMPLICATIONS: None immediate.   INDICATIONS FOR PROCEDURE:  The patient, [name], is a 55 y.o. female born on [birth year], is here for left breast mastopexy to address asymmetry breasts following left breast lumpectomy and adjuvant radiation.   FINDINGS: Left mastopexy 88 g.  DESCRIPTION OF PROCEDURE:  The patient was marked standing in the preoperative area to mark sternal notch, chest midline, anterior axillary lines, inframammary folds. The location of new nipple areolar complex was marked on right symmetric with left. With aid of Wise pattern marker, location of new nipple areolar complex and vertical limbs (8cm) were markedby displacement of breasts along meridian.The patient was taken to the operating room. SCDs were placedand IV antibiotics were given. The patient's operative site was prepped and draped in a sterile fashion. A time out was performed and all information was confirmed to be correct.   Over left breast, areola incised at its borders. Superior pedicle marked and de epithelialized. Pedicle developed to chest wall. Medial and lateral pillars elevated. Lower pole and lateral breast tissue excised. Patient tailor tacked closed.  Patient brought to upright sitting position and assessed for symmetry. Patent returned to supine position. Breast cavity irrigated and hemostasis obtained.Local anesthetic infiltrated. 15 Fr JP placed and secured with 2-0 nylon. Closure completed with 3-0 vicryl to approximate dermis along inframammary fold and vertical limb. NAC inset  with 4-0 vicryl in dermis. Skin closure completed with 4-0 monocryl subcuticular throughout. Tissue adhesive applied. Dry dressing and breast binder applied.  The patient was allowed to wake from anesthesia, extubated and taken to the recovery room in satisfactory condition.   SPECIMENS: left breast mastopexy  DRAINS: 15 Fr JP in left breast

## 2020-03-19 NOTE — Discharge Instructions (Signed)
Post Anesthesia Home Care Instructions  Activity: Get plenty of rest for the remainder of the day. A responsible individual must stay with you for 24 hours following the procedure.  For the next 24 hours, DO NOT: -Drive a car -Paediatric nurse -Drink alcoholic beverages -Take any medication unless instructed by your physician -Make any legal decisions or sign important papers.  Meals: Start with liquid foods such as gelatin or soup. Progress to regular foods as tolerated. Avoid greasy, spicy, heavy foods. If nausea and/or vomiting occur, drink only clear liquids until the nausea and/or vomiting subsides. Call your physician if vomiting continues.  Special Instructions/Symptoms: Your throat may feel dry or sore from the anesthesia or the breathing tube placed in your throat during surgery. If this causes discomfort, gargle with warm salt water. The discomfort should disappear within 24 hours.  If you had a scopolamine patch placed behind your ear for the management of post- operative nausea and/or vomiting:  1. The medication in the patch is effective for 72 hours, after which it should be removed.  Wrap patch in a tissue and discard in the trash. Wash hands thoroughly with soap and water. 2. You may remove the patch earlier than 72 hours if you experience unpleasant side effects which may include dry mouth, dizziness or visual disturbances. 3. Avoid touching the patch. Wash your hands with soap and water after contact with the patch.  No Tylenol or ibuprofen until after 4pm today if needed  About my Jackson-Pratt Bulb Drain  What is a Jackson-Pratt bulb? A Jackson-Pratt is a soft, round device used to collect drainage. It is connected to a long, thin drainage catheter, which is held in place by one or two small stiches near your surgical incision site. When the bulb is squeezed, it forms a vacuum, forcing the drainage to empty into the bulb.  Emptying the Jackson-Pratt bulb- To  empty the bulb: 1. Release the plug on the top of the bulb. 2. Pour the bulb's contents into a measuring container which your nurse will provide. 3. Record the time emptied and amount of drainage. Empty the drain(s) as often as your     doctor or nurse recommends.  Date                  Time                    Amount (Drain 1)                 Amount (Drain 2)  _____________________________________________________________________  _____________________________________________________________________  _____________________________________________________________________  _____________________________________________________________________  _____________________________________________________________________  _____________________________________________________________________  _____________________________________________________________________  _____________________________________________________________________  Squeezing the Jackson-Pratt Bulb- To squeeze the bulb: 1. Make sure the plug at the top of the bulb is open. 2. Squeeze the bulb tightly in your fist. You will hear air squeezing from the bulb. 3. Replace the plug while the bulb is squeezed. 4. Use a safety pin to attach the bulb to your clothing. This will keep the catheter from     pulling at the bulb insertion site.  When to call your doctor- Call your doctor if:  Drain site becomes red, swollen or hot.  You have a fever greater than 101 degrees F.  There is oozing at the drain site.  Drain falls out (apply a guaze bandage over the drain hole and secure it with tape).  Drainage increases daily not related to activity patterns. (You will usually have more drainage when you are active  than when you are resting.)  Drainage has a bad odor.

## 2020-03-19 NOTE — Transfer of Care (Signed)
Immediate Anesthesia Transfer of Care Note  Patient: [name]  Procedure(s) Performed: MASTOPEXY (Left Breast)  Patient Location: PACU  Anesthesia Type:General  Level of Consciousness: awake, alert  and oriented  Airway & Oxygen Therapy: Patient Spontanous Breathing and Patient connected to face mask oxygen  Post-op Assessment: Report given to RN and Post -op Vital signs reviewed and stable  Post vital signs: Reviewed and stable  Last Vitals:  Vitals Value Taken Time  BP 152/87 03/19/20 1350  Temp    Pulse 86 03/19/20 1351  Resp 23 03/19/20 1351  SpO2 92 % 03/19/20 1351  Vitals shown include unvalidated device data.  Last Pain:  Vitals:   03/19/20 0955  TempSrc: Oral  PainSc: 0-No pain      Patients Stated Pain Goal: 4 (96/78/93 8101)  Complications: No complications documented.

## 2020-03-19 NOTE — Anesthesia Preprocedure Evaluation (Addendum)
Anesthesia Evaluation  Patient identified by MRN, date of birth, ID band Patient awake    Reviewed: Allergy & Precautions, NPO status , Patient's Chart, lab work & pertinent test results  Airway Mallampati: III  TM Distance: >3 FB Neck ROM: Full  Mouth opening: Limited Mouth Opening  Dental no notable dental hx. (+) Teeth Intact, Dental Advisory Given   Pulmonary sleep apnea and Continuous Positive Airway Pressure Ventilation ,    Pulmonary exam normal breath sounds clear to auscultation       Cardiovascular hypertension, Pt. on medications Normal cardiovascular exam Rhythm:Regular Rate:Normal     Neuro/Psych negative neurological ROS  negative psych ROS   GI/Hepatic negative GI ROS, Neg liver ROS,   Endo/Other  negative endocrine ROS  Renal/GU negative Renal ROS  negative genitourinary   Musculoskeletal negative musculoskeletal ROS (+)   Abdominal   Peds  Hematology negative hematology ROS (+)   Anesthesia Other Findings Right breat CA  Reproductive/Obstetrics                            Anesthesia Physical Anesthesia Plan  ASA: III  Anesthesia Plan: General   Post-op Pain Management:    Induction: Intravenous  PONV Risk Score and Plan: 3 and Midazolam, Dexamethasone and Ondansetron  Airway Management Planned: Oral ETT and LMA  Additional Equipment:   Intra-op Plan:   Post-operative Plan: Extubation in OR  Informed Consent: I have reviewed the patients History and Physical, chart, labs and discussed the procedure including the risks, benefits and alternatives for the proposed anesthesia with the patient or authorized representative who has indicated his/her understanding and acceptance.     Dental advisory given  Plan Discussed with: CRNA  Anesthesia Plan Comments:        Anesthesia Quick Evaluation

## 2020-03-19 NOTE — Anesthesia Postprocedure Evaluation (Signed)
Anesthesia Post Note  Patient: [name]  Procedure(s) Performed: MASTOPEXY (Left Breast)     Patient location during evaluation: PACU Anesthesia Type: General Level of consciousness: awake and alert Pain management: pain level controlled Vital Signs Assessment: post-procedure vital signs reviewed and stable Respiratory status: spontaneous breathing, nonlabored ventilation, respiratory function stable and patient connected to nasal cannula oxygen Cardiovascular status: blood pressure returned to baseline and stable Postop Assessment: no apparent nausea or vomiting Anesthetic complications: no   No complications documented.  Last Vitals:  Vitals:   03/19/20 1430 03/19/20 1445  BP: 137/67 122/72  Pulse: 80 (!) 103  Resp: (!) 26 20  Temp:    SpO2: 98% 98%    Last Pain:  Vitals:   03/19/20 1515  TempSrc:   PainSc: 0-No pain                 Belenda Cruise P Primitivo Merkey

## 2020-03-19 NOTE — Anesthesia Procedure Notes (Signed)
Procedure Name: Intubation Performed by: Arron Mcnaught M, CRNA Pre-anesthesia Checklist: Patient identified, Emergency Drugs available, Suction available and Patient being monitored Patient Re-evaluated:Patient Re-evaluated prior to induction Oxygen Delivery Method: Circle system utilized Preoxygenation: Pre-oxygenation with 100% oxygen Induction Type: IV induction Ventilation: Mask ventilation without difficulty Laryngoscope Size: Mac and 3 Grade View: Grade I Tube type: Oral Tube size: 7.0 mm Number of attempts: 1 Airway Equipment and Method: Stylet and Oral airway Placement Confirmation: ETT inserted through vocal cords under direct vision,  positive ETCO2 and breath sounds checked- equal and bilateral Tube secured with: Tape Dental Injury: Teeth and Oropharynx as per pre-operative assessment        

## 2020-03-20 ENCOUNTER — Encounter (HOSPITAL_BASED_OUTPATIENT_CLINIC_OR_DEPARTMENT_OTHER): Payer: Self-pay | Admitting: Plastic Surgery

## 2020-03-20 LAB — SURGICAL PATHOLOGY

## 2020-04-23 ENCOUNTER — Other Ambulatory Visit: Payer: Self-pay | Admitting: Oncology

## 2020-04-23 MED FILL — LOSARTAN-HCTZ 50-12.5 MG TA: 50-12.5 | 90 days supply | Qty: 90 | Fill #1

## 2020-04-23 MED FILL — TAMOXIFEN CITRATE 20 MG TAB: 20 | 90 days supply | Qty: 90 | Fill #0

## 2020-05-12 ENCOUNTER — Encounter: Payer: Self-pay | Admitting: Family Medicine

## 2020-05-16 DIAGNOSIS — G4733 Obstructive sleep apnea (adult) (pediatric): Secondary | ICD-10-CM | POA: Diagnosis not present

## 2020-06-10 NOTE — Progress Notes (Signed)
[facility]  [phone] [phone]     ID: [name] DOB: 1965-09-16  MR#: [id number]  YYQ#:[id number]  Patient Care Team: Hali Marry, MD as PCP - General Louretta Shorten, MD (Obstetrics and Gynecology) Mauro Kaufmann, RN as Oncology Nurse Navigator Rockwell Germany, RN as Oncology Nurse Navigator [name], MD as Consulting Physician (General Surgery) Georgi Tuel, Virgie Dad, MD as Consulting Physician (Oncology) Kyung Rudd, MD as Consulting Physician (Radiation Oncology) Chauncey Cruel, MD OTHER MD:  CHIEF COMPLAINT: Estrogen receptor positive breast cancer  CURRENT TREATMENT: tamoxifen   INTERVAL HISTORY: Yarieliz Wasser" returns today for follow up of her estrogen receptor positive breast cancer.  She started tamoxifen on 05/08/2019.  She has no significant side effects from this medication that she is aware of.  Since her last visit, she underwent bilateral diagnostic mammography with tomography at [facility] on 10/03/2019 showing: breast density category C; no evidence of malignancy in either breast.   She also underwent left breast mastopexy on 03/19/2020 under Dr. Iran Planas. Pathology from the procedure ([id number]) was benign.   REVIEW OF SYSTEMS: [name] has occasional pain in her knees and shoulders which I reassured her is not going to be related to her tamoxifen.  She is currently not exercising regularly but does have an elliptical at home and she is planning to start using it and also she says to improve her diet.  She is very pleased with the surgical results from her left mastopexy which is healing well.  She has very minimal discomfort there and sees no problem with proceeding with mammography in July as scheduled.  A detailed review of systems today was otherwise stable     COVID 19 VACCINATION STATUS: fully vaccinated [employer]), with booster October 2021; positive at home test on  05/12/2020   HISTORY OF CURRENT ILLNESS: From the original intake note:  [name] had routine screening mammography on 12/05/2018 showing a possible abnormality in the right breast. She underwent right diagnostic mammography with tomography and right breast ultrasonography at [facility] on 12/08/2018 showing: breast density category C; 1.7 cm irregular mass with associated distortion in the right breast at 1:30; right axilla negative for lymphadenopathy.   Accordingly on 12/09/2018 she proceeded to biopsy of the right breast area in question. The pathology from this procedure ([id number]) showed: invasive ductal carcinoma, grade 2; ductal carcinoma in situ, intermediate grade. Prognostic indicators significant for: estrogen receptor, 90% positive and progesterone receptor, 95% positive, both with strong staining intensity. Proliferation marker Ki67 at 10%. HER2 equivocal by immunohistochemistry (2+), but negative by fluorescent in situ hybridization with a signals ratio 1.32 and number per cell 2.25.  The patient's subsequent history is as detailed below.   PAST MEDICAL HISTORY: Past Medical History:  Diagnosis Date  . Cancer ([place])   . Eczema    legs  . Family history of brain cancer   . Family history of breast cancer   . Family history of lung cancer   . Family history of ovarian cancer   . Hallux valgus 06/2012   left  . Hammertoe 06/2012   left second  . Heart murmur    since birth, has had no testing, no problems  . History of frequent ear infections   . Hypertension    under control with med., has been on med. x 3 yr.  . Irritable bowel syndrome (IBS)    no current med.  . Personal history of  radiation therapy   . Sleep apnea   . Wears hearing aid    bilateral    PAST SURGICAL HISTORY: Past Surgical History:  Procedure Laterality Date  . BREAST BIOPSY Right 12/22/2018  . BREAST LUMPECTOMY Right 01/19/2019  . BREAST LUMPECTOMY WITH RADIOACTIVE SEED AND  SENTINEL LYMPH NODE BIOPSY Right 01/19/2019   Procedure: RIGHT BREAST LUMPECTOMY WITH RADIOACTIVE SEED AND SENTINEL LYMPH NODE BIOPSY;  Surgeon: [name], MD;  Location: [facility];  Service: General;  Laterality: Right;  . EXPLORATORY TYMPANOTOMY Left 06/25/2000   with lysis of adhesions  . HAMMER TOE SURGERY Left 06/16/2012   Procedure: LEFT SCARF MODIFIED MCBRIDE AND SECOND Metatarsal WEIL;  Surgeon: Wylene Simmer, MD;  Location: [facility];  Service: Orthopedics;  Laterality: Left;  Marland Kitchen MASTOPEXY Left 03/19/2020   Procedure: MASTOPEXY;  Surgeon: Irene Limbo, MD;  Location: [facility];  Service: Plastics;  Laterality: Left;  . TONSILLECTOMY    . TYMPANOPLASTY Left 06/25/2000  . TYMPANOSTOMY TUBE PLACEMENT  10/10/2009    FAMILY HISTORY: Family History  Problem Relation Age of Onset  . Alzheimer's disease Mother   . Brain cancer Father 53       Glioblastoma multiforma.   . Ovarian cancer Paternal Aunt        dx. 26s or 50s  . Lung cancer Other        dx. 78s (maternal great-aunt)  Patient's father was 8 years old when he died from glioblastoma, diagnosed age 37.. Patient's mother died at age 76. The patient notes a family hx of breast and ovarian cancer. A paternal aunt was diagnosed with ovarian cancer (age unsure) and a maternal cousin with breast cancer (age unknown).  The patient has 3 brothers, no sisters.    GYNECOLOGIC HISTORY:  No LMP recorded. Menarche: 55 years old Age at first live birth: 55 years old [place] P 2 LMP 12/14/2018, having regular periods Contraceptive: yes, used for 29 years, stopped 12/2018; currently barrier methods HRT n/a  Hysterectomy? no BSO? no   SOCIAL HISTORY: (updated April 2022) [name] "[name]" is currently working as an Investment banker, corporate for [employer]. Husband [name] is a Designer, jewellery. Daughter [name] is a Programme researcher, broadcasting/film/video at [employer] here in [place].  She had the patient's son [name] are enrolled at [employer] [facility] [employer] [facility] [employer].  The  patient attends [employer] in [place].    ADVANCED DIRECTIVES: not in place.  In the absence of any documents to the contrary husband [name] is automatically her HCPOA.   HEALTH MAINTENANCE: Social History   Tobacco Use  . Smoking status: Never Smoker  . Smokeless tobacco: Never Used  Vaping Use  . Vaping Use: Never used  Substance Use Topics  . Alcohol use: Not Currently    Comment: occasionally  . Drug use: No     Colonoscopy: never done  PAP: 12/01/2018, negative  Bone density: not on file, due for repeat 01/2019   Allergies  Allergen Reactions  . Erythromycin Nausea And Vomiting    FACIAL NUMBNESS  . Nitrofurantoin Other (See Comments)    GI UPSET  . Lexapro [Escitalopram Oxalate] Nausea Only  . Lisinopril Cough    Current Outpatient Medications  Medication Sig Dispense Refill  . losartan-hydrochlorothiazide (HYZAAR) 50-12.5 MG tablet TAKE 1 TABLET BY MOUTH DAILY. 90 tablet 1  . oxyCODONE (OXY IR/ROXICODONE) 5 MG immediate release tablet TAKE 1 TABLET BY MOUTH EVERY 8 HOURS AS NEEDED 20 tablet 0  . tamoxifen (NOLVADEX) 20 MG tablet TAKE 1  TABLET BY MOUTH ONCE DAILY STARTING 90 tablet 12   No current facility-administered medications for this visit.    OBJECTIVE:  white woman who appears stated age  1:   06/11/20 0938  BP: (!) 167/89  Pulse: (!) 108  Resp: 18  Temp: 97.8 F (36.6 C)  SpO2: 98%     Body mass index is 32.84 kg/m.   Wt Readings from Last 3 Encounters:  06/11/20 173 lb 12.8 oz (78.8 kg)  03/19/20 173 lb 1 oz (78.5 kg)  02/19/20 173 lb (78.5 kg)      ECOG FS:1 - Symptomatic but completely ambulatory  Sclerae unicteric, EOMs intact Wearing a mask No cervical or supraclavicular adenopathy Lungs no rales or rhonchi Heart regular rate and rhythm Abd soft, obese, nontender, positive bowel sounds MSK no focal spinal tenderness, no upper extremity lymphedema Neuro: nonfocal, well oriented, appropriate affect Breasts:  The right breast is status post lumpectomy and radiation.  There is mild residual hyperpigmentation but no evidence of local recurrence.  The left breast is status post reduction mammoplasty.  It is healing nicely.  The cosmetic result is very good.  Both axillae are benign.   LAB RESULTS:  CMP     Component Value Date/Time   NA 140 03/15/2020 0948   K 4.0 03/15/2020 0948   CL 110 03/15/2020 0948   CO2 20 (L) 03/15/2020 0948   GLUCOSE 101 (H) 03/15/2020 0948   BUN 19 03/15/2020 0948   CREATININE 1.06 (H) 03/15/2020 0948   CREATININE 1.15 (H) 02/19/2020 1023   CALCIUM 8.4 (L) 03/15/2020 0948   PROT 6.5 02/19/2020 1023   ALBUMIN 3.4 (L) 08/22/2019 1349   AST 28 02/19/2020 1023   AST 28 12/21/2018 1220   ALT 29 02/19/2020 1023   ALT 23 12/21/2018 1220   ALKPHOS 84 08/22/2019 1349   BILITOT 0.5 02/19/2020 1023   BILITOT 0.5 12/21/2018 1220   GFRNONAA >60 03/15/2020 0948   GFRNONAA 54 (L) 02/19/2020 1023   GFRAA 62 02/19/2020 1023    No results found for: TOTALPROTELP, ALBUMINELP, A1GS, A2GS, BETS, BETA2SER, GAMS, MSPIKE, SPEI  No results found for: KPAFRELGTCHN, LAMBDASER, KAPLAMBRATIO  Lab Results  Component Value Date   WBC 5.4 06/11/2020   NEUTROABS 3.1 06/11/2020   HGB 13.5 06/11/2020   HCT 41.5 06/11/2020   MCV 87.4 06/11/2020   PLT 231 06/11/2020   No results found for: LABCA2  No components found for: HWKGSU110  No results for input(s): INR in the last 168 hours.  No results found for: LABCA2  No results found for: RPR945  No results found for: OPF292  No results found for: KMQ286  No results found for: CA2729  No components found for: HGQUANT  No results found for: CEA1 / No results found for: CEA1   No results found for: AFPTUMOR  No results found for: CHROMOGRNA  No results found for: HGBA, HGBA2QUANT, HGBFQUANT, HGBSQUAN (Hemoglobinopathy evaluation)   No results found for: LDH  No results found for: IRON, TIBC, IRONPCTSAT (Iron and  TIBC)  No results found for: FERRITIN  Urinalysis No results found for: COLORURINE, APPEARANCEUR, LABSPEC, PHURINE, GLUCOSEU, HGBUR, BILIRUBINUR, KETONESUR, PROTEINUR, UROBILINOGEN, NITRITE, LEUKOCYTESUR   STUDIES: No results found.    ELIGIBLE FOR AVAILABLE RESEARCH PROTOCOL: [place]  ASSESSMENT: 89 y.[place] Schim[place], [place] woman status post right breast upper inner quadrant biopsy 12/09/2018 for a clinical T1c N0, stage IA invasive ductal carcinoma, grade 2, estrogen and progesterone receptor positive, HER-2 nonamplified, with an  MIB-1 of 10%  (1) status post right lumpectomy and sentinel lymph node sampling 01/19/2019 for a pT1c pN0, stage IA invasive ductal carcinoma, grade 2, with close but negative margins and evidence of lymphovascular invasion  (a) a total of 4 right axillary lymph nodes were removed  (2) Oncotype score of 18 predicts a risk of recurrence outside the breast over the next 9 years of 5% if the patient's only systemic therapy is antiestrogens for 5 years.  It also predicts no benefit from chemotherapy.  (3) adjuvant radiation 03/07/19 - 04/21/19: The patient initially received a dose of 50 Gy in 29factions to the right breast using whole-breast tangent fields. This was delivered using a 3-D conformal technique. The patient then received a boost to the seroma. This delivered an additional 14.4Gy in 848fctions using a 3-field photon boost. The total dose was 64.4 Gy.  (4) tamoxifen started 05/08/2019  (5) genetics testing 04/08/2019 through the Common Hereditary Cancers Panel offered by [facility] found no deleterious mutations in APC, ATM, AXIN2, BARD1, BMPR1A, BRCA1, BRCA2, BRIP1, CDH1, CDK4, CDKN2A (p14ARF), CDKN2A (p16INK4a), CHEK2, CTNNA1, DICER1, EPCAM (Deletion/duplication testing only), GREM1 (promoter region deletion/duplication testing only), KIT, MEN1, MLH1, MSH2, MSH3, MSH6, MUTYH, NBN, NF1, NHTL1, PALB2, PDGFRA, PMS2, POLD1, POLE, PTEN, RAD50, RAD51C, RAD51D,  RNF43, SDHB, SDHC, SDHD, SMAD4, SMARCA4. STK11, TP53, TSC1, TSC2, and VHL.  The following genes were evaluated for sequence changes only: SDHA and HOXB13 c.251G>A variant only.    PLAN: KiMaudie Mercurys a year and a half out from definitive surgery for her breast cancer with no evidence of disease recurrence.  This is very favorable.  She is tolerating tamoxifen well and the plan will be to continue that for 1 year.  She normally has her mammography in the summer and sees her surgeon in the fall.  She will see [place] in 1 year  She knows to call for any other issues that may develop before that  Total encounter time 20 minutes.* Chauncey CruelMD   06/11/2020 9:40 AM Medical Oncology and H[facility] [address]. [phone]  Fax. [phone] This document serves as a record of services personally performed by GuLurline DelMD. It was created on his behalf by KaWilburn Mylara trained medical scribe. The creation of this record is based on the scribe's personal observations and the provider's statements to them.   I, GuLurline DelD, have reviewed the above documentation for accuracy and completeness, and I agree with the above.   *Total Encounter Time as defined by the Centers for Medicare and Medicaid Services includes, in addition to the face-to-face time of a patient visit (documented in the note above) non-face-to-face time: obtaining and reviewing outside history, ordering and reviewing medications, tests or procedures, care coordination (communications with other health care professionals or caregivers) and documentation in the medical record.

## 2020-06-11 ENCOUNTER — Other Ambulatory Visit: Payer: Self-pay

## 2020-06-11 ENCOUNTER — Inpatient Hospital Stay: Payer: 59

## 2020-06-11 ENCOUNTER — Inpatient Hospital Stay: Payer: 59 | Attending: Oncology | Admitting: Oncology

## 2020-06-11 VITALS — BP 167/89 | HR 108 | Temp 97.8°F | Resp 18 | Ht 61.0 in | Wt 173.8 lb

## 2020-06-11 DIAGNOSIS — C50211 Malignant neoplasm of upper-inner quadrant of right female breast: Secondary | ICD-10-CM | POA: Diagnosis not present

## 2020-06-11 DIAGNOSIS — C50911 Malignant neoplasm of unspecified site of right female breast: Secondary | ICD-10-CM | POA: Diagnosis not present

## 2020-06-11 DIAGNOSIS — Z923 Personal history of irradiation: Secondary | ICD-10-CM | POA: Diagnosis not present

## 2020-06-11 DIAGNOSIS — Z79899 Other long term (current) drug therapy: Secondary | ICD-10-CM | POA: Diagnosis not present

## 2020-06-11 DIAGNOSIS — Z170 Estrogen receptor positive status [ER+]: Secondary | ICD-10-CM

## 2020-06-11 DIAGNOSIS — I10 Essential (primary) hypertension: Secondary | ICD-10-CM | POA: Diagnosis not present

## 2020-06-11 DIAGNOSIS — Z79810 Long term (current) use of selective estrogen receptor modulators (SERMs): Secondary | ICD-10-CM | POA: Diagnosis not present

## 2020-06-11 LAB — COMPREHENSIVE METABOLIC PANEL
ALT: 44 U/L (ref 0–44)
AST: 49 U/L — ABNORMAL HIGH (ref 15–41)
Albumin: 3.6 g/dL (ref 3.5–5.0)
Alkaline Phosphatase: 84 U/L (ref 38–126)
Anion gap: 13 (ref 5–15)
BUN: 16 mg/dL (ref 6–20)
CO2: 19 mmol/L — ABNORMAL LOW (ref 22–32)
Calcium: 8.4 mg/dL — ABNORMAL LOW (ref 8.9–10.3)
Chloride: 109 mmol/L (ref 98–111)
Creatinine, Ser: 1.09 mg/dL — ABNORMAL HIGH (ref 0.44–1.00)
GFR, Estimated: 60 mL/min — ABNORMAL LOW (ref >60)
Glucose, Bld: 108 mg/dL — ABNORMAL HIGH (ref 70–99)
Potassium: 3.8 mmol/L (ref 3.5–5.1)
Sodium: 141 mmol/L (ref 135–145)
Total Bilirubin: 0.5 mg/dL (ref 0.3–1.2)
Total Protein: 6.9 g/dL (ref 6.5–8.1)

## 2020-06-11 LAB — CBC WITH DIFFERENTIAL/PLATELET
Abs Immature Granulocytes: 0.01 10*3/uL (ref 0.00–0.07)
Basophils Absolute: 0.1 10*3/uL (ref 0.0–0.1)
Basophils Relative: 1 %
Eosinophils Absolute: 0.3 10*3/uL (ref 0.0–0.5)
Eosinophils Relative: 5 %
HCT: 41.5 % (ref 36.0–46.0)
Hemoglobin: 13.5 g/dL (ref 12.0–15.0)
Immature Granulocytes: 0 %
Lymphocytes Relative: 27 %
Lymphs Abs: 1.5 10*3/uL (ref 0.7–4.0)
MCH: 28.4 pg (ref 26.0–34.0)
MCHC: 32.5 g/dL (ref 30.0–36.0)
MCV: 87.4 fL (ref 80.0–100.0)
Monocytes Absolute: 0.5 10*3/uL (ref 0.1–1.0)
Monocytes Relative: 9 %
Neutro Abs: 3.1 10*3/uL (ref 1.7–7.7)
Neutrophils Relative %: 58 %
Platelets: 231 10*3/uL (ref 150–400)
RBC: 4.75 MIL/uL (ref 3.87–5.11)
RDW: 13.2 % (ref 11.5–15.5)
WBC: 5.4 10*3/uL (ref 4.0–10.5)
nRBC: 0 % (ref 0.0–0.2)

## 2020-06-12 ENCOUNTER — Telehealth: Payer: Self-pay | Admitting: Oncology

## 2020-06-12 NOTE — Telephone Encounter (Signed)
Scheduled per 4/5 los. Called and spoke with pt confirmed 4/6 appts

## 2020-06-19 ENCOUNTER — Encounter: Payer: Self-pay | Admitting: Family Medicine

## 2020-07-11 ENCOUNTER — Encounter: Payer: Self-pay | Admitting: Family Medicine

## 2020-07-25 ENCOUNTER — Other Ambulatory Visit (HOSPITAL_BASED_OUTPATIENT_CLINIC_OR_DEPARTMENT_OTHER): Payer: Self-pay

## 2020-07-25 ENCOUNTER — Other Ambulatory Visit: Payer: Self-pay | Admitting: Family Medicine

## 2020-07-25 DIAGNOSIS — I10 Essential (primary) hypertension: Secondary | ICD-10-CM

## 2020-07-25 MED ORDER — LOSARTAN POTASSIUM-HCTZ 50-12.5 MG PO TABS
1.0000 | ORAL_TABLET | Freq: Every day | ORAL | 0 refills | Status: DC
  Filled 2020-07-25: qty 90, 90d supply, fill #0

## 2020-07-25 MED FILL — Tamoxifen Citrate Tab 20 MG (Base Equivalent): ORAL | 90 days supply | Qty: 90 | Fill #0 | Status: AC

## 2020-07-29 ENCOUNTER — Other Ambulatory Visit (HOSPITAL_BASED_OUTPATIENT_CLINIC_OR_DEPARTMENT_OTHER): Payer: Self-pay

## 2020-08-16 ENCOUNTER — Other Ambulatory Visit: Payer: Self-pay

## 2020-08-16 ENCOUNTER — Encounter: Payer: Self-pay | Admitting: Family Medicine

## 2020-08-16 ENCOUNTER — Other Ambulatory Visit (HOSPITAL_BASED_OUTPATIENT_CLINIC_OR_DEPARTMENT_OTHER): Payer: Self-pay

## 2020-08-16 ENCOUNTER — Ambulatory Visit: Payer: 59 | Admitting: Family Medicine

## 2020-08-16 VITALS — BP 136/88 | HR 95 | Ht 61.0 in | Wt 172.0 lb

## 2020-08-16 DIAGNOSIS — G4733 Obstructive sleep apnea (adult) (pediatric): Secondary | ICD-10-CM | POA: Diagnosis not present

## 2020-08-16 DIAGNOSIS — R600 Localized edema: Secondary | ICD-10-CM

## 2020-08-16 DIAGNOSIS — I10 Essential (primary) hypertension: Secondary | ICD-10-CM | POA: Diagnosis not present

## 2020-08-16 MED ORDER — LOSARTAN POTASSIUM-HCTZ 100-25 MG PO TABS
1.0000 | ORAL_TABLET | Freq: Every day | ORAL | 1 refills | Status: DC
  Filled 2020-08-16: qty 90, 90d supply, fill #0
  Filled 2020-10-30: qty 90, 90d supply, fill #1

## 2020-08-16 NOTE — Progress Notes (Signed)
Established Patient Office Visit  Subjective:  Patient ID: [name], female    DOB: 07-02-1965  Age: 55 y.o. MRN: [id number]  CC:  Chief Complaint  Patient presents with   Follow-up   Foot Swelling    HPI [name] presents for f/u swellling in her feet.  She had sent a mycart note in May about the swelling.  She says it is better than it was but still occurring she said she really started drinking more water and felt like that actually helped.  She says it usually looks pretty good in the mornings and looks a little bit more swollen by the end of the day.  No recent chest pain or shortness of breath.  She just had renal and liver function done a couple months ago.  Hypertension- Pt denies chest pain, SOB, dizziness, or heart palpitations.  Taking meds as directed w/o problems.  Denies medication side effects.     Past Medical History:  Diagnosis Date   Cancer ([place])    Eczema    legs   Family history of brain cancer    Family history of breast cancer    Family history of lung cancer    Family history of ovarian cancer    Hallux valgus 06/2012   left   Hammertoe 06/2012   left second   Heart murmur    since birth, has had no testing, no problems   History of frequent ear infections    Hypertension    under control with med., has been on med. x 3 yr.   Irritable bowel syndrome (IBS)    no current med.   Personal history of radiation therapy    Sleep apnea    Wears hearing aid    bilateral    Past Surgical History:  Procedure Laterality Date   BREAST BIOPSY Right 12/22/2018   BREAST LUMPECTOMY Right 01/19/2019   BREAST LUMPECTOMY WITH RADIOACTIVE SEED AND SENTINEL LYMPH NODE BIOPSY Right 01/19/2019   Procedure: RIGHT BREAST LUMPECTOMY WITH RADIOACTIVE SEED AND SENTINEL LYMPH NODE BIOPSY;  Surgeon: [name], MD;  Location: [place];  Service: General;  Laterality: Right;   EXPLORATORY TYMPANOTOMY Left 06/25/2000   with lysis of adhesions    HAMMER TOE SURGERY Left 06/16/2012   Procedure: [place];  Surgeon: Wylene Simmer, MD;  Location: [place];  Service: Orthopedics;  Laterality: Left;   MASTOPEXY Left 03/19/2020   Procedure: MASTOPEXY;  Surgeon: Irene Limbo, MD;  Location: [place];  Service: Plastics;  Laterality: Left;   TONSILLECTOMY     TYMPANOPLASTY Left 06/25/2000   TYMPANOSTOMY TUBE PLACEMENT  10/10/2009    Family History  Problem Relation Age of Onset   Alzheimer's disease Mother    Brain cancer Father 60       Glioblastoma multiforma.    Ovarian cancer Paternal Aunt        dx. 91s or 30s   Lung cancer Other        dx. 75s (maternal great-aunt)    Social History   Socioeconomic History   Marital status: Married    Spouse name: Not on file   Number of children: 2    Years of education: Not on file   Highest education level: Not on file  Occupational History   Occupation: works from home   Tobacco Use   Smoking status: Never   Smokeless tobacco: Never  Vaping Use  Vaping Use: Never used  Substance and Sexual Activity   Alcohol use: Not Currently    Comment: occasionally   Drug use: No   Sexual activity: Not on file  Other Topics Concern   Not on file  Social History Narrative   No regular exercise.     Social Determinants of Health   Financial Resource Strain: Not on file  Food Insecurity: Not on file  Transportation Needs: Not on file  Physical Activity: Not on file  Stress: Not on file  Social Connections: Not on file  Intimate Partner Violence: Not on file    Outpatient Medications Prior to Visit  Medication Sig Dispense Refill   tamoxifen (NOLVADEX) 20 MG tablet TAKE 1 TABLET BY MOUTH ONCE DAILY 90 tablet 12   losartan-hydrochlorothiazide (HYZAAR) 50-12.5 MG tablet Take 1 tablet by mouth daily. appt for further refills 90 tablet 0   oxyCODONE (OXY IR/ROXICODONE) 5 MG immediate release tablet TAKE 1  TABLET BY MOUTH EVERY 8 HOURS AS NEEDED 20 tablet 0   No facility-administered medications prior to visit.    Allergies  Allergen Reactions   Erythromycin Nausea And Vomiting    FACIAL NUMBNESS   Nitrofurantoin Other (See Comments)    GI UPSET   Lexapro [Escitalopram Oxalate] Nausea Only   Lisinopril Cough    ROS Review of Systems    Objective:    Physical Exam Constitutional:      Appearance: She is well-developed.  HENT:     Head: Normocephalic and atraumatic.  Cardiovascular:     Rate and Rhythm: Normal rate and regular rhythm.     Heart sounds: Normal heart sounds.  Pulmonary:     Effort: Pulmonary effort is normal.     Breath sounds: Normal breath sounds.  Skin:    General: Skin is warm and dry.  Neurological:     Mental Status: She is alert and oriented to person, place, and time.  Psychiatric:        Behavior: Behavior normal.    BP 136/88   Pulse 95   Ht 5\' 1"  (1.549 m)   Wt 172 lb (78 kg)   SpO2 98%   BMI 32.50 kg/m  Wt Readings from Last 3 Encounters:  08/16/20 172 lb (78 kg)  06/11/20 173 lb 12.8 oz (78.8 kg)  03/19/20 173 lb 1 oz (78.5 kg)     Health Maintenance Due  Topic Date Due   Hepatitis C Screening  Never done    There are no preventive care reminders to display for this patient.  Lab Results  Component Value Date   TSH 2.238 07/27/2007   Lab Results  Component Value Date   WBC 5.4 06/11/2020   HGB 13.5 06/11/2020   HCT 41.5 06/11/2020   MCV 87.4 06/11/2020   PLT 231 06/11/2020   Lab Results  Component Value Date   NA 141 06/11/2020   K 3.8 06/11/2020   CO2 19 (L) 06/11/2020   GLUCOSE 108 (H) 06/11/2020   BUN 16 06/11/2020   CREATININE 1.09 (H) 06/11/2020   BILITOT 0.5 06/11/2020   ALKPHOS 84 06/11/2020   AST 49 (H) 06/11/2020   ALT 44 06/11/2020   PROT 6.9 06/11/2020   ALBUMIN 3.6 06/11/2020   CALCIUM 8.4 (L) 06/11/2020   ANIONGAP 13 06/11/2020   Lab Results  Component Value Date   CHOL 161 02/19/2020    Lab Results  Component Value Date   HDL 40 (L) 02/19/2020   Lab Results  Component  Value Date   LDLCALC 87 02/19/2020   Lab Results  Component Value Date   TRIG 262 (H) 02/19/2020   Lab Results  Component Value Date   CHOLHDL 4.0 02/19/2020   Lab Results  Component Value Date   HGBA1C 5.5 04/01/2015      Assessment & Plan:   Problem List Items Addressed This Visit       Cardiovascular and Mediastinum   Essential hypertension - Primary    Pressure not at goal today.  Though I am excited she is started exercising regularly I think that will make a big difference.  We also discussed cutting back on carbs and continuing to work on a low-salt diet.  In the meantime we will get a go ahead and adjust her Hyzaar this may also help with some of her swelling I discussed how controlling her blood pressure can actually help reduce swelling.  Plan to repeat BMP in 1 week and recheck blood pressure in 2 weeks.       Relevant Medications   losartan-hydrochlorothiazide (HYZAAR) 100-25 MG tablet   Other Relevant Orders   BASIC METABOLIC PANEL WITH GFR   TSH   Other Visit Diagnoses     Lower extremity edema       Relevant Orders   BASIC METABOLIC PANEL WITH GFR   TSH        Lower extremity edema-discussed options.  I suspect is likely related to some venous stasis.  She does have some compression stockings at home she just has not worn them lately because of the summertime.  Increasing her water intake has helped.  She is also started working out at the gym over the last month and feels like that has helped a little bit as well.  We discussed that weight loss would help.  In the meantime we could certainly work on adjusting the hydrochlorothiazide and her blood pressure pill to see if that would help.  We will just need to recheck a BMP in about a week after she makes the adjustment.   He says she plans on getting the shingles vaccine at the pharmacy.  Meds ordered this  encounter  Medications   losartan-hydrochlorothiazide (HYZAAR) 100-25 MG tablet    Sig: Take 1 tablet by mouth daily.    Dispense:  90 tablet    Refill:  1    Follow-up: Return in about 6 months (around 02/15/2021) for Hypertension.    Beatrice Lecher, MD

## 2020-08-16 NOTE — Patient Instructions (Signed)
Schedule nurse visit follow-up in 2 weeks after we adjust her blood pressure pill.  He will also need to go to the lab in 1 to 2 weeks to recheck your potassium since I am adjusting your diuretic.

## 2020-08-16 NOTE — Assessment & Plan Note (Signed)
Pressure not at goal today.  Though I am excited she is started exercising regularly I think that will make a big difference.  We also discussed cutting back on carbs and continuing to work on a low-salt diet.  In the meantime we will get a go ahead and adjust her Hyzaar this may also help with some of her swelling I discussed how controlling her blood pressure can actually help reduce swelling.  Plan to repeat BMP in 1 week and recheck blood pressure in 2 weeks.

## 2020-08-19 ENCOUNTER — Ambulatory Visit: Payer: 59 | Admitting: Family Medicine

## 2020-09-07 IMAGING — MG MM BREAST SURGICAL SPECIMEN
1 series · 1 of 1 positions shown · non-contrast
Comparison: Previous exam(s).

CLINICAL DATA: Right breast cancer status post lumpectomy

EXAM:
SPECIMEN RADIOGRAPH OF THE right BREAST

[R]
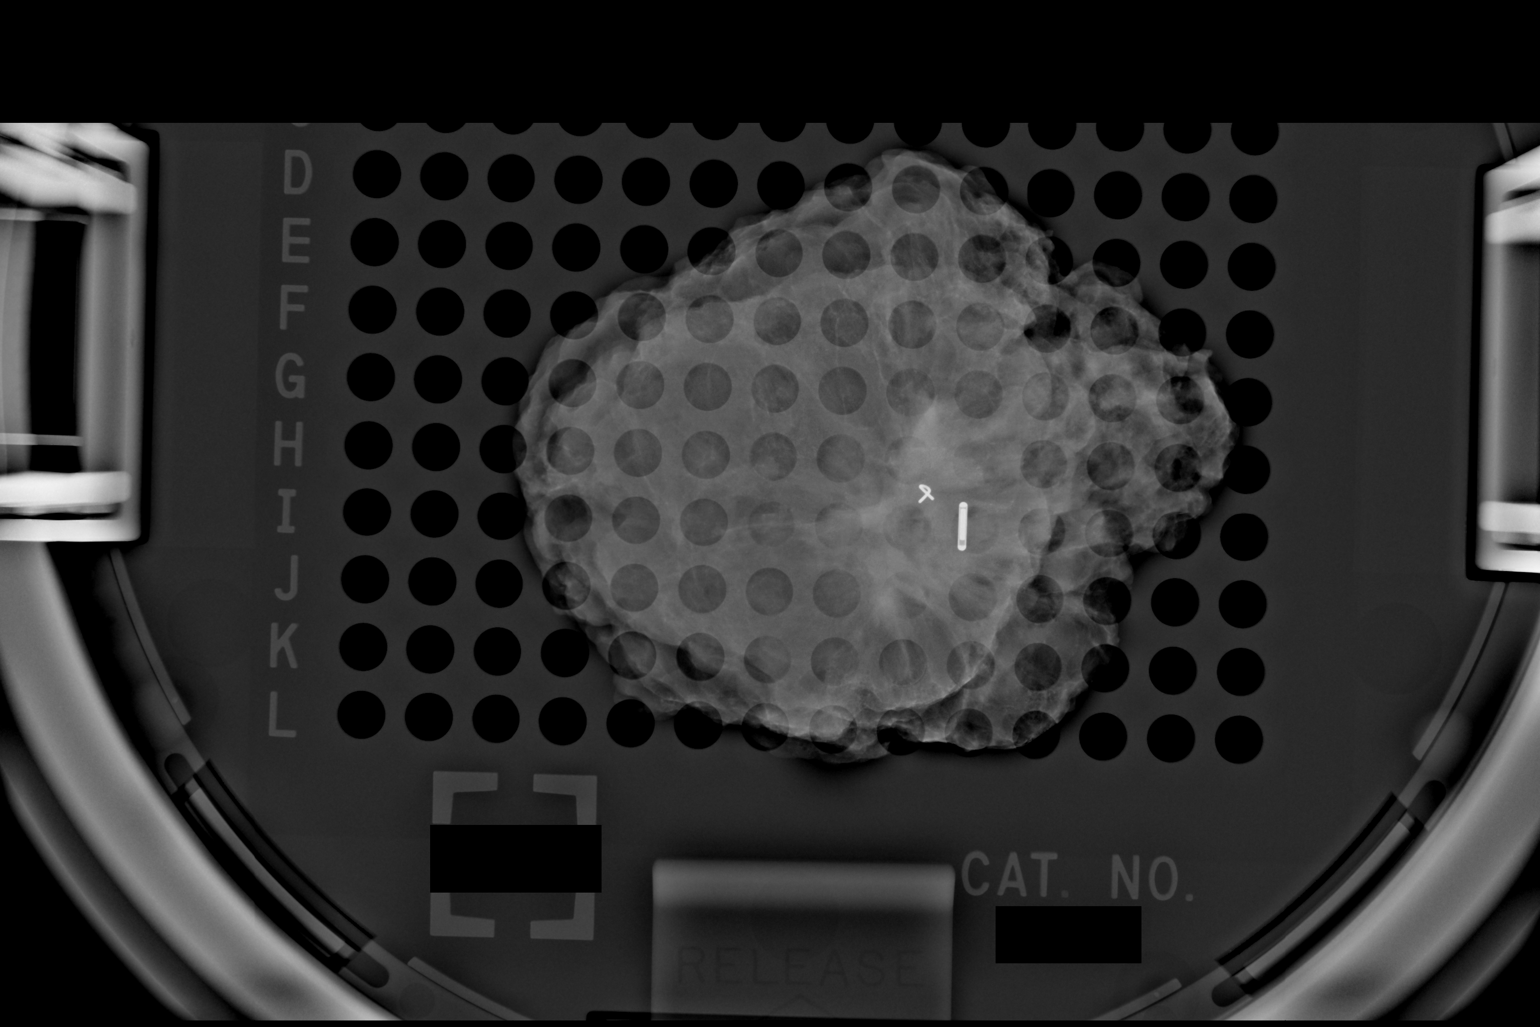

[1 of 1 positions shown; findings below may reference images not displayed]

FINDINGS: Status post excision of the right breast. The radioactive seed and
biopsy marker clip are present, completely intact, and were marked
for pathology.
IMPRESSION: Specimen radiograph of the right breast.

## 2020-10-30 ENCOUNTER — Other Ambulatory Visit (HOSPITAL_BASED_OUTPATIENT_CLINIC_OR_DEPARTMENT_OTHER): Payer: Self-pay

## 2020-10-30 MED FILL — Tamoxifen Citrate Tab 20 MG (Base Equivalent): ORAL | 90 days supply | Qty: 90 | Fill #1 | Status: AC

## 2020-10-31 ENCOUNTER — Other Ambulatory Visit (HOSPITAL_BASED_OUTPATIENT_CLINIC_OR_DEPARTMENT_OTHER): Payer: Self-pay

## 2020-11-14 ENCOUNTER — Other Ambulatory Visit: Payer: Self-pay | Admitting: Oncology

## 2020-11-14 DIAGNOSIS — Z9889 Other specified postprocedural states: Secondary | ICD-10-CM

## 2020-11-18 DIAGNOSIS — G4733 Obstructive sleep apnea (adult) (pediatric): Secondary | ICD-10-CM | POA: Diagnosis not present

## 2020-11-28 ENCOUNTER — Other Ambulatory Visit: Payer: Self-pay

## 2020-11-28 ENCOUNTER — Ambulatory Visit
Admission: RE | Admit: 2020-11-28 | Discharge: 2020-11-28 | Disposition: A | Discharge status: Home / Self Care | Payer: 59 | Source: Ambulatory Visit | Attending: Oncology | Admitting: Oncology

## 2020-11-28 DIAGNOSIS — R922 Inconclusive mammogram: Secondary | ICD-10-CM | POA: Diagnosis not present

## 2020-11-28 DIAGNOSIS — Z9889 Other specified postprocedural states: Secondary | ICD-10-CM

## 2021-02-03 ENCOUNTER — Other Ambulatory Visit: Payer: Self-pay | Admitting: Family Medicine

## 2021-02-03 ENCOUNTER — Other Ambulatory Visit (HOSPITAL_BASED_OUTPATIENT_CLINIC_OR_DEPARTMENT_OTHER): Payer: Self-pay

## 2021-02-03 DIAGNOSIS — I10 Essential (primary) hypertension: Secondary | ICD-10-CM

## 2021-02-03 MED FILL — Tamoxifen Citrate Tab 20 MG (Base Equivalent): ORAL | 90 days supply | Qty: 90 | Fill #2 | Status: AC

## 2021-02-04 ENCOUNTER — Other Ambulatory Visit (HOSPITAL_BASED_OUTPATIENT_CLINIC_OR_DEPARTMENT_OTHER): Payer: Self-pay

## 2021-02-05 ENCOUNTER — Other Ambulatory Visit (HOSPITAL_BASED_OUTPATIENT_CLINIC_OR_DEPARTMENT_OTHER): Payer: Self-pay

## 2021-02-05 MED ORDER — LOSARTAN POTASSIUM-HCTZ 100-25 MG PO TABS
1.0000 | ORAL_TABLET | Freq: Every day | ORAL | 1 refills | Status: DC
  Filled 2021-02-05 (×2): qty 90, 90d supply, fill #0
  Filled 2021-05-05: qty 90, 90d supply, fill #1

## 2021-02-06 ENCOUNTER — Other Ambulatory Visit (HOSPITAL_BASED_OUTPATIENT_CLINIC_OR_DEPARTMENT_OTHER): Payer: Self-pay

## 2021-02-10 DIAGNOSIS — Z01419 Encounter for gynecological examination (general) (routine) without abnormal findings: Secondary | ICD-10-CM | POA: Diagnosis not present

## 2021-02-10 DIAGNOSIS — N912 Amenorrhea, unspecified: Secondary | ICD-10-CM | POA: Diagnosis not present

## 2021-02-10 DIAGNOSIS — Z6833 Body mass index (BMI) 33.0-33.9, adult: Secondary | ICD-10-CM | POA: Diagnosis not present

## 2021-02-17 ENCOUNTER — Encounter: Payer: Self-pay | Admitting: Family Medicine

## 2021-02-17 ENCOUNTER — Ambulatory Visit: Payer: 59 | Admitting: Family Medicine

## 2021-02-17 ENCOUNTER — Other Ambulatory Visit: Payer: Self-pay

## 2021-02-17 VITALS — BP 120/69 | HR 88 | Temp 98.2°F | Resp 16 | Ht 61.0 in | Wt 173.0 lb

## 2021-02-17 DIAGNOSIS — E781 Pure hyperglyceridemia: Secondary | ICD-10-CM | POA: Diagnosis not present

## 2021-02-17 DIAGNOSIS — I10 Essential (primary) hypertension: Secondary | ICD-10-CM | POA: Diagnosis not present

## 2021-02-17 DIAGNOSIS — G4733 Obstructive sleep apnea (adult) (pediatric): Secondary | ICD-10-CM | POA: Diagnosis not present

## 2021-02-17 LAB — COMPLETE METABOLIC PANEL WITH GFR
AG Ratio: 1.3 (calc) (ref 1.0–2.5)
ALT: 45 U/L — ABNORMAL HIGH (ref 6–29)
AST: 53 U/L — ABNORMAL HIGH (ref 10–35)
Albumin: 4.2 g/dL (ref 3.6–5.1)
Alkaline phosphatase (APISO): 92 U/L (ref 37–153)
BUN/Creatinine Ratio: 18 (calc) (ref 6–22)
BUN: 19 mg/dL (ref 7–25)
CO2: 25 mmol/L (ref 20–32)
Calcium: 9.1 mg/dL (ref 8.6–10.4)
Chloride: 106 mmol/L (ref 98–110)
Creat: 1.05 mg/dL — ABNORMAL HIGH (ref 0.50–1.03)
Globulin: 3.2 g/dL (calc) (ref 1.9–3.7)
Glucose, Bld: 108 mg/dL — ABNORMAL HIGH (ref 65–99)
Potassium: 3.8 mmol/L (ref 3.5–5.3)
Sodium: 142 mmol/L (ref 135–146)
Total Bilirubin: 0.5 mg/dL (ref 0.2–1.2)
Total Protein: 7.4 g/dL (ref 6.1–8.1)
eGFR: 62 mL/min/{1.73_m2} (ref >=60)

## 2021-02-17 LAB — LIPID PANEL
Cholesterol: 207 mg/dL — ABNORMAL HIGH (ref <200)
HDL: 45 mg/dL — ABNORMAL LOW (ref >=50)
LDL Cholesterol (Calc): 130 mg/dL (calc) — ABNORMAL HIGH
Non-HDL Cholesterol (Calc): 162 mg/dL (calc) — ABNORMAL HIGH (ref <130)
Total CHOL/HDL Ratio: 4.6 (calc) (ref <5.0)
Triglycerides: 187 mg/dL — ABNORMAL HIGH (ref <150)

## 2021-02-17 LAB — TSH: TSH: 3.61 mIU/L (ref 0.40–4.50)

## 2021-02-17 NOTE — Progress Notes (Signed)
Hi [name], LDL cholesterol jumped up.  It was 87 last year and is now up to 130.  Just really encourage you to continue to work on healthy diet and regular exercise to improve your numbers.  Triglycerides actually look a little bit better interestingly enough.  Kidney function is stable at 1.0.  Your calcium looks good this time.  Liver enzymes are mildly elevated, similar to about a year ago.  Your thyroid is still pending.

## 2021-02-17 NOTE — Progress Notes (Signed)
Established Patient Office Visit  Subjective:  Patient ID: [name], female    DOB: 1965/05/01  Age: 55 y.o. MRN: [id number]  CC:  Chief Complaint  Patient presents with   Hypertension    Follow up     HPI [name] presents for 6 mo f/u  Hypertension- Pt denies chest pain, SOB, dizziness, or heart palpitations.  Taking meds as directed w/o problems.  Denies medication side effects.  Increased her HCTZ to 25 mg at last visit 6 months ago.  She is tolerating it well and says it has been helping her swelling.  She is planning on getting the shingles vaccine in the new year.   Past Medical History:  Diagnosis Date   Cancer ([place])    Eczema    legs   Family history of brain cancer    Family history of breast cancer    Family history of lung cancer    Family history of ovarian cancer    Hallux valgus 06/2012   left   Hammertoe 06/2012   left second   Heart murmur    since birth, has had no testing, no problems   History of frequent ear infections    Hypertension    under control with med., has been on med. x 3 yr.   Irritable bowel syndrome (IBS)    no current med.   Personal history of radiation therapy    Sleep apnea    Wears hearing aid    bilateral    Past Surgical History:  Procedure Laterality Date   BREAST BIOPSY Right 12/22/2018   BREAST LUMPECTOMY Right 01/19/2019   BREAST LUMPECTOMY WITH RADIOACTIVE SEED AND SENTINEL LYMPH NODE BIOPSY Right 01/19/2019   Procedure: RIGHT BREAST LUMPECTOMY WITH RADIOACTIVE SEED AND SENTINEL LYMPH NODE BIOPSY;  Surgeon: [name], MD;  Location: [place];  Service: General;  Laterality: Right;   EXPLORATORY TYMPANOTOMY Left 06/25/2000   with lysis of adhesions   HAMMER TOE SURGERY Left 06/16/2012   Procedure: [place];  Surgeon: Wylene Simmer, MD;  Location: [place];  Service: Orthopedics;  Laterality: Left;   MASTOPEXY Left 03/19/2020    Procedure: MASTOPEXY;  Surgeon: Irene Limbo, MD;  Location: [place];  Service: Plastics;  Laterality: Left;   TONSILLECTOMY     TYMPANOPLASTY Left 06/25/2000   TYMPANOSTOMY TUBE PLACEMENT  10/10/2009    Family History  Problem Relation Age of Onset   Alzheimer's disease Mother    Brain cancer Father 36       Glioblastoma multiforma.    Ovarian cancer Paternal Aunt        dx. 85s or 39s   Lung cancer Other        dx. 68s (maternal great-aunt)    Social History   Socioeconomic History   Marital status: Married    Spouse name: Not on file   Number of children: 2    Years of education: Not on file   Highest education level: Not on file  Occupational History   Occupation: works from home   Tobacco Use   Smoking status: Never   Smokeless tobacco: Never  Vaping Use   Vaping Use: Never used  Substance and Sexual Activity   Alcohol use: Not Currently    Comment: occasionally   Drug use: No   Sexual activity: Not on file  Other Topics Concern   Not on file  Social History Narrative  No regular exercise.     Social Determinants of Health   Financial Resource Strain: Not on file  Food Insecurity: Not on file  Transportation Needs: Not on file  Physical Activity: Not on file  Stress: Not on file  Social Connections: Not on file  Intimate Partner Violence: Not on file    Outpatient Medications Prior to Visit  Medication Sig Dispense Refill   losartan-hydrochlorothiazide (HYZAAR) 100-25 MG tablet Take 1 tablet by mouth daily. 90 tablet 1   tamoxifen (NOLVADEX) 20 MG tablet TAKE 1 TABLET BY MOUTH ONCE DAILY 90 tablet 12   medroxyPROGESTERone (PROVERA) 10 MG tablet Provera 10 mg tablet  take one po qd for 10 days prn every 2 months if no menses (Patient not taking: Reported on 02/17/2021)     No facility-administered medications prior to visit.    Allergies  Allergen Reactions   Erythromycin Nausea And Vomiting    FACIAL NUMBNESS    Nitrofurantoin Other (See Comments)    GI UPSET   Lexapro [Escitalopram Oxalate] Nausea Only   Lisinopril Cough    ROS Review of Systems    Objective:    Physical Exam Constitutional:      Appearance: Normal appearance. She is well-developed.  HENT:     Head: Normocephalic and atraumatic.  Cardiovascular:     Rate and Rhythm: Normal rate and regular rhythm.     Heart sounds: Normal heart sounds.  Pulmonary:     Effort: Pulmonary effort is normal.     Breath sounds: Normal breath sounds.  Skin:    General: Skin is warm and dry.  Neurological:     Mental Status: She is alert and oriented to person, place, and time.  Psychiatric:        Behavior: Behavior normal.    BP 120/69   Pulse 88   Temp 98.2 F (36.8 C)   Resp 16   Ht 5\' 1"  (1.549 m)   Wt 173 lb (78.5 kg)   SpO2 100%   BMI 32.69 kg/m  Wt Readings from Last 3 Encounters:  02/17/21 173 lb (78.5 kg)  08/16/20 172 lb (78 kg)  06/11/20 173 lb 12.8 oz (78.8 kg)     Health Maintenance Due  Topic Date Due   Hepatitis C Screening  Never done    There are no preventive care reminders to display for this patient.  Lab Results  Component Value Date   TSH 2.238 07/27/2007   Lab Results  Component Value Date   WBC 5.4 06/11/2020   HGB 13.5 06/11/2020   HCT 41.5 06/11/2020   MCV 87.4 06/11/2020   PLT 231 06/11/2020   Lab Results  Component Value Date   NA 141 06/11/2020   K 3.8 06/11/2020   CO2 19 (L) 06/11/2020   GLUCOSE 108 (H) 06/11/2020   BUN 16 06/11/2020   CREATININE 1.09 (H) 06/11/2020   BILITOT 0.5 06/11/2020   ALKPHOS 84 06/11/2020   AST 49 (H) 06/11/2020   ALT 44 06/11/2020   PROT 6.9 06/11/2020   ALBUMIN 3.6 06/11/2020   CALCIUM 8.4 (L) 06/11/2020   ANIONGAP 13 06/11/2020   Lab Results  Component Value Date   CHOL 161 02/19/2020   Lab Results  Component Value Date   HDL 40 (L) 02/19/2020   Lab Results  Component Value Date   LDLCALC 87 02/19/2020   Lab Results  Component  Value Date   TRIG 262 (H) 02/19/2020   Lab Results  Component Value Date  CHOLHDL 4.0 02/19/2020   Lab Results  Component Value Date   HGBA1C 5.5 04/01/2015      Assessment & Plan:   Problem List Items Addressed This Visit       Cardiovascular and Mediastinum   Essential hypertension - Primary    Well controlled. Continue current regimen. Follow up in  6 mo . Due for labs.        Relevant Orders   TSH   Lipid panel   COMPLETE METABOLIC PANEL WITH GFR     Other   Hypertriglyceridemia    Due for labs.        Relevant Orders   TSH   Lipid panel   COMPLETE METABOLIC PANEL WITH GFR    No orders of the defined types were placed in this encounter.   Follow-up: Return in about 27 weeks (around 08/25/2021).    Beatrice Lecher, MD

## 2021-02-17 NOTE — Assessment & Plan Note (Signed)
Well controlled. Continue current regimen. Follow up in  6 mo. Due for labs.  

## 2021-02-17 NOTE — Assessment & Plan Note (Signed)
Due for labs

## 2021-02-18 NOTE — Progress Notes (Signed)
Hi [name], your thyroid looks good.  Lets plan to recheck your cholesterol and liver in about 6 months just to make sure, that it is getting back on track.

## 2021-02-19 ENCOUNTER — Other Ambulatory Visit (HOSPITAL_BASED_OUTPATIENT_CLINIC_OR_DEPARTMENT_OTHER): Payer: Self-pay

## 2021-03-07 ENCOUNTER — Other Ambulatory Visit (HOSPITAL_BASED_OUTPATIENT_CLINIC_OR_DEPARTMENT_OTHER): Payer: Self-pay

## 2021-03-07 MED ORDER — MEDROXYPROGESTERONE ACETATE 10 MG PO TABS
ORAL_TABLET | ORAL | 6 refills | Status: DC
  Filled 2021-03-07: qty 10, 56d supply, fill #0

## 2021-03-25 ENCOUNTER — Encounter: Payer: Self-pay | Admitting: Family Medicine

## 2021-03-25 MED ORDER — FLUOXETINE HCL 10 MG PO CAPS: 20.0000 mg | ORAL_CAPSULE | Freq: Every day | ORAL | 1 refills | Status: DC

## 2021-03-25 NOTE — Telephone Encounter (Signed)
Meds sent.  Please have her schedule a follow-up with me in about 3 to 4 weeks so we can make sure that it is working well and make sure that we do not need to make any adjustments.

## 2021-03-26 ENCOUNTER — Other Ambulatory Visit (HOSPITAL_BASED_OUTPATIENT_CLINIC_OR_DEPARTMENT_OTHER): Payer: Self-pay

## 2021-03-26 MED ORDER — FLUOXETINE HCL 10 MG PO CAPS
20.0000 mg | ORAL_CAPSULE | Freq: Every day | ORAL | 1 refills | Status: DC
  Filled 2021-03-26 – 2021-03-28 (×2): qty 60, 30d supply, fill #0
  Filled 2021-06-01: qty 60, 30d supply, fill #1

## 2021-03-28 ENCOUNTER — Other Ambulatory Visit (HOSPITAL_BASED_OUTPATIENT_CLINIC_OR_DEPARTMENT_OTHER): Payer: Self-pay

## 2021-05-05 ENCOUNTER — Other Ambulatory Visit (HOSPITAL_BASED_OUTPATIENT_CLINIC_OR_DEPARTMENT_OTHER): Payer: Self-pay

## 2021-05-05 ENCOUNTER — Other Ambulatory Visit: Payer: Self-pay

## 2021-05-07 ENCOUNTER — Other Ambulatory Visit: Payer: Self-pay | Admitting: Oncology

## 2021-05-07 ENCOUNTER — Other Ambulatory Visit (HOSPITAL_BASED_OUTPATIENT_CLINIC_OR_DEPARTMENT_OTHER): Payer: Self-pay

## 2021-05-08 ENCOUNTER — Encounter: Payer: Self-pay | Admitting: Hematology and Oncology

## 2021-05-08 ENCOUNTER — Other Ambulatory Visit: Payer: Self-pay

## 2021-05-08 ENCOUNTER — Other Ambulatory Visit (HOSPITAL_BASED_OUTPATIENT_CLINIC_OR_DEPARTMENT_OTHER): Payer: Self-pay

## 2021-05-08 MED ORDER — TAMOXIFEN CITRATE 20 MG PO TABS
20.0000 mg | ORAL_TABLET | Freq: Every day | ORAL | 0 refills | Status: DC
  Filled 2021-05-08: qty 30, 30d supply, fill #0

## 2021-05-09 ENCOUNTER — Other Ambulatory Visit (HOSPITAL_BASED_OUTPATIENT_CLINIC_OR_DEPARTMENT_OTHER): Payer: Self-pay

## 2021-05-09 NOTE — Telephone Encounter (Signed)
This was signed yesterday by Dr. Chryl Heck. Gardiner Rhyme, RN ? ?

## 2021-05-12 ENCOUNTER — Other Ambulatory Visit: Payer: Self-pay | Admitting: *Deleted

## 2021-05-12 DIAGNOSIS — G4733 Obstructive sleep apnea (adult) (pediatric): Secondary | ICD-10-CM

## 2021-05-12 MED ORDER — AMBULATORY NON FORMULARY MEDICATION: 99 refills | Status: AC

## 2021-05-19 DIAGNOSIS — G4733 Obstructive sleep apnea (adult) (pediatric): Secondary | ICD-10-CM | POA: Diagnosis not present

## 2021-06-02 ENCOUNTER — Other Ambulatory Visit (HOSPITAL_BASED_OUTPATIENT_CLINIC_OR_DEPARTMENT_OTHER): Payer: Self-pay

## 2021-06-11 ENCOUNTER — Other Ambulatory Visit: Payer: Self-pay

## 2021-06-11 DIAGNOSIS — C50211 Malignant neoplasm of upper-inner quadrant of right female breast: Secondary | ICD-10-CM

## 2021-06-12 ENCOUNTER — Other Ambulatory Visit: Payer: Self-pay

## 2021-06-12 ENCOUNTER — Encounter: Payer: Self-pay | Admitting: Hematology and Oncology

## 2021-06-12 ENCOUNTER — Other Ambulatory Visit (HOSPITAL_BASED_OUTPATIENT_CLINIC_OR_DEPARTMENT_OTHER): Payer: Self-pay

## 2021-06-12 ENCOUNTER — Inpatient Hospital Stay: Payer: 59

## 2021-06-12 ENCOUNTER — Inpatient Hospital Stay: Payer: 59 | Attending: Hematology and Oncology | Admitting: Hematology and Oncology

## 2021-06-12 VITALS — BP 137/80 | HR 99 | Temp 97.7°F | Resp 17 | Wt 168.8 lb

## 2021-06-12 DIAGNOSIS — Z923 Personal history of irradiation: Secondary | ICD-10-CM | POA: Insufficient documentation

## 2021-06-12 DIAGNOSIS — Z79810 Long term (current) use of selective estrogen receptor modulators (SERMs): Secondary | ICD-10-CM | POA: Insufficient documentation

## 2021-06-12 DIAGNOSIS — C50211 Malignant neoplasm of upper-inner quadrant of right female breast: Secondary | ICD-10-CM | POA: Insufficient documentation

## 2021-06-12 DIAGNOSIS — Z79899 Other long term (current) drug therapy: Secondary | ICD-10-CM | POA: Diagnosis not present

## 2021-06-12 DIAGNOSIS — Z170 Estrogen receptor positive status [ER+]: Secondary | ICD-10-CM

## 2021-06-12 LAB — CBC WITH DIFFERENTIAL (CANCER CENTER ONLY)
Abs Immature Granulocytes: 0.02 10*3/uL (ref 0.00–0.07)
Basophils Absolute: 0 10*3/uL (ref 0.0–0.1)
Basophils Relative: 1 %
Eosinophils Absolute: 0.2 10*3/uL (ref 0.0–0.5)
Eosinophils Relative: 4 %
HCT: 38.3 % (ref 36.0–46.0)
Hemoglobin: 13.3 g/dL (ref 12.0–15.0)
Immature Granulocytes: 0 %
Lymphocytes Relative: 22 %
Lymphs Abs: 1.5 10*3/uL (ref 0.7–4.0)
MCH: 29.6 pg (ref 26.0–34.0)
MCHC: 34.7 g/dL (ref 30.0–36.0)
MCV: 85.3 fL (ref 80.0–100.0)
Monocytes Absolute: 0.5 10*3/uL (ref 0.1–1.0)
Monocytes Relative: 8 %
Neutro Abs: 4.4 10*3/uL (ref 1.7–7.7)
Neutrophils Relative %: 65 %
Platelet Count: 220 10*3/uL (ref 150–400)
RBC: 4.49 MIL/uL (ref 3.87–5.11)
RDW: 13.1 % (ref 11.5–15.5)
WBC Count: 6.7 10*3/uL (ref 4.0–10.5)
nRBC: 0 % (ref 0.0–0.2)

## 2021-06-12 LAB — CMP (CANCER CENTER ONLY)
ALT: 43 U/L (ref 0–44)
AST: 61 U/L — ABNORMAL HIGH (ref 15–41)
Albumin: 3.9 g/dL (ref 3.5–5.0)
Alkaline Phosphatase: 93 U/L (ref 38–126)
Anion gap: 10 (ref 5–15)
BUN: 17 mg/dL (ref 6–20)
CO2: 23 mmol/L (ref 22–32)
Calcium: 8.6 mg/dL — ABNORMAL LOW (ref 8.9–10.3)
Chloride: 106 mmol/L (ref 98–111)
Creatinine: 1.08 mg/dL — ABNORMAL HIGH (ref 0.44–1.00)
GFR, Estimated: >60 mL/min (ref >60)
Glucose, Bld: 105 mg/dL — ABNORMAL HIGH (ref 70–99)
Potassium: 3.5 mmol/L (ref 3.5–5.1)
Sodium: 139 mmol/L (ref 135–145)
Total Bilirubin: 0.5 mg/dL (ref 0.3–1.2)
Total Protein: 7.1 g/dL (ref 6.5–8.1)

## 2021-06-12 MED ORDER — TAMOXIFEN CITRATE 20 MG PO TABS
20.0000 mg | ORAL_TABLET | Freq: Every day | ORAL | 3 refills | Status: DC
  Filled 2021-06-12: qty 90, 90d supply, fill #0
  Filled 2021-08-25: qty 90, 90d supply, fill #1
  Filled 2021-11-14: qty 90, 90d supply, fill #2
  Filled 2022-02-11: qty 90, 90d supply, fill #3

## 2021-06-12 NOTE — Progress Notes (Signed)
?[place]  ?[phone] [phone]  ? ? ? ?ID: Doyce Loose DOB: 05/18/1965  MR#: [id number]  EKC#:[id number] ? ?Patient Care Team: ?Hali Marry, MD as PCP - General ?Louretta Shorten, MD (Obstetrics and Gynecology) ?Mauro Kaufmann, RN as Oncology Nurse Navigator ?Rockwell Germany, RN as Oncology Nurse Navigator ?[name], MD as Consulting Physician (General Surgery) ?Magrinat, Virgie Dad, MD (Inactive) as Consulting Physician (Oncology) ?Kyung Rudd, MD as Consulting Physician (Radiation Oncology) ?Benay Pike, MD ?OTHER MD: ? ?CHIEF COMPLAINT: Estrogen receptor positive breast cancer ? ?CURRENT TREATMENT: tamoxifen ? ? ?INTERVAL HISTORY: ? ?Judy "Maudie [name]" returns today for follow up of her estrogen receptor positive breast cancer. ?She started tamoxifen on 05/08/2019.  She had some mild hot flashes with tamoxifen otherwise very well-tolerated.  She apparently ran out of her tamoxifen recently and thought she was done with it.  She otherwise denies any new breast changes except for some tightening in the right breast and right axilla which has been that way since surgery.  No vaginal bleeding, asymmetrical lower extremity swelling or change in breathing.  No new neurological complaints.  Rest of the pertinent 10 point ROS reviewed and negative. ? ? ? ? COVID 19 VACCINATION STATUS: fully vaccinated [employer]), with booster October 2021; positive at home test on 05/12/2020 ? ? ?HISTORY OF CURRENT ILLNESS: ?From the original intake note: ? ?[name] had routine screening mammography on 12/05/2018 showing a possible abnormality in the right breast. She underwent right diagnostic mammography with tomography and right breast ultrasonography at [facility] on 12/08/2018 showing: breast density category C; 1.7 cm irregular mass with associated distortion in the right breast at 1:30; right axilla negative for lymphadenopathy.  ? ?Accordingly on 12/09/2018 she  proceeded to biopsy of the right breast area in question. The pathology from this procedure ([id number]) showed: invasive ductal carcinoma, grade 2; ductal carcinoma in situ, intermediate grade. Prognostic indicators significant for: estrogen receptor, 90% positive and progesterone receptor, 95% positive, both with strong staining intensity. Proliferation marker Ki67 at 10%. HER2 equivocal by immunohistochemistry (2+), but negative by fluorescent in situ hybridization with a signals ratio 1.32 and number per cell 2.25. ? ?The patient's subsequent history is as detailed below. ? ? ?PAST MEDICAL HISTORY: ?Past Medical History:  ?Diagnosis Date  ? Cancer [name])   ? Eczema   ? legs  ? Family history of brain cancer   ? Family history of breast cancer   ? Family history of lung cancer   ? Family history of ovarian cancer   ? Hallux valgus 06/2012  ? left  ? Hammertoe 06/2012  ? left second  ? Heart murmur   ? since birth, has had no testing, no problems  ? History of frequent ear infections   ? Hypertension   ? under control with med., has been on med. x 3 yr.  ? Irritable bowel syndrome (IBS)   ? no current med.  ? Personal history of radiation therapy   ? Sleep apnea   ? Wears hearing aid   ? bilateral  ? ? ?PAST SURGICAL HISTORY: ?Past Surgical History:  ?Procedure Laterality Date  ? BREAST BIOPSY Right 12/22/2018  ? BREAST LUMPECTOMY Right 01/19/2019  ? BREAST LUMPECTOMY WITH RADIOACTIVE SEED AND SENTINEL LYMPH NODE BIOPSY Right 01/19/2019  ? Procedure: RIGHT BREAST LUMPECTOMY WITH RADIOACTIVE SEED AND SENTINEL LYMPH NODE BIOPSY;  Surgeon: [name], MD;  Location: [place];  Service: General;  Laterality: Right;  ?  EXPLORATORY TYMPANOTOMY Left 06/25/2000  ? with lysis of adhesions  ? HAMMER TOE SURGERY Left 06/16/2012  ? Procedure: LEFT SCARF MODIFIED MCBRIDE AND SECOND Metatarsal WEIL;  Surgeon: Wylene Simmer, MD;  Location: [place];  Service: Orthopedics;  Laterality: Left;  ? MASTOPEXY Left 03/19/2020   ? Procedure: MASTOPEXY;  Surgeon: Irene Limbo, MD;  Location: [place];  Service: Plastics;  Laterality: Left;  ? TONSILLECTOMY    ? TYMPANOPLASTY Left 06/25/2000  ? TYMPANOSTOMY TUBE PLACEMENT  10/10/2009  ? ? ?FAMILY HISTORY: ?Family History  ?Problem Relation Age of Onset  ? Alzheimer's disease Mother   ? Brain cancer Father 45  ?     Glioblastoma multiforma.   ? Ovarian cancer Paternal Aunt   ?     dx. 59s or 41s  ? Lung cancer Other   ?     dx. 26s (maternal great-aunt)  ?Patient's father was 44 years old when he died from glioblastoma, diagnosed age 69.. Patient's mother died at age 65. The patient notes a family hx of breast and ovarian cancer. A paternal aunt was diagnosed with ovarian cancer (age unsure) and a maternal cousin with breast cancer (age unknown).  The patient has 3 brothers, no sisters.  ? ? ?GYNECOLOGIC HISTORY:  ?No LMP recorded. (Menstrual status: Other). ?Menarche: 56 years old ?Age at first live birth: 56 years old ?GX P 2 ?LMP 12/14/2018, having regular periods ?Contraceptive: yes, used for 29 years, stopped 12/2018; currently barrier methods ?HRT n/a  ?Hysterectomy? no ?BSO? no ? ? ?SOCIAL HISTORY: (updated April 2022) ?Alianis "Maudie [name]" is currently working as an Investment banker, corporate for [employer]. Husband Jeneen Rinks is a Designer, jewellery. Daughter Benjamine Mola is a Programme researcher, broadcasting/film/video at [employer] here in [place].  She had the patient's son Edison Nasuti are enrolled at [facility].  The patient attends [employer] in [place]. ?  ? ADVANCED DIRECTIVES: not in place.  In the absence of any documents to the contrary husband Jeneen Rinks is automatically her HCPOA. ? ? ?HEALTH MAINTENANCE: ?Social History  ? ?Tobacco Use  ? Smoking status: Never  ? Smokeless tobacco: Never  ?Vaping Use  ? Vaping Use: Never used  ?Substance Use Topics  ? Alcohol use: Not Currently  ?  Comment: occasionally  ? Drug use: No  ? ? ? Colonoscopy: never done ? PAP: 12/01/2018, negative ? Bone density: not on file, due  for repeat 01/2019 ?  ?Allergies  ?Allergen Reactions  ? Erythromycin Nausea And Vomiting  ?  FACIAL NUMBNESS  ? Nitrofurantoin Other (See Comments)  ?  GI UPSET  ? Lexapro [Escitalopram Oxalate] Nausea Only  ? Lisinopril Cough  ? ? ?Current Outpatient Medications  ?Medication Sig Dispense Refill  ? AMBULATORY NON FORMULARY MEDICATION Medication Name: New CPAP machine. Old machine broken. Set  to 17 cm water pressure. Fax to [facility] [phone]. 1 Units PRN  ? FLUoxetine (PROZAC) 10 MG capsule Take 2 capsules (20 mg total) by mouth daily. 60 capsule 1  ? losartan-hydrochlorothiazide (HYZAAR) 100-25 MG tablet Take 1 tablet by mouth daily. 90 tablet 1  ? medroxyPROGESTERone (PROVERA) 10 MG tablet Take 1 tablet by mouth once daily for 10 days as needed every 2 months if no mesnes 10 tablet 6  ? tamoxifen (NOLVADEX) 20 MG tablet Take 1 tablet (20 mg total) by mouth daily. 30 tablet 0  ? ?No current facility-administered medications for this visit.  ? ? ?OBJECTIVE:  white woman who appears stated age ? ?There were no  vitals filed for this visit. ?   There is no height or weight on file to calculate BMI.   ?Wt Readings from Last 3 Encounters:  ?02/17/21 173 lb (78.5 kg)  ?08/16/20 172 lb (78 kg)  ?06/11/20 173 lb 12.8 oz (78.8 kg)  ? ? ?  ECOG FS:1 - Symptomatic but completely ambulatory ? ?Physical Exam ?Constitutional:   ?   Appearance: Normal appearance.  ?Chest:  ?   Comments: Bilateral breast examined.  Right breast status postradiation changes with some tightening in the right breast and right axilla.  No other palpable masses or regional adenopathy.  Left breast normal to inspection and palpation. ?Musculoskeletal:  ?   Cervical back: Normal range of motion and neck supple. No rigidity.  ?Lymphadenopathy:  ?   Cervical: No cervical adenopathy.  ?Neurological:  ?   Mental Status: She is alert.  ? ? ? ? ?LAB RESULTS: ? ?CMP  ?   ?Component Value Date/Time  ? NA 142 02/17/2021 0000  ? K 3.8 02/17/2021 0000  ? CL 106  02/17/2021 0000  ? CO2 25 02/17/2021 0000  ? GLUCOSE 108 (H) 02/17/2021 0000  ? BUN 19 02/17/2021 0000  ? CREATININE 1.05 (H) 02/17/2021 0000  ? CALCIUM 9.1 02/17/2021 0000  ? PROT 7.4 02/17/2021 0000

## 2021-06-19 DIAGNOSIS — G4733 Obstructive sleep apnea (adult) (pediatric): Secondary | ICD-10-CM | POA: Diagnosis not present

## 2021-06-30 ENCOUNTER — Other Ambulatory Visit (HOSPITAL_BASED_OUTPATIENT_CLINIC_OR_DEPARTMENT_OTHER): Payer: Self-pay

## 2021-06-30 ENCOUNTER — Other Ambulatory Visit: Payer: Self-pay | Admitting: Family Medicine

## 2021-06-30 MED ORDER — FLUOXETINE HCL 10 MG PO CAPS
20.0000 mg | ORAL_CAPSULE | Freq: Every day | ORAL | 1 refills | Status: DC
  Filled 2021-06-30: qty 180, 90d supply, fill #0
  Filled 2021-09-16: qty 180, 90d supply, fill #1

## 2021-07-19 DIAGNOSIS — G4733 Obstructive sleep apnea (adult) (pediatric): Secondary | ICD-10-CM | POA: Diagnosis not present

## 2021-08-18 ENCOUNTER — Ambulatory Visit: Payer: 59 | Admitting: Family Medicine

## 2021-08-18 ENCOUNTER — Other Ambulatory Visit (HOSPITAL_BASED_OUTPATIENT_CLINIC_OR_DEPARTMENT_OTHER): Payer: Self-pay

## 2021-08-18 ENCOUNTER — Encounter: Payer: Self-pay | Admitting: Family Medicine

## 2021-08-18 VITALS — BP 102/62 | HR 94 | Ht 61.0 in | Wt 165.0 lb

## 2021-08-18 DIAGNOSIS — I10 Essential (primary) hypertension: Secondary | ICD-10-CM

## 2021-08-18 DIAGNOSIS — F439 Reaction to severe stress, unspecified: Secondary | ICD-10-CM

## 2021-08-18 DIAGNOSIS — Z23 Encounter for immunization: Secondary | ICD-10-CM

## 2021-08-18 MED ORDER — LOSARTAN POTASSIUM-HCTZ 100-25 MG PO TABS
1.0000 | ORAL_TABLET | Freq: Every day | ORAL | 1 refills | Status: DC
  Filled 2021-08-18: qty 90, 90d supply, fill #0
  Filled 2021-11-14: qty 90, 90d supply, fill #1

## 2021-08-18 NOTE — Assessment & Plan Note (Signed)
She feels like she is doing well on her fluoxetine and is happy with her current regimen she does not want to make any changes or adjustments today.

## 2021-08-18 NOTE — Progress Notes (Signed)
   Established Patient Office Visit  Subjective   Patient ID: [name], female    DOB: [birth year]  Age: 56 y.o. MRN: [id number]  Chief Complaint  Patient presents with   Hypertension    HPI  Hypertension- Pt denies chest pain, SOB, dizziness, or heart palpitations.  Taking meds as directed w/o problems.  Denies medication side effects.    Stress-reports happy with her current dose of fluoxetine.  They did just sell their house and moved recently and they are moving into a new built home in August.  She does work from home.    ROS    Objective:     BP 102/62   Pulse 94   Ht '5\' 1"'$  (1.549 m)   Wt 165 lb (74.8 kg)   SpO2 96%   BMI 31.18 kg/m    Physical Exam Vitals and nursing note reviewed.  Constitutional:      Appearance: She is well-developed.  HENT:     Head: Normocephalic and atraumatic.  Cardiovascular:     Rate and Rhythm: Normal rate and regular rhythm.     Heart sounds: Normal heart sounds.  Pulmonary:     Effort: Pulmonary effort is normal.     Breath sounds: Normal breath sounds.  Skin:    General: Skin is warm and dry.  Neurological:     Mental Status: She is alert and oriented to person, place, and time.  Psychiatric:        Behavior: Behavior normal.     No results found for any visits on 08/18/21.    The 10-year ASCVD risk score (Arnett DK, et al., 2019) is: 2.3%    Assessment & Plan:   Problem List Items Addressed This Visit       Cardiovascular and Mediastinum   Essential hypertension - Primary    Well controlled. Continue current regimen. Follow up in  6 mo       Relevant Medications   losartan-hydrochlorothiazide (HYZAAR) 100-25 MG tablet     Other   Stress    She feels like she is doing well on her fluoxetine and is happy with her current regimen she does not want to make any changes or adjustments today.      Other Visit Diagnoses     Need for Zostavax administration       Relevant Orders    Varicella-zoster vaccine IM (Shingrix) (Completed)     \ For shingles vaccine given today.  Return in about 6 months (around 02/17/2022) for htn/2nd shingrix.    Beatrice Lecher, MD

## 2021-08-18 NOTE — Assessment & Plan Note (Signed)
Well controlled. Continue current regimen. Follow up in  6 mo  

## 2021-08-19 DIAGNOSIS — G4733 Obstructive sleep apnea (adult) (pediatric): Secondary | ICD-10-CM | POA: Diagnosis not present

## 2021-08-25 ENCOUNTER — Other Ambulatory Visit (HOSPITAL_BASED_OUTPATIENT_CLINIC_OR_DEPARTMENT_OTHER): Payer: Self-pay

## 2021-09-16 ENCOUNTER — Other Ambulatory Visit (HOSPITAL_BASED_OUTPATIENT_CLINIC_OR_DEPARTMENT_OTHER): Payer: Self-pay

## 2021-09-16 ENCOUNTER — Telehealth: Payer: 59 | Admitting: Physician Assistant

## 2021-09-16 DIAGNOSIS — H60331 Swimmer's ear, right ear: Secondary | ICD-10-CM

## 2021-09-16 MED ORDER — CIPROFLOXACIN HCL 0.2 % OT SOLN
0.2000 mL | Freq: Two times a day (BID) | OTIC | 0 refills | Status: AC
  Filled 2021-09-16: qty 14, 7d supply, fill #0

## 2021-09-16 NOTE — Progress Notes (Signed)
I have spent 5 minutes in review of e-visit questionnaire, review and updating patient chart, medical decision making and response to patient.   Trevonn Hallum Cody Keyana Guevara, PA-C    

## 2021-09-16 NOTE — Progress Notes (Signed)
E Visit for Swimmer's Ear  We are sorry that you are not feeling well. Here is how we plan to help!  Based on what you have shared with me it looks like you have swimmers ear. Swimmer's ear is a redness or swelling, irritation, or infection of your outer ear canal.  These symptoms usually occur within a few days of swimming.  Your ear canal is a tube that goes from the opening of the ear to the eardrum.  When water stays in your ear canal, germs can grow.  This is a painful condition that often happens to children and swimmers of all ages.  It is not contagious and oral antibiotics are not required to treat uncomplicated swimmer's ear.  The usual symptoms include: Itching inside the ear, Redness or a sense of swelling in the ear, Pain when the ear is tugged on when pressure is placed on the ear, Pus draining from the infected ear. and I have prescribed: Ciprofloxin 0.2% and hydrocortisone 1% otic suspension 3 drops in affected ears twice daily for 7 days   In certain cases swimmer's ear may progress to a more serious bacterial infection of the middle or inner ear.  If you have a fever 102 and up and significantly worsening symptoms, this could indicate a more serious infection moving to the middle/inner and needs face to face evaluation in an office by a provider.  Your symptoms should improve over the next 3 days and should resolve in about 7 days.  HOME CARE:  Wash your hands frequently. Do not place the tip of the bottle on your ear or touch it with your fingers. You can take Acetominophen 650 mg every 4-6 hours as needed for pain.  If pain is severe or moderate, you can apply a heating pad (set on low) or hot water bottle (wrapped in a towel) to outer ear for 20 minutes.  This will also increase drainage. Avoid ear plugs Do not use Q-tips After showers, help the water run out by tilting your head to one side.  GET HELP RIGHT AWAY IF:  Fever is over 102.2 degrees. You develop progressive  ear pain or hearing loss. Ear symptoms persist longer than 3 days after treatment.  MAKE SURE YOU:  Understand these instructions. Will watch your condition. Will get help right away if you are not doing well or get worse.  TO PREVENT SWIMMER'S EAR: Use a bathing cap or custom fitted swim molds to keep your ears dry. Towel off after swimming to dry your ears. Tilt your head or pull your earlobes to allow the water to escape your ear canal. If there is still water in your ears, consider using a hairdryer on the lowest setting.   Thank you for choosing an e-visit.  Your e-visit answers were reviewed by a board certified advanced clinical practitioner to complete your personal care plan. Depending upon the condition, your plan could have included both over the counter or prescription medications.  Please review your pharmacy choice. Make sure the pharmacy is open so you can pick up prescription now. If there is a problem, you may contact your provider through [employer] and have the prescription routed to another pharmacy.  Your safety is important to [place]. If you have drug allergies check your prescription carefully.   For the next 24 hours you can use MyChart to ask questions about today's visit, request a non-urgent call back, or ask for a work or school excuse. You will get  an email in the next two days asking about your experience. I hope that your e-visit has been valuable and will speed your recovery.

## 2021-09-17 ENCOUNTER — Other Ambulatory Visit (HOSPITAL_BASED_OUTPATIENT_CLINIC_OR_DEPARTMENT_OTHER): Payer: Self-pay

## 2021-10-08 ENCOUNTER — Other Ambulatory Visit (HOSPITAL_BASED_OUTPATIENT_CLINIC_OR_DEPARTMENT_OTHER): Payer: Self-pay

## 2021-11-14 ENCOUNTER — Other Ambulatory Visit: Payer: Self-pay | Admitting: Family Medicine

## 2021-11-14 ENCOUNTER — Other Ambulatory Visit (HOSPITAL_BASED_OUTPATIENT_CLINIC_OR_DEPARTMENT_OTHER): Payer: Self-pay

## 2021-11-17 ENCOUNTER — Other Ambulatory Visit (HOSPITAL_BASED_OUTPATIENT_CLINIC_OR_DEPARTMENT_OTHER): Payer: Self-pay

## 2021-11-17 MED ORDER — FLUOXETINE HCL 10 MG PO CAPS
20.0000 mg | ORAL_CAPSULE | Freq: Every day | ORAL | 1 refills | Status: DC
  Filled 2021-11-17 – 2021-12-23 (×3): qty 180, 90d supply, fill #0
  Filled 2022-03-23: qty 180, 90d supply, fill #1

## 2021-11-20 DIAGNOSIS — G4733 Obstructive sleep apnea (adult) (pediatric): Secondary | ICD-10-CM | POA: Diagnosis not present

## 2021-12-19 ENCOUNTER — Other Ambulatory Visit (HOSPITAL_BASED_OUTPATIENT_CLINIC_OR_DEPARTMENT_OTHER): Payer: Self-pay

## 2021-12-23 ENCOUNTER — Other Ambulatory Visit (HOSPITAL_BASED_OUTPATIENT_CLINIC_OR_DEPARTMENT_OTHER): Payer: Self-pay

## 2021-12-24 ENCOUNTER — Other Ambulatory Visit (HOSPITAL_BASED_OUTPATIENT_CLINIC_OR_DEPARTMENT_OTHER): Payer: Self-pay

## 2022-02-11 ENCOUNTER — Other Ambulatory Visit (HOSPITAL_BASED_OUTPATIENT_CLINIC_OR_DEPARTMENT_OTHER): Payer: Self-pay

## 2022-02-11 ENCOUNTER — Other Ambulatory Visit: Payer: Self-pay | Admitting: Family Medicine

## 2022-02-11 DIAGNOSIS — I10 Essential (primary) hypertension: Secondary | ICD-10-CM

## 2022-02-11 MED ORDER — LOSARTAN POTASSIUM-HCTZ 100-25 MG PO TABS
1.0000 | ORAL_TABLET | Freq: Every day | ORAL | 1 refills | Status: DC
  Filled 2022-02-11: qty 90, 90d supply, fill #0
  Filled 2022-05-12: qty 90, 90d supply, fill #1

## 2022-02-17 ENCOUNTER — Ambulatory Visit: Payer: 59 | Admitting: Family Medicine

## 2022-02-17 ENCOUNTER — Encounter: Payer: Self-pay | Admitting: Family Medicine

## 2022-02-17 VITALS — BP 109/69 | HR 92 | Ht 61.0 in | Wt 165.7 lb

## 2022-02-17 DIAGNOSIS — I10 Essential (primary) hypertension: Secondary | ICD-10-CM

## 2022-02-17 DIAGNOSIS — E781 Pure hyperglyceridemia: Secondary | ICD-10-CM

## 2022-02-17 DIAGNOSIS — G4733 Obstructive sleep apnea (adult) (pediatric): Secondary | ICD-10-CM

## 2022-02-17 DIAGNOSIS — F439 Reaction to severe stress, unspecified: Secondary | ICD-10-CM | POA: Diagnosis not present

## 2022-02-17 DIAGNOSIS — Z23 Encounter for immunization: Secondary | ICD-10-CM

## 2022-02-17 NOTE — Assessment & Plan Note (Signed)
Plan to repeat blood pressure before she goes today since initial 1 was a little borderline elevated.  She will need refills on her medication.

## 2022-02-17 NOTE — Progress Notes (Signed)
Established Patient Office Visit  Subjective   Patient ID: [name], female    DOB: [birth year]  Age: 56 y.o. MRN: [id number]  Chief Complaint  Patient presents with   Hypertension    HPI  Hypertension- Pt denies chest pain, SOB, dizziness, or heart palpitations.  Taking meds as directed w/o problems.  Denies medication side effects.    Pap is scheduled for January .    Due for 2nd Shingrix vaccine.   Follow-up stress-overall she is doing well currently on 2 fluoxetine 10 mg capsule daily.  She is thinking about possibly tapering down after the holidays.    ROS    Objective:     BP 109/69 (BP Location: Left Arm, Patient Position: Sitting, Cuff Size: Small)   Pulse 92   Ht '5\' 1"'$  (1.549 m)   Wt 165 lb 11.2 oz (75.2 kg)   SpO2 96%   BMI 31.31 kg/m    Physical Exam Vitals and nursing note reviewed.  Constitutional:      Appearance: She is well-developed.  HENT:     Head: Normocephalic and atraumatic.  Cardiovascular:     Rate and Rhythm: Normal rate and regular rhythm.     Heart sounds: Normal heart sounds.  Pulmonary:     Effort: Pulmonary effort is normal.     Breath sounds: Normal breath sounds.  Skin:    General: Skin is warm and dry.  Neurological:     Mental Status: She is alert and oriented to person, place, and time.  Psychiatric:        Behavior: Behavior normal.      No results found for any visits on 02/17/22.    The 10-year ASCVD risk score (Arnett DK, et al., 2019) is: 2.9%    Assessment & Plan:   Problem List Items Addressed This Visit       Cardiovascular and Mediastinum   Essential hypertension - Primary    Plan to repeat blood pressure before she goes today since initial 1 was a little borderline elevated.  She will need refills on her medication.      Relevant Orders   Lipid Panel w/reflex Direct LDL   COMPLETE METABOLIC PANEL WITH GFR   CBC   TSH     Respiratory   OSA (obstructive sleep apnea)     Wearing her CPAP very consistently.  She.  She has noticed that she is having a little bit more leaks with her current mask so she says she will reach out to the supply company as it may be they can give her an alternate mask to try.      Relevant Orders   Lipid Panel w/reflex Direct LDL   COMPLETE METABOLIC PANEL WITH GFR   CBC   TSH     Other   Stress    If she is okay with tapering after the holidays she can drop down to 10 mg and then reach out and I can give her a taper to come completely off if she is doing well otherwise.      Hypertriglyceridemia    Due to recheck lipids today.      Relevant Orders   Lipid Panel w/reflex Direct LDL   COMPLETE METABOLIC PANEL WITH GFR   CBC   TSH   Other Visit Diagnoses     Need for shingles vaccine       Relevant Orders   Zoster Recombinant (Shingrix ) (Completed)      Did  encourage her to consider getting the COVID-vaccine updated as well.  Return in about 6 months (around 08/19/2022) for Hypertension.    Beatrice Lecher, MD

## 2022-02-17 NOTE — Assessment & Plan Note (Addendum)
Wearing her CPAP very consistently.  She.  She has noticed that she is having a little bit more leaks with her current mask so she says she will reach out to the supply company as it may be they can give her an alternate mask to try.

## 2022-02-17 NOTE — Assessment & Plan Note (Signed)
Due to recheck lipids today.

## 2022-02-17 NOTE — Assessment & Plan Note (Signed)
If she is okay with tapering after the holidays she can drop down to 10 mg and then reach out and I can give her a taper to come completely off if she is doing well otherwise.

## 2022-02-18 LAB — COMPLETE METABOLIC PANEL WITH GFR
AG Ratio: 1.6 (calc) (ref 1.0–2.5)
ALT: 38 U/L — ABNORMAL HIGH (ref 6–29)
AST: 54 U/L — ABNORMAL HIGH (ref 10–35)
Albumin: 4.1 g/dL (ref 3.6–5.1)
Alkaline phosphatase (APISO): 82 U/L (ref 37–153)
BUN: 20 mg/dL (ref 7–25)
CO2: 22 mmol/L (ref 20–32)
Calcium: 8.8 mg/dL (ref 8.6–10.4)
Chloride: 106 mmol/L (ref 98–110)
Creat: 1.01 mg/dL (ref 0.50–1.03)
Globulin: 2.6 g/dL (calc) (ref 1.9–3.7)
Glucose, Bld: 96 mg/dL (ref 65–99)
Potassium: 4 mmol/L (ref 3.5–5.3)
Sodium: 141 mmol/L (ref 135–146)
Total Bilirubin: 0.7 mg/dL (ref 0.2–1.2)
Total Protein: 6.7 g/dL (ref 6.1–8.1)
eGFR: 65 mL/min/{1.73_m2} (ref >=60)

## 2022-02-18 LAB — TSH: TSH: 3.55 mIU/L (ref 0.40–4.50)

## 2022-02-18 LAB — CBC
HCT: 38.6 % (ref 35.0–45.0)
Hemoglobin: 13.1 g/dL (ref 11.7–15.5)
MCH: 29.2 pg (ref 27.0–33.0)
MCHC: 33.9 g/dL (ref 32.0–36.0)
MCV: 86 fL (ref 80.0–100.0)
MPV: 10.3 fL (ref 7.5–12.5)
Platelets: 237 10*3/uL (ref 140–400)
RBC: 4.49 10*6/uL (ref 3.80–5.10)
RDW: 12.9 % (ref 11.0–15.0)
WBC: 6.5 10*3/uL (ref 3.8–10.8)

## 2022-02-18 LAB — LIPID PANEL W/REFLEX DIRECT LDL
Cholesterol: 175 mg/dL (ref <200)
HDL: 45 mg/dL — ABNORMAL LOW (ref >=50)
LDL Cholesterol (Calc): 104 mg/dL (calc) — ABNORMAL HIGH
Non-HDL Cholesterol (Calc): 130 mg/dL (calc) — ABNORMAL HIGH (ref <130)
Total CHOL/HDL Ratio: 3.9 (calc) (ref <5.0)
Triglycerides: 146 mg/dL (ref <150)

## 2022-02-18 NOTE — Progress Notes (Signed)
Hi [name], kidney function is stable.  Liver enzymes are still mildly elevated.  But they do look a little better compared to last time which is great.  Continue to work on healthy diet and regular exercise it reduces inflammation in the liver and avoiding alcohol and Tylenol products.  LDL cholesterol also looks better which is great.  Your thyroid and blood count are normal.

## 2022-02-25 ENCOUNTER — Encounter: Payer: Self-pay | Admitting: Family Medicine

## 2022-03-05 ENCOUNTER — Ambulatory Visit
Admission: RE | Admit: 2022-03-05 | Discharge: 2022-03-05 | Disposition: A | Discharge status: Home / Self Care | Payer: 59 | Source: Ambulatory Visit | Attending: Hematology and Oncology | Admitting: Hematology and Oncology

## 2022-03-05 ENCOUNTER — Other Ambulatory Visit: Payer: Self-pay | Admitting: Hematology and Oncology

## 2022-03-05 DIAGNOSIS — Z170 Estrogen receptor positive status [ER+]: Secondary | ICD-10-CM

## 2022-03-05 DIAGNOSIS — R921 Mammographic calcification found on diagnostic imaging of breast: Secondary | ICD-10-CM

## 2022-03-13 ENCOUNTER — Other Ambulatory Visit: Payer: Self-pay | Admitting: Hematology and Oncology

## 2022-03-13 ENCOUNTER — Ambulatory Visit
Admission: RE | Admit: 2022-03-13 | Discharge: 2022-03-13 | Disposition: A | Discharge status: Home / Self Care | Payer: Commercial Managed Care - PPO | Source: Ambulatory Visit | Attending: Hematology and Oncology | Admitting: Hematology and Oncology

## 2022-03-13 ENCOUNTER — Other Ambulatory Visit: Payer: Self-pay | Admitting: Diagnostic Radiology

## 2022-03-13 ENCOUNTER — Ambulatory Visit (HOSPITAL_COMMUNITY)
Admission: RE | Admit: 2022-03-13 | Discharge: 2022-03-13 | Disposition: A | Discharge status: Home / Self Care | Payer: Commercial Managed Care - PPO | Source: Ambulatory Visit | Attending: Hematology and Oncology | Admitting: Hematology and Oncology

## 2022-03-13 DIAGNOSIS — N6032 Fibrosclerosis of left breast: Secondary | ICD-10-CM | POA: Diagnosis not present

## 2022-03-13 DIAGNOSIS — Z170 Estrogen receptor positive status [ER+]: Secondary | ICD-10-CM

## 2022-03-13 DIAGNOSIS — R921 Mammographic calcification found on diagnostic imaging of breast: Secondary | ICD-10-CM

## 2022-03-13 DIAGNOSIS — N6022 Fibroadenosis of left breast: Secondary | ICD-10-CM | POA: Diagnosis not present

## 2022-03-13 HISTORY — PX: BREAST BIOPSY: SHX20

## 2022-03-31 DIAGNOSIS — Z124 Encounter for screening for malignant neoplasm of cervix: Secondary | ICD-10-CM | POA: Diagnosis not present

## 2022-03-31 DIAGNOSIS — Z6832 Body mass index (BMI) 32.0-32.9, adult: Secondary | ICD-10-CM | POA: Diagnosis not present

## 2022-03-31 DIAGNOSIS — N912 Amenorrhea, unspecified: Secondary | ICD-10-CM | POA: Diagnosis not present

## 2022-03-31 DIAGNOSIS — Z01419 Encounter for gynecological examination (general) (routine) without abnormal findings: Secondary | ICD-10-CM | POA: Diagnosis not present

## 2022-03-31 LAB — HM PAP SMEAR: HM Pap smear: UNDETERMINED

## 2022-04-02 ENCOUNTER — Other Ambulatory Visit (HOSPITAL_BASED_OUTPATIENT_CLINIC_OR_DEPARTMENT_OTHER): Payer: Self-pay

## 2022-04-02 MED ORDER — MEDROXYPROGESTERONE ACETATE 10 MG PO TABS
10.0000 mg | ORAL_TABLET | Freq: Every day | ORAL | 6 refills | Status: DC
  Filled 2022-04-02: qty 10, 10d supply, fill #0
  Filled 2022-05-12: qty 10, 10d supply, fill #1
  Filled 2022-07-09: qty 10, 10d supply, fill #2
  Filled 2022-08-07: qty 10, 10d supply, fill #3
  Filled 2022-09-18: qty 10, 10d supply, fill #4
  Filled 2022-11-07: qty 10, 10d supply, fill #5

## 2022-04-02 MED ORDER — CHOLECALCIFEROL 1.25 MG (50000 UT) PO CAPS
50000.0000 [IU] | ORAL_CAPSULE | ORAL | 0 refills | Status: DC
  Filled 2022-04-02: qty 12, 84d supply, fill #0

## 2022-04-23 ENCOUNTER — Encounter: Payer: Self-pay | Admitting: Family Medicine

## 2022-05-12 ENCOUNTER — Other Ambulatory Visit (HOSPITAL_BASED_OUTPATIENT_CLINIC_OR_DEPARTMENT_OTHER): Payer: Self-pay

## 2022-05-12 ENCOUNTER — Other Ambulatory Visit: Payer: Self-pay | Admitting: Hematology and Oncology

## 2022-05-12 MED ORDER — TAMOXIFEN CITRATE 20 MG PO TABS
20.0000 mg | ORAL_TABLET | Freq: Every day | ORAL | 3 refills | Status: DC
  Filled 2022-05-12: qty 90, 90d supply, fill #0
  Filled 2022-08-07: qty 90, 90d supply, fill #1
  Filled 2022-11-07: qty 90, 90d supply, fill #2
  Filled 2023-02-02: qty 90, 90d supply, fill #3

## 2022-06-15 ENCOUNTER — Inpatient Hospital Stay: Payer: Commercial Managed Care - PPO | Attending: Hematology and Oncology | Admitting: Hematology and Oncology

## 2022-06-15 ENCOUNTER — Encounter: Payer: Self-pay | Admitting: Hematology and Oncology

## 2022-06-15 ENCOUNTER — Ambulatory Visit: Payer: 59 | Admitting: Hematology and Oncology

## 2022-06-15 VITALS — BP 153/65 | HR 104 | Temp 97.7°F | Resp 16 | Ht 61.0 in | Wt 167.5 lb

## 2022-06-15 DIAGNOSIS — C50211 Malignant neoplasm of upper-inner quadrant of right female breast: Secondary | ICD-10-CM | POA: Diagnosis not present

## 2022-06-15 DIAGNOSIS — Z8041 Family history of malignant neoplasm of ovary: Secondary | ICD-10-CM | POA: Diagnosis not present

## 2022-06-15 DIAGNOSIS — Z79810 Long term (current) use of selective estrogen receptor modulators (SERMs): Secondary | ICD-10-CM | POA: Insufficient documentation

## 2022-06-15 DIAGNOSIS — Z170 Estrogen receptor positive status [ER+]: Secondary | ICD-10-CM | POA: Insufficient documentation

## 2022-06-15 DIAGNOSIS — Z79899 Other long term (current) drug therapy: Secondary | ICD-10-CM | POA: Diagnosis not present

## 2022-06-15 DIAGNOSIS — Z801 Family history of malignant neoplasm of trachea, bronchus and lung: Secondary | ICD-10-CM | POA: Diagnosis not present

## 2022-06-15 DIAGNOSIS — Z923 Personal history of irradiation: Secondary | ICD-10-CM | POA: Diagnosis not present

## 2022-06-15 NOTE — Progress Notes (Signed)
[facility]  [phone] [phone]     ID: Judy SnowKimberly Hanlon [name] DOB: Aug 25, 1965  MR#: [id number]  JYN#:[id number]#:[id number]  Patient Care Team: Agapito GamesMetheney, Catherine D, MD as PCP - General Candice CampLowe, David, MD (Obstetrics and Gynecology) Pershing ProudStuart, Dawn C, RN as Oncology Nurse Navigator Donnelly AngelicaMartini, Keisha N, RN as Oncology Nurse Navigator Almond LintByerly, Faera, MD as Consulting Physician (General Surgery) Magrinat, Valentino HueGustav C, MD (Inactive) as Consulting Physician (Oncology) Dorothy PufferMoody, John, MD as Consulting Physician (Radiation Oncology) Rachel MouldsPraveena Joann Kulpa, MD  CHIEF COMPLAINT: Estrogen receptor positive breast cancer  CURRENT TREATMENT: tamoxifen   INTERVAL HISTORY:  Judy CourierKimberly "[name]" returns today for follow up of her estrogen receptor positive breast cancer.  She started tamoxifen on 05/08/2019.  She is tolerating it very well. No complaints at all. No LE swelling, post menopausal bleeding Since her last visit here, she had biopsy of left breast which turned out to be fat necrosis Rest of the pertinent 10 point ROS reviewed and neg.   COVID 19 VACCINATION STATUS: fully vaccinated [employer](Pfizer), with booster October 2021; positive at home test on 05/12/2020   HISTORY OF CURRENT ILLNESS: From the original intake note:  [name] had routine screening mammography on 12/05/2018 showing a possible abnormality in the right breast. She underwent right diagnostic mammography with tomography and right breast ultrasonography at [facility] on 12/08/2018 showing: breast density category C; 1.7 cm irregular mass with associated distortion in the right breast at 1:30; right axilla negative for lymphadenopathy.   Accordingly on 12/09/2018 she proceeded to biopsy of the right breast area in question. The pathology from this procedure ([id number]) showed: invasive ductal carcinoma, grade 2; ductal carcinoma in situ, intermediate grade. Prognostic indicators significant for: estrogen  receptor, 90% positive and progesterone receptor, 95% positive, both with strong staining intensity. Proliferation marker Ki67 at 10%. HER2 equivocal by immunohistochemistry (2+), but negative by fluorescent in situ hybridization with a signals ratio 1.32 and number per cell 2.25.  The patient's subsequent history is as detailed below.   PAST MEDICAL HISTORY: Past Medical History:  Diagnosis Date   Cancer    Eczema    legs   Family history of brain cancer    Family history of breast cancer    Family history of lung cancer    Family history of ovarian cancer    Hallux valgus 06/2012   left   Hammertoe 06/2012   left second   Heart murmur    since birth, has had no testing, no problems   History of frequent ear infections    Hypertension    under control with med., has been on med. x 3 yr.   Irritable bowel syndrome (IBS)    no current med.   Personal history of radiation therapy    Sleep apnea    Wears hearing aid    bilateral    PAST SURGICAL HISTORY: Past Surgical History:  Procedure Laterality Date   BREAST BIOPSY Right 12/22/2018   BREAST BIOPSY Left 03/13/2022   MM LT BREAST BX W LOC DEV EA AD LESION IMG BX SPEC STEREO GUIDE 03/13/2022 [facility]   BREAST BIOPSY Left 03/13/2022   MM LT BREAST BX W LOC DEV 1ST LESION IMAGE BX SPEC STEREO GUIDE 03/13/2022 [facility]   BREAST LUMPECTOMY Right 01/19/2019   BREAST LUMPECTOMY WITH RADIOACTIVE SEED AND SENTINEL LYMPH NODE BIOPSY Right 01/19/2019   Procedure: RIGHT BREAST LUMPECTOMY WITH RADIOACTIVE SEED AND SENTINEL LYMPH NODE BIOPSY;  Surgeon: Almond LintByerly, Faera,  MD;  Location: [facility];  Service: General;  Laterality: Right;   EXPLORATORY TYMPANOTOMY Left 06/25/2000   with lysis of adhesions   HAMMER TOE SURGERY Left 06/16/2012   Procedure: LEFT SCARF MODIFIED MCBRIDE AND SECOND Metatarsal WEIL;  Surgeon: Toni Arthurs, MD;  Location: [facility];  Service: Orthopedics;  Laterality: Left;   MASTOPEXY Left  03/19/2020   Procedure: MASTOPEXY;  Surgeon: Glenna Fellows, MD;  Location: [facility];  Service: Plastics;  Laterality: Left;   TONSILLECTOMY     TYMPANOPLASTY Left 06/25/2000   TYMPANOSTOMY TUBE PLACEMENT  10/10/2009    FAMILY HISTORY: Family History  Problem Relation Age of Onset   Alzheimer's disease Mother    Brain cancer Father 52       Glioblastoma multiforma.    Ovarian cancer Paternal Aunt        dx. 40s or 50s   Lung cancer Other        dx. 38s (maternal great-aunt)  Patient's father was 55 years old when he died from glioblastoma, diagnosed age 71.. Patient's mother died at age 44. The patient notes a family hx of breast and ovarian cancer. A paternal aunt was diagnosed with ovarian cancer (age unsure) and a maternal cousin with breast cancer (age unknown).  The patient has 3 brothers, no sisters.    GYNECOLOGIC HISTORY:  No LMP recorded. (Menstrual status: Other). Menarche: 57 years old Age at first live birth: 57 years old GX P 2 LMP 12/14/2018, having regular periods Contraceptive: yes, used for 29 years, stopped 12/2018; currently barrier methods HRT n/a  Hysterectomy? no BSO? 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 Husband [name] is a Best boy. Daughter Lanora Manis is a Production assistant, radio at [employer] here in [place].  She had the patient's son Gerilyn Pilgrim are enrolled at [facility].  The patient attends [employer] in [place].    ADVANCED DIRECTIVES: not in place.  In the absence of any documents to the contrary husband [name] is automatically her HCPOA.   HEALTH MAINTENANCE: Social History   Tobacco Use   Smoking status: Never   Smokeless tobacco: Never  Vaping Use   Vaping Use: Never used  Substance Use Topics   Alcohol use: Not Currently    Comment: occasionally   Drug use: No     Colonoscopy: never done  PAP: 12/01/2018, negative  Bone density: not on  file, due for repeat 01/2019   Allergies  Allergen Reactions   Erythromycin Nausea And Vomiting    FACIAL NUMBNESS   Nitrofurantoin Other (See Comments)    GI UPSET   Lexapro [Escitalopram Oxalate] Nausea Only   Lisinopril Cough    Current Outpatient Medications  Medication Sig Dispense Refill   AMBULATORY NON FORMULARY MEDICATION Medication Name: New CPAP machine. Old machine broken. Set  to 17 cm water pressure. Fax to [place] [phone]. 1 Units PRN   Cholecalciferol 1.25 MG (50000 UT) capsule Take 1 capsule (50,000 Units total) by mouth once a week. 12 capsule 0   FLUoxetine (PROZAC) 10 MG capsule Take 2 capsules (20 mg total) by mouth daily. 180 capsule 1   losartan-hydrochlorothiazide (HYZAAR) 100-25 MG tablet Take 1 tablet by mouth daily. 90 tablet 1   medroxyPROGESTERone (PROVERA) 10 MG tablet Take 1 tablet by mouth once daily for 10 days as needed every 2 months if no mesnes 10 tablet 6   medroxyPROGESTERone (PROVERA) 10 MG tablet Take  1 tablet (10 mg total) by mouth daily for 10 days as needed every 2 months if no menses. 10 tablet 6   tamoxifen (NOLVADEX) 20 MG tablet Take 1 tablet (20 mg total) by mouth daily. 90 tablet 3   No current facility-administered medications for this visit.    OBJECTIVE:  white woman who appears stated age  Vitals:   06/15/22 0852  BP: (!) 153/65  Pulse: (!) 104  Resp: 16  Temp: 97.7 F (36.5 C)  SpO2: 97%      Body mass index is 31.65 kg/m.   Wt Readings from Last 3 Encounters:  06/15/22 167 lb 8 oz (76 kg)  02/17/22 165 lb 11.2 oz (75.2 kg)  08/18/21 165 lb (74.8 kg)      ECOG FS:1 - Symptomatic but completely ambulatory  Physical Exam Constitutional:      Appearance: Normal appearance.  Chest:     Comments: Bilateral breast examined.  No palpable masses in bilateral breasts or regional adenopathy.  Chronic crusting of right nipple. Musculoskeletal:     Cervical back: Normal range of motion and neck supple. No rigidity.   Lymphadenopathy:     Cervical: No cervical adenopathy.  Neurological:     Mental Status: She is alert.       LAB RESULTS:  CMP     Component Value Date/Time   NA 141 02/17/2022 1022   K 4.0 02/17/2022 1022   CL 106 02/17/2022 1022   CO2 22 02/17/2022 1022   GLUCOSE 96 02/17/2022 1022   BUN 20 02/17/2022 1022   CREATININE 1.01 02/17/2022 1022   CALCIUM 8.8 02/17/2022 1022   PROT 6.7 02/17/2022 1022   ALBUMIN 3.9 06/12/2021 0938   AST 54 (H) 02/17/2022 1022   AST 61 (H) 06/12/2021 0938   ALT 38 (H) 02/17/2022 1022   ALT 43 06/12/2021 0938   ALKPHOS 93 06/12/2021 0938   BILITOT 0.7 02/17/2022 1022   BILITOT 0.5 06/12/2021 0938   GFRNONAA >60 06/12/2021 0938   GFRNONAA 54 (L) 02/19/2020 1023   GFRAA 62 02/19/2020 1023    No results found for: "TOTALPROTELP", "ALBUMINELP", "A1GS", "A2GS", "BETS", "BETA2SER", "GAMS", "MSPIKE", "SPEI"  No results found for: "KPAFRELGTCHN", "LAMBDASER", "KAPLAMBRATIO"  Lab Results  Component Value Date   WBC 6.5 02/17/2022   NEUTROABS 4.4 06/12/2021   HGB 13.1 02/17/2022   HCT 38.6 02/17/2022   MCV 86.0 02/17/2022   PLT 237 02/17/2022   No results found for: "LABCA2"  No components found for: "VPXTGG269"  No results for input(s): "INR" in the last 168 hours.  No results found for: "LABCA2"  No results found for: "SWN462"  No results found for: "CAN125"  No results found for: "CAN153"  No results found for: "CA2729"  No components found for: "HGQUANT"  No results found for: "CEA1", "CEA" / No results found for: "CEA1", "CEA"   No results found for: "AFPTUMOR"  No results found for: "CHROMOGRNA"  No results found for: "HGBA", "HGBA2QUANT", "HGBFQUANT", "HGBSQUAN" (Hemoglobinopathy evaluation)   No results found for: "LDH"  No results found for: "IRON", "TIBC", "IRONPCTSAT" (Iron and TIBC)  No results found for: "FERRITIN"  Urinalysis No results found for: "COLORURINE", "APPEARANCEUR", "LABSPEC", "PHURINE",  "GLUCOSEU", "HGBUR", "BILIRUBINUR", "KETONESUR", "PROTEINUR", "UROBILINOGEN", "NITRITE", "LEUKOCYTESUR"   STUDIES: No results found.    ELIGIBLE FOR AVAILABLE RESEARCH PROTOCOL: [place]  ASSESSMENT: 33 y.Aliene Altes, [place] woman status post right breast upper inner quadrant biopsy 12/09/2018 for a clinical T1c N0, stage IA invasive ductal  carcinoma, grade 2, estrogen and progesterone receptor positive, HER-2 nonamplified, with an MIB-1 of 10%  (1) status post right lumpectomy and sentinel lymph node sampling 01/19/2019 for a pT1c pN0, stage IA invasive ductal carcinoma, grade 2, with close but negative margins and evidence of lymphovascular invasion  (a) a total of 4 right axillary lymph nodes were removed  (2) Oncotype score of 18 predicts a risk of recurrence outside the breast over the next 9 years of 5% if the patient's only systemic therapy is antiestrogens for 5 years.  It also predicts no benefit from chemotherapy.  (3) adjuvant radiation 03/07/19 - 04/21/19: The patient initially received a dose of 50 Gy in 25 fractions to the right breast using whole-breast tangent fields. This was delivered using a 3-D conformal technique. The patient then received a boost to the seroma. This delivered an additional 14.4 Gy in 8 fractions using a 3-field photon boost. The total dose was 64.4 Gy.  (4) tamoxifen started 05/08/2019  (5) genetics testing 04/08/2019 through the Common Hereditary Cancers Panel offered by [facility] found no deleterious mutations in APC, ATM, AXIN2, BARD1, BMPR1A, BRCA1, BRCA2, BRIP1, CDH1, CDK4, CDKN2A (p14ARF), CDKN2A (p16INK4a), CHEK2, CTNNA1, DICER1, EPCAM (Deletion/duplication testing only), GREM1 (promoter region deletion/duplication testing only), KIT, MEN1, MLH1, MSH2, MSH3, MSH6, MUTYH, NBN, NF1, NHTL1, PALB2, PDGFRA, PMS2, POLD1, POLE, PTEN, RAD50, RAD51C, RAD51D, RNF43, SDHB, SDHC, SDHD, SMAD4, SMARCA4. STK11, TP53, TSC1, TSC2, and VHL.  The following genes were  evaluated for sequence changes only: SDHA and HOXB13 c.251G>A variant only.    PLAN:  [name] is doing very well on tamoxifen.   No concerns on ROS or PE findings. Last breast biopsy reviewed, fat necrosis noted. Repeat mammogram due in Dec 2024, this has been ordered. She was instructed to do self breast exam monthly and report any changes to [place] immediately.  She is otherwise recommended to stay active, consider healthy diet and return to clinic in 1 year. Total encounter time 20 minutes.Rachel Moulds, MD   06/15/2022 9:09 AM Medical Oncology and Hematology [facility] [address] [place], [place] [address] Tel. [phone]    Fax. [phone]   *Total Encounter Time as defined by the Centers for Medicare and Medicaid Services includes, in addition to the face-to-face time of a patient visit (documented in the note above) non-face-to-face time: obtaining and reviewing outside history, ordering and reviewing medications, tests or procedures, care coordination (communications with other health care professionals or caregivers) and documentation in the medical record.

## 2022-06-16 ENCOUNTER — Other Ambulatory Visit: Payer: Self-pay | Admitting: Family Medicine

## 2022-06-16 ENCOUNTER — Other Ambulatory Visit (HOSPITAL_BASED_OUTPATIENT_CLINIC_OR_DEPARTMENT_OTHER): Payer: Self-pay

## 2022-06-16 ENCOUNTER — Telehealth: Payer: Self-pay | Admitting: Hematology and Oncology

## 2022-06-16 MED ORDER — FLUOXETINE HCL 10 MG PO CAPS
20.0000 mg | ORAL_CAPSULE | Freq: Every day | ORAL | 1 refills | Status: DC
  Filled 2022-06-25: qty 180, 90d supply, fill #0
  Filled 2022-09-18: qty 180, 90d supply, fill #1

## 2022-06-16 NOTE — Telephone Encounter (Signed)
Left patient a vm regarding next year appointment  

## 2022-06-25 ENCOUNTER — Other Ambulatory Visit (HOSPITAL_BASED_OUTPATIENT_CLINIC_OR_DEPARTMENT_OTHER): Payer: Self-pay

## 2022-06-30 ENCOUNTER — Other Ambulatory Visit (HOSPITAL_BASED_OUTPATIENT_CLINIC_OR_DEPARTMENT_OTHER): Payer: Self-pay

## 2022-08-04 DIAGNOSIS — E559 Vitamin D deficiency, unspecified: Secondary | ICD-10-CM | POA: Diagnosis not present

## 2022-08-07 ENCOUNTER — Other Ambulatory Visit (HOSPITAL_BASED_OUTPATIENT_CLINIC_OR_DEPARTMENT_OTHER): Payer: Self-pay

## 2022-08-07 ENCOUNTER — Other Ambulatory Visit: Payer: Self-pay

## 2022-08-07 ENCOUNTER — Other Ambulatory Visit: Payer: Self-pay | Admitting: Family Medicine

## 2022-08-07 DIAGNOSIS — I10 Essential (primary) hypertension: Secondary | ICD-10-CM

## 2022-08-07 MED ORDER — LOSARTAN POTASSIUM-HCTZ 100-25 MG PO TABS
1.0000 | ORAL_TABLET | Freq: Every day | ORAL | 0 refills | Status: DC
Start: 2022-08-07 — End: 2022-11-07
  Filled 2022-08-07: qty 90, 90d supply, fill #0

## 2022-08-19 ENCOUNTER — Ambulatory Visit: Payer: Self-pay | Admitting: Family Medicine

## 2022-08-22 DIAGNOSIS — G4733 Obstructive sleep apnea (adult) (pediatric): Secondary | ICD-10-CM | POA: Diagnosis not present

## 2022-09-07 ENCOUNTER — Ambulatory Visit: Payer: Commercial Managed Care - PPO | Admitting: Family Medicine

## 2022-09-15 ENCOUNTER — Ambulatory Visit: Payer: Commercial Managed Care - PPO | Admitting: Family Medicine

## 2022-09-15 ENCOUNTER — Encounter: Payer: Self-pay | Admitting: Family Medicine

## 2022-09-15 VITALS — BP 124/63 | HR 88 | Ht 61.0 in | Wt 169.0 lb

## 2022-09-15 DIAGNOSIS — I10 Essential (primary) hypertension: Secondary | ICD-10-CM

## 2022-09-15 DIAGNOSIS — F439 Reaction to severe stress, unspecified: Secondary | ICD-10-CM | POA: Diagnosis not present

## 2022-09-15 DIAGNOSIS — G4733 Obstructive sleep apnea (adult) (pediatric): Secondary | ICD-10-CM

## 2022-09-15 NOTE — Assessment & Plan Note (Signed)
Q9 and GAD-7 scores look fantastic today.  Continue current regimen.

## 2022-09-15 NOTE — Assessment & Plan Note (Signed)
Well controlled. Continue current regimen. Follow up in  6 mo  

## 2022-09-15 NOTE — Progress Notes (Signed)
   Established Patient Office Visit  Subjective   Patient ID: [name], female    DOB: Dec 14, 1965  Age: 57 y.o. MRN: [id number]  Chief Complaint  Patient presents with   Hypertension    Requesting rx rf  Sent request for release of records from Dr. Raynaldo Opitz office via my chart for recent pap   Depression    Requesting rx rf    HPI  Hypertension- Pt denies chest pain, SOB, dizziness, or heart palpitations.  Taking meds as directed w/o problems.  Denies medication side effects.    F/U OSA -using her CPAP regularly and doing well with it no specific concerns or problems.  Depression-overall she is doing really well on her medication.  She does notice a slight bump up and irritability but she feels like that is more menopause than anything.    ROS    Objective:     BP 124/63   Pulse 88   Ht 5\' 1"  (1.549 m)   Wt 169 lb (76.7 kg)   SpO2 98%   BMI 31.93 kg/m    Physical Exam Vitals and nursing note reviewed.  Constitutional:      Appearance: She is well-developed.  HENT:     Head: Normocephalic and atraumatic.  Cardiovascular:     Rate and Rhythm: Normal rate and regular rhythm.     Heart sounds: Normal heart sounds.  Pulmonary:     Effort: Pulmonary effort is normal.     Breath sounds: Normal breath sounds.  Skin:    General: Skin is warm and dry.  Neurological:     Mental Status: She is alert and oriented to person, place, and time.  Psychiatric:        Behavior: Behavior normal.      No results found for any visits on 09/15/22.    The 10-year ASCVD risk score (Arnett DK, et al., 2019) is: 3.2%    Assessment & Plan:   Problem List Items Addressed This Visit       Cardiovascular and Mediastinum   Essential hypertension - Primary    Well controlled. Continue current regimen. Follow up in  6 mo       Relevant Orders   BASIC METABOLIC PANEL WITH GFR     Respiratory   OSA (obstructive sleep apnea)    Using her CPAP consistently not  having any problems concerns or side effects.        Other   Stress    Q9 and GAD-7 scores look fantastic today.  Continue current regimen.       Sees Dr. Candice Camp for her gynecologic care.  Will call for her last Pap smear.  Return in about 6 months (around 03/18/2023) for Hypertension.    Nani Gasser, MD

## 2022-09-15 NOTE — Assessment & Plan Note (Signed)
Using her CPAP consistently not having any problems concerns or side effects.

## 2022-09-16 LAB — BASIC METABOLIC PANEL WITH GFR
BUN: 21 mg/dL (ref 7–25)
CO2: 24 mmol/L (ref 20–32)
Calcium: 9.2 mg/dL (ref 8.6–10.4)
Chloride: 106 mmol/L (ref 98–110)
Creat: 0.99 mg/dL (ref 0.50–1.03)
Glucose, Bld: 105 mg/dL — ABNORMAL HIGH (ref 65–99)
Potassium: 3.7 mmol/L (ref 3.5–5.3)
Sodium: 141 mmol/L (ref 135–146)
eGFR: 67 mL/min/{1.73_m2} (ref >=60)

## 2022-09-16 NOTE — Progress Notes (Signed)
Your lab work is within acceptable range and there are no concerning findings.   ?

## 2022-09-30 ENCOUNTER — Telehealth: Payer: Commercial Managed Care - PPO | Admitting: Family Medicine

## 2022-09-30 DIAGNOSIS — H9220 Otorrhagia, unspecified ear: Secondary | ICD-10-CM

## 2022-09-30 NOTE — Progress Notes (Signed)
Because due to some bleeding or blood coming from the ear- someone needs to look in your ear to make sure it is ok- that will also let us know if you need drops vs pills for ear infection- if that is needed, I feel your condition warrants further evaluation and I recommend that you be seen in a face to face visit.   NOTE: There will be NO CHARGE for this eVisit

## 2022-10-03 ENCOUNTER — Other Ambulatory Visit: Payer: Self-pay

## 2022-10-03 ENCOUNTER — Ambulatory Visit
Admission: EM | Admit: 2022-10-03 | Discharge: 2022-10-03 | Disposition: A | Discharge status: Home / Self Care | Payer: Commercial Managed Care - PPO | Attending: Family Medicine | Admitting: Family Medicine

## 2022-10-03 DIAGNOSIS — M65831 Other synovitis and tenosynovitis, right forearm: Secondary | ICD-10-CM | POA: Diagnosis not present

## 2022-10-03 DIAGNOSIS — M25531 Pain in right wrist: Secondary | ICD-10-CM | POA: Diagnosis not present

## 2022-10-03 MED ORDER — PREDNISONE 10 MG (21) PO TBPK: ORAL_TABLET | Freq: Every day | ORAL | 0 refills | Status: DC

## 2022-10-03 NOTE — Discharge Instructions (Addendum)
Advised patient to wear right wrist brace 24/7 (except when bathing) for the next 2 weeks.  Advised patient to take medication as directed with food to completion.  Encouraged increase daily water intake to 64 ounces per day while taking this medication.  Advised if symptoms worsen and/or unresolved please follow-up with PCP, [facility], or here for further evaluation.

## 2022-10-03 NOTE — ED Provider Notes (Signed)
[name]    CSN: [id number] Arrival date & time: 10/03/22  1507      History   Chief Complaint Chief Complaint  Patient presents with   Wrist Pain    RT    HPI Aracelis Handelsman is a 57 y.o. female.   HPI Pleasant 57 year old female presents with right wrist pain for 2 days.  Denies injury or insult to right wrist.  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  Patient is accompanied by her husband this afternoon.  Additionally, patient reports right ear pain for 2 to 3 days.  Patient is currently wearing bilateral hearing aids. PMH significant for goiter, HTN, and malignant neoplasm of right breast.  Past Medical History:  Diagnosis Date   Cancer (HCC)    Eczema    legs   Family history of brain cancer    Family history of breast cancer    Family history of lung cancer    Family history of ovarian cancer    Hallux valgus 06/2012   left   Hammertoe 06/2012   left second   Heart murmur    since birth, has had no testing, no problems   History of frequent ear infections    Hypertension    under control with med., has been on med. x 3 yr.   Irritable bowel syndrome (IBS)    no current med.   Personal history of radiation therapy    Sleep apnea    Wears hearing aid    bilateral    Patient Active Problem List   Diagnosis Date Noted   History of therapeutic radiation 02/06/2020   Stress 04/26/2019   Genetic testing 04/08/2019   Family history of ovarian cancer    Family history of brain cancer    Family history of lung cancer    Family history of breast cancer    Malignant neoplasm of upper-inner quadrant of right breast in female, estrogen receptor positive (HCC) 12/13/2018   Irritable bowel syndrome 11/30/2018   Hypertriglyceridemia 09/30/2015   OSA (obstructive sleep apnea) 10/25/2013   Hearing loss 05/28/2011   Essential hypertension 09/03/2008   GOITER 07/14/2007    Past Surgical History:  Procedure Laterality Date    BREAST BIOPSY Right 12/22/2018   BREAST BIOPSY Left 03/13/2022   MM LT BREAST BX W LOC DEV EA AD LESION IMG BX SPEC STEREO GUIDE 03/13/2022 [facility]   BREAST BIOPSY Left 03/13/2022   MM LT BREAST BX W LOC DEV 1ST LESION IMAGE BX SPEC STEREO GUIDE 03/13/2022 [facility]   BREAST LUMPECTOMY Right 01/19/2019   BREAST LUMPECTOMY WITH RADIOACTIVE SEED AND SENTINEL LYMPH NODE BIOPSY Right 01/19/2019   Procedure: RIGHT BREAST LUMPECTOMY WITH RADIOACTIVE SEED AND SENTINEL LYMPH NODE BIOPSY;  Surgeon: Almond Lint, MD;  Location: [facility];  Service: General;  Laterality: Right;   EXPLORATORY TYMPANOTOMY Left 06/25/2000   with lysis of adhesions   HAMMER TOE SURGERY Left 06/16/2012   Procedure: LEFT SCARF MODIFIED MCBRIDE AND SECOND Metatarsal WEIL;  Surgeon: Toni Arthurs, MD;  Location: [facility];  Service: Orthopedics;  Laterality: Left;   MASTOPEXY Left 03/19/2020   Procedure: MASTOPEXY;  Surgeon: Glenna Fellows, MD;  Location: [facility];  Service: Plastics;  Laterality: Left;   TONSILLECTOMY     TYMPANOPLASTY Left 06/25/2000   TYMPANOSTOMY TUBE PLACEMENT  10/10/2009    OB History   No obstetric history on file.      Home  Medications    Prior to Admission medications   Medication Sig Start Date End Date Taking? Authorizing Provider  predniSONE (STERAPRED UNI-PAK 21 TAB) 10 MG (21) TBPK tablet Take by mouth daily. Take 6 tabs by mouth daily  for 2 days, then 5 tabs for 2 days, then 4 tabs for 2 days, then 3 tabs for 2 days, 2 tabs for 2 days, then 1 tab by mouth daily for 2 days 10/03/22  Yes Trevor Iha, FNP  AMBULATORY NON FORMULARY MEDICATION Medication Name: New CPAP machine. Old machine broken. Set  to 17 cm water pressure. Fax to [facility] [phone]. 05/12/21   Agapito Games, MD  Cholecalciferol 1.25 MG (50000 UT) capsule Take 1 capsule (50,000 Units total) by mouth once a week. Patient taking differently: Take 50,000 Units by mouth once a week.  Now using OTC supplement but does not remember the strength she is taking 04/01/22     FLUoxetine (PROZAC) 10 MG capsule Take 2 capsules (20 mg total) by mouth daily. 06/16/22 06/16/23  Agapito Games, MD  losartan-hydrochlorothiazide (HYZAAR) 100-25 MG tablet Take 1 tablet by mouth daily. 08/07/22   Agapito Games, MD  medroxyPROGESTERone (PROVERA) 10 MG tablet Take 1 tablet (10 mg total) by mouth daily for 10 days as needed every 2 months if no menses. 04/01/22     tamoxifen (NOLVADEX) 20 MG tablet Take 1 tablet (20 mg total) by mouth daily. 05/12/22   Rachel Moulds, MD    Family History Family History  Problem Relation Age of Onset   Alzheimer's disease Mother    Brain cancer Father 68       Glioblastoma multiforma.    Ovarian cancer Paternal Aunt        dx. 40s or 50s   Lung cancer Other        dx. 74s (maternal great-aunt)    Social History Social History   Tobacco Use   Smoking status: Never   Smokeless tobacco: Never  Vaping Use   Vaping status: Never Used  Substance Use Topics   Alcohol use: Not Currently    Comment: occasionally   Drug use: No     Allergies   Erythromycin, Nitrofurantoin, Lexapro [escitalopram oxalate], and Lisinopril   Review of Systems Review of Systems  Musculoskeletal:        Right wrist pain for 2 days  All other systems reviewed and are negative.    Physical Exam Triage Vital Signs ED Triage Vitals  Encounter Vitals Group     BP 10/03/22 1517 (!) 147/82     Systolic BP Percentile --      Diastolic BP Percentile --      Pulse Rate 10/03/22 1517 (!) 115     Resp 10/03/22 1517 17     Temp 10/03/22 1517 98.1 F (36.7 C)     Temp Source 10/03/22 1517 Oral     SpO2 10/03/22 1517 96 %     Weight --      Height --      Head Circumference --      Peak Flow --      Pain Score 10/03/22 1518 4     Pain Loc --      Pain Education --      Exclude from Growth Chart --    No data found.  Updated Vital Signs BP (!) 147/82  (BP Location: Right Arm)   Pulse (!) 115   Temp 98.1 F (36.7 C) (Oral)  Resp 17   SpO2 96%    Physical Exam Vitals and nursing note reviewed.  Constitutional:      Appearance: Normal appearance. She is obese.  HENT:     Head: Normocephalic and atraumatic.     Right Ear: Tympanic membrane, ear canal and external ear normal.     Left Ear: Tympanic membrane, ear canal and external ear normal.     Mouth/Throat:     Mouth: Mucous membranes are moist.     Pharynx: Oropharynx is clear.  Eyes:     Extraocular Movements: Extraocular movements intact.     Conjunctiva/sclera: Conjunctivae normal.     Pupils: Pupils are equal, round, and reactive to light.  Cardiovascular:     Rate and Rhythm: Normal rate and regular rhythm.     Pulses: Normal pulses.     Heart sounds: Normal heart sounds.  Pulmonary:     Effort: Pulmonary effort is normal.     Breath sounds: Normal breath sounds.  Musculoskeletal:        General: Normal range of motion.     Cervical back: Normal range of motion and neck supple.     Comments: Right wrist (dorsum): TTP with mild soft tissue swelling, positive Finkelstein's, positive ulnar deviation, positive Tinel's sign, grip is 5/5, neurovascular/neurosensory intact, brisk cap refill  Skin:    General: Skin is warm and dry.  Neurological:     General: No focal deficit present.     Mental Status: She is alert and oriented to person, place, and time. Mental status is at baseline.  Psychiatric:        Mood and Affect: Mood normal.        Behavior: Behavior normal.        Thought Content: Thought content normal.      UC Treatments / Results  Labs (all labs ordered are listed, but only abnormal results are displayed) Labs Reviewed - No data to display  EKG   Radiology No results found.  Procedures Procedures (including critical care time)  Medications Ordered in UC Medications - No data to display  Initial Impression / Assessment and Plan / UC Course   I have reviewed the triage vital signs and the nursing notes.  Pertinent labs & imaging results that were available during my care of the patient were reviewed by me and considered in my medical decision making (see chart for details).     MDM: 1.  Right wrist pain-right wrist brace (quick fit right wrist brace) placed on right wrist prior to discharge; 2.  Extensor tenosynovitis of wrist, right-Rx'd Sterapred Unipak (tapering from 60 mg to 10 mg over 10 days). Advised patient to wear right wrist brace 24/7 (except when bathing) for the next 2 weeks.  Advised patient to take medication as directed with food to completion.  Encouraged increase daily water intake to 64 ounces per day while taking this medication.  Advised if symptoms worsen and/or unresolved please follow-up with PCP, [facility], or here for further evaluation.  Patient discharged home, hemodynamically stable. Final Clinical Impressions(s) / UC Diagnoses   Final diagnoses:  Right wrist pain  Extensor tenosynovitis of wrist, right     Discharge Instructions      Advised patient to wear right wrist brace 24/7 (except when bathing) for the next 2 weeks.  Advised patient to take medication as directed with food to completion.  Encouraged increase daily water intake to 64 ounces per day while taking this medication.  Advised if symptoms worsen and/or unresolved please follow-up with PCP, [facility], or here for further evaluation.     ED Prescriptions     Medication Sig Dispense Auth. Provider   predniSONE (STERAPRED UNI-PAK 21 TAB) 10 MG (21) TBPK tablet Take by mouth daily. Take 6 tabs by mouth daily  for 2 days, then 5 tabs for 2 days, then 4 tabs for 2 days, then 3 tabs for 2 days, 2 tabs for 2 days, then 1 tab by mouth daily for 2 days 42 tablet Trevor Iha, FNP      PDMP not reviewed this encounter.   Trevor Iha, FNP 10/03/22 [phone]

## 2022-10-03 NOTE — ED Triage Notes (Signed)
Pt c/o RT wrist pain x 2 days ago. Denies injury. Some swelling. Hurts worse if she tries to grip something. Motrin prn. Does work at [employer] for her job.

## 2022-10-08 ENCOUNTER — Encounter: Payer: Self-pay | Admitting: Family Medicine

## 2022-11-07 ENCOUNTER — Other Ambulatory Visit: Payer: Self-pay | Admitting: Family Medicine

## 2022-11-07 DIAGNOSIS — I10 Essential (primary) hypertension: Secondary | ICD-10-CM

## 2022-11-10 ENCOUNTER — Other Ambulatory Visit: Payer: Self-pay

## 2022-11-11 ENCOUNTER — Other Ambulatory Visit (HOSPITAL_BASED_OUTPATIENT_CLINIC_OR_DEPARTMENT_OTHER): Payer: Self-pay

## 2022-11-11 MED ORDER — LOSARTAN POTASSIUM-HCTZ 100-25 MG PO TABS
1.0000 | ORAL_TABLET | Freq: Every day | ORAL | 0 refills | Status: DC
Start: 2022-11-11 — End: 2023-02-02
  Filled 2022-11-11: qty 90, 90d supply, fill #0

## 2022-11-23 DIAGNOSIS — G4733 Obstructive sleep apnea (adult) (pediatric): Secondary | ICD-10-CM | POA: Diagnosis not present

## 2022-11-30 ENCOUNTER — Telehealth: Payer: Commercial Managed Care - PPO | Admitting: Nurse Practitioner

## 2022-11-30 ENCOUNTER — Other Ambulatory Visit (HOSPITAL_BASED_OUTPATIENT_CLINIC_OR_DEPARTMENT_OTHER): Payer: Self-pay

## 2022-11-30 DIAGNOSIS — H60313 Diffuse otitis externa, bilateral: Secondary | ICD-10-CM

## 2022-11-30 MED ORDER — CIPROFLOXACIN-DEXAMETHASONE 0.3-0.1 % OT SUSP
4.0000 [drp] | Freq: Two times a day (BID) | OTIC | 0 refills | Status: AC
Start: 2022-11-30 — End: 2022-12-07
  Filled 2022-11-30: qty 7.5, 7d supply, fill #0

## 2022-11-30 NOTE — Progress Notes (Signed)
E-Visit for Ear Pain - Acute Otitis Media   We are sorry that you are not feeling well. Here is how we plan to help!  Based on what you have shared with me it looks like you have Acute Otitis Media.  Acute Otitis Media is an infection of the middle or "inner" ear. This type of infection can cause redness, inflammation, and fluid buildup behind the tympanic membrane (ear drum).  The usual symptoms include: Earache/Pain Fever Upper respiratory symptoms Lack of energy/Fatigue/Malaise Slight hearing loss gradually worsening- if the inner ear fills with fluid What causes middle ear infections? Most middle ear infections occur when an infection such as a cold, leads to a build-up of mucus in the middle ear and causes the Eustachian tube (a thin tube that runs from the middle ear to the back of the nose) to become swollen or blocked.   This means mucus can't drain away properly, making it easier for an infection to spread into the middle ear.  How middle ear infections are treated: Most ear infections clear up within three to five days and don't need any specific treatment. If necessary, tylenol or ibuprofen should be used to relieve pain and a high temperature.  If you develop a fever higher than 102, or any significantly worsening symptoms, this could indicate a more serious infection moving to the middle/inner and needs face to face evaluation in an office by a provider.   Antibiotics aren't routinely used to treat middle ear infections, although they may occasionally be prescribed if symptoms persist or are particularly severe. Given your presentation,     I have prescribed:  Meds ordered this encounter  Medications   ciprofloxacin-dexamethasone (CIPRODEX) OTIC suspension    Sig: Place 4 drops into both ears 2 (two) times daily for 7 days.    Dispense:  7.5 mL    Refill:  0    Your symptoms should improve over the next 3 days and should resolve in about 7 days. Be sure to complete ALL  of the prescription(s) given.  HOME CARE: Wash your hands frequently. If you are prescribed an ear drop, do not place the tip of the bottle on your ear or touch it with your fingers. You can take Acetaminophen 650 mg every 4-6 hours as needed for pain.  If pain is severe or moderate, you can apply a heating pad (set on low) or hot water bottle (wrapped in a towel) to outer ear for 20 minutes.  This will also increase drainage.  GET HELP RIGHT AWAY IF: Fever is over 102.2 degrees. You develop progressive ear pain or hearing loss. Ear symptoms persist longer than 3 days after treatment.  MAKE SURE YOU: Understand these instructions. Will watch your condition. Will get help right away if you are not doing well or get worse.  Thank you for choosing an e-visit.  Your e-visit answers were reviewed by a board certified advanced clinical practitioner to complete your personal care plan. Depending upon the condition, your plan could have included both over the counter or prescription medications.  Please review your pharmacy choice. Make sure the pharmacy is open so you can pick up the prescription now. If there is a problem, you may contact your provider through [employer] and have the prescription routed to another pharmacy.  Your safety is important to [place]. If you have drug allergies check your prescription carefully.   For the next 24 hours you can use MyChart to ask questions about today's  visit, request a non-urgent call back, or ask for a work or school excuse. You will get an email with a survey after your eVisit asking about your experience. We would appreciate your feedback. I hope that your e-visit has been valuable and will aid in your recovery.  I spent approximately 5 minutes reviewing the patient's history, current symptoms and coordinating their care today.

## 2022-12-15 ENCOUNTER — Other Ambulatory Visit: Payer: Self-pay | Admitting: Family Medicine

## 2022-12-18 ENCOUNTER — Other Ambulatory Visit (HOSPITAL_BASED_OUTPATIENT_CLINIC_OR_DEPARTMENT_OTHER): Payer: Self-pay

## 2022-12-18 MED ORDER — FLUOXETINE HCL 10 MG PO CAPS
20.0000 mg | ORAL_CAPSULE | Freq: Every day | ORAL | 0 refills | Status: DC
  Filled 2022-12-18: qty 180, 90d supply, fill #0

## 2023-02-02 ENCOUNTER — Other Ambulatory Visit: Payer: Self-pay | Admitting: Family Medicine

## 2023-02-02 DIAGNOSIS — I10 Essential (primary) hypertension: Secondary | ICD-10-CM

## 2023-02-03 ENCOUNTER — Other Ambulatory Visit (HOSPITAL_BASED_OUTPATIENT_CLINIC_OR_DEPARTMENT_OTHER): Payer: Self-pay

## 2023-02-03 ENCOUNTER — Other Ambulatory Visit: Payer: Self-pay

## 2023-02-03 MED ORDER — LOSARTAN POTASSIUM-HCTZ 100-25 MG PO TABS
1.0000 | ORAL_TABLET | Freq: Every day | ORAL | 0 refills | Status: DC
  Filled 2023-02-03: qty 90, 90d supply, fill #0

## 2023-02-23 DIAGNOSIS — G4733 Obstructive sleep apnea (adult) (pediatric): Secondary | ICD-10-CM | POA: Diagnosis not present

## 2023-03-08 ENCOUNTER — Ambulatory Visit
Admission: RE | Admit: 2023-03-08 | Discharge: 2023-03-08 | Disposition: A | Discharge status: Home / Self Care | Payer: Commercial Managed Care - PPO | Source: Ambulatory Visit | Attending: Hematology and Oncology | Admitting: Hematology and Oncology

## 2023-03-08 ENCOUNTER — Other Ambulatory Visit: Payer: Self-pay | Admitting: Hematology and Oncology

## 2023-03-08 DIAGNOSIS — Z1231 Encounter for screening mammogram for malignant neoplasm of breast: Secondary | ICD-10-CM | POA: Diagnosis not present

## 2023-03-08 DIAGNOSIS — Z170 Estrogen receptor positive status [ER+]: Secondary | ICD-10-CM

## 2023-03-18 ENCOUNTER — Other Ambulatory Visit (HOSPITAL_BASED_OUTPATIENT_CLINIC_OR_DEPARTMENT_OTHER): Payer: Self-pay

## 2023-03-18 ENCOUNTER — Ambulatory Visit: Payer: Commercial Managed Care - PPO | Admitting: Family Medicine

## 2023-03-18 VITALS — BP 127/72 | HR 100 | Ht 61.0 in | Wt 169.0 lb

## 2023-03-18 DIAGNOSIS — Z170 Estrogen receptor positive status [ER+]: Secondary | ICD-10-CM

## 2023-03-18 DIAGNOSIS — I10 Essential (primary) hypertension: Secondary | ICD-10-CM

## 2023-03-18 DIAGNOSIS — E049 Nontoxic goiter, unspecified: Secondary | ICD-10-CM

## 2023-03-18 DIAGNOSIS — C50211 Malignant neoplasm of upper-inner quadrant of right female breast: Secondary | ICD-10-CM | POA: Diagnosis not present

## 2023-03-18 DIAGNOSIS — F439 Reaction to severe stress, unspecified: Secondary | ICD-10-CM | POA: Diagnosis not present

## 2023-03-18 DIAGNOSIS — R748 Abnormal levels of other serum enzymes: Secondary | ICD-10-CM

## 2023-03-18 DIAGNOSIS — Z23 Encounter for immunization: Secondary | ICD-10-CM

## 2023-03-18 DIAGNOSIS — E781 Pure hyperglyceridemia: Secondary | ICD-10-CM | POA: Diagnosis not present

## 2023-03-18 MED ORDER — LOSARTAN POTASSIUM-HCTZ 100-25 MG PO TABS
1.0000 | ORAL_TABLET | Freq: Every day | ORAL | 1 refills | Status: DC
  Filled 2023-03-18 – 2023-05-06 (×2): qty 90, 90d supply, fill #0
  Filled 2023-08-04: qty 90, 90d supply, fill #1

## 2023-03-18 MED ORDER — FLUOXETINE HCL 10 MG PO CAPS
20.0000 mg | ORAL_CAPSULE | Freq: Every day | ORAL | 1 refills | Status: DC
  Filled 2023-03-18: qty 180, 90d supply, fill #0
  Filled 2023-06-18: qty 180, 90d supply, fill #1

## 2023-03-18 NOTE — Assessment & Plan Note (Signed)
 She still has 1 more year on the tamoxifen.

## 2023-03-18 NOTE — Assessment & Plan Note (Addendum)
 PHQ 9 score of 0 and GAD-7 score of 0.  Continue current regimen.  Follow-up in 6 months.

## 2023-03-18 NOTE — Progress Notes (Addendum)
 Established Patient Office Visit  Subjective  Patient ID: [name], female    DOB: 1965/06/21  Age: 58 y.o. MRN: [id number]  Chief Complaint  Patient presents with   Hypertension    HPI  Hypertension- Pt denies chest pain, SOB, dizziness, or heart palpitations.  Taking meds as directed w/o problems.  Denies medication side effects.    Mood has been good and well-controlled on her current dose of fluoxetine  she is happy with her regimen and does not want to make any changes today.  She is going to have her second grandbaby this spring.  She is quite excited.  She and her husband did start going back to the gym it was there New Year's resolution her goal is to go 3 times per week they are just starting with 20 minutes on the treadmill and trying to build back up.  She did go for her mammogram at the end of December and has her GYN appointment at the end of this month.     ROS    Objective:     BP 127/72   Pulse 100   Ht 5' 1 (1.549 m)   Wt 169 lb (76.7 kg)   SpO2 99%   BMI 31.93 kg/m    Physical Exam Vitals and nursing note reviewed.  Constitutional:      Appearance: Normal appearance.  HENT:     Head: Normocephalic and atraumatic.  Eyes:     Conjunctiva/sclera: Conjunctivae normal.  Cardiovascular:     Rate and Rhythm: Normal rate and regular rhythm.  Pulmonary:     Effort: Pulmonary effort is normal.     Breath sounds: Normal breath sounds.  Skin:    General: Skin is warm and dry.  Neurological:     Mental Status: She is alert.  Psychiatric:        Mood and Affect: Mood normal.      Results for orders placed or performed in visit on 03/18/23  CMP14+EGFR  Result Value Ref Range   Glucose 117 (H) 70 - 99 mg/dL   BUN 16 6 - 24 mg/dL   Creatinine, Ser 8.86 (H) 0.57 - 1.00 mg/dL   eGFR 56 (L) >40 fO/fpw/8.26   BUN/Creatinine Ratio 14 9 - 23   Sodium 142 134 - 144 mmol/L   Potassium 3.7 3.5 - 5.2 mmol/L   Chloride 106 96 - 106 mmol/L    CO2 20 20 - 29 mmol/L   Calcium 8.8 8.7 - 10.2 mg/dL   Total Protein 6.7 6.0 - 8.5 g/dL   Albumin 4.0 3.8 - 4.9 g/dL   Globulin, Total 2.7 1.5 - 4.5 g/dL   Bilirubin Total 0.4 0.0 - 1.2 mg/dL   Alkaline Phosphatase 89 44 - 121 IU/L   AST 74 (H) 0 - 40 IU/L   ALT 41 (H) 0 - 32 IU/L  Lipid Panel With LDL/HDL Ratio  Result Value Ref Range   Cholesterol, Total 173 100 - 199 mg/dL   Triglycerides 879 0 - 149 mg/dL   HDL 43 >60 mg/dL   VLDL Cholesterol Cal 22 5 - 40 mg/dL   LDL Chol Calc (NIH) 891 (H) 0 - 99 mg/dL   LDL/HDL Ratio 2.5 0.0 - 3.2 ratio  TSH  Result Value Ref Range   TSH 3.870 0.450 - 4.500 uIU/mL  CBC with Differential/Platelet  Result Value Ref Range   WBC 7.5 3.4 - 10.8 x10E3/uL   RBC 4.52 3.77 - 5.28 x10E6/uL   Hemoglobin  12.8 11.1 - 15.9 g/dL   Hematocrit 61.1 65.9 - 46.6 %   MCV 86 79 - 97 fL   MCH 28.3 26.6 - 33.0 pg   MCHC 33.0 31.5 - 35.7 g/dL   RDW 86.5 88.2 - 84.5 %   Platelets 210 150 - 450 x10E3/uL   Neutrophils 64 Not Estab. %   Lymphs 24 Not Estab. %   Monocytes 6 Not Estab. %   Eos 5 Not Estab. %   Basos 1 Not Estab. %   Neutrophils Absolute 4.8 1.4 - 7.0 x10E3/uL   Lymphocytes Absolute 1.8 0.7 - 3.1 x10E3/uL   Monocytes Absolute 0.5 0.1 - 0.9 x10E3/uL   EOS (ABSOLUTE) 0.4 0.0 - 0.4 x10E3/uL   Basophils Absolute 0.1 0.0 - 0.2 x10E3/uL   Immature Granulocytes 0 Not Estab. %   Immature Grans (Abs) 0.0 0.0 - 0.1 x10E3/uL      The 10-year ASCVD risk score (Arnett DK, et al., 2019) is: 3.8%    Assessment & Plan:   Problem List Items Addressed This Visit       Cardiovascular and Mediastinum   Essential hypertension - Primary   Relevant Medications   losartan -hydrochlorothiazide  (HYZAAR ) 100-25 MG tablet   Other Relevant Orders   CMP14+EGFR (Completed)   Lipid Panel With LDL/HDL Ratio (Completed)   TSH (Completed)   CBC with Differential/Platelet (Completed)     Endocrine   Goiter   Relevant Orders   CMP14+EGFR (Completed)    Lipid Panel With LDL/HDL Ratio (Completed)   TSH (Completed)   CBC with Differential/Platelet (Completed)     Other   Stress   PHQ 9 score of 0 and GAD-7 score of 0.  Continue current regimen.  Follow-up in 6 months.      Malignant neoplasm of upper-inner quadrant of right breast in female, estrogen receptor positive (HCC)   She still has 1 more year on the tamoxifen .      Hypertriglyceridemia   Relevant Medications   losartan -hydrochlorothiazide  (HYZAAR ) 100-25 MG tablet   Other Relevant Orders   CMP14+EGFR (Completed)   Lipid Panel With LDL/HDL Ratio (Completed)   TSH (Completed)   CBC with Differential/Platelet (Completed)   Other Visit Diagnoses       Encounter for immunization       Relevant Orders   Pneumococcal conjugate vaccine 20-valent (Completed)     Elevated liver enzymes       Relevant Orders   US  ABDOMEN COMPLETE W/ELASTOGRAPHY       Prevnar 20 given today.  Return in about 6 months (around 09/22/2023).    Judy Byars, MD

## 2023-03-19 LAB — CBC WITH DIFFERENTIAL/PLATELET
Basophils Absolute: 0.1 10*3/uL (ref 0.0–0.2)
Basos: 1 %
EOS (ABSOLUTE): 0.4 10*3/uL (ref 0.0–0.4)
Eos: 5 %
Hematocrit: 38.8 % (ref 34.0–46.6)
Hemoglobin: 12.8 g/dL (ref 11.1–15.9)
Immature Grans (Abs): 0 10*3/uL (ref 0.0–0.1)
Immature Granulocytes: 0 %
Lymphocytes Absolute: 1.8 10*3/uL (ref 0.7–3.1)
Lymphs: 24 %
MCH: 28.3 pg (ref 26.6–33.0)
MCHC: 33 g/dL (ref 31.5–35.7)
MCV: 86 fL (ref 79–97)
Monocytes Absolute: 0.5 10*3/uL (ref 0.1–0.9)
Monocytes: 6 %
Neutrophils Absolute: 4.8 10*3/uL (ref 1.4–7.0)
Neutrophils: 64 %
Platelets: 210 10*3/uL (ref 150–450)
RBC: 4.52 x10E6/uL (ref 3.77–5.28)
RDW: 13.4 % (ref 11.7–15.4)
WBC: 7.5 10*3/uL (ref 3.4–10.8)

## 2023-03-19 LAB — CMP14+EGFR
ALT: 41 [IU]/L — ABNORMAL HIGH (ref 0–32)
AST: 74 [IU]/L — ABNORMAL HIGH (ref 0–40)
Albumin: 4 g/dL (ref 3.8–4.9)
Alkaline Phosphatase: 89 [IU]/L (ref 44–121)
BUN/Creatinine Ratio: 14 (ref 9–23)
BUN: 16 mg/dL (ref 6–24)
Bilirubin Total: 0.4 mg/dL (ref 0.0–1.2)
CO2: 20 mmol/L (ref 20–29)
Calcium: 8.8 mg/dL (ref 8.7–10.2)
Chloride: 106 mmol/L (ref 96–106)
Creatinine, Ser: 1.13 mg/dL — ABNORMAL HIGH (ref 0.57–1.00)
Globulin, Total: 2.7 g/dL (ref 1.5–4.5)
Glucose: 117 mg/dL — ABNORMAL HIGH (ref 70–99)
Potassium: 3.7 mmol/L (ref 3.5–5.2)
Sodium: 142 mmol/L (ref 134–144)
Total Protein: 6.7 g/dL (ref 6.0–8.5)
eGFR: 56 mL/min/{1.73_m2} — ABNORMAL LOW (ref >59)

## 2023-03-19 LAB — LIPID PANEL WITH LDL/HDL RATIO
Cholesterol, Total: 173 mg/dL (ref 100–199)
HDL: 43 mg/dL (ref >39)
LDL Chol Calc (NIH): 108 mg/dL — ABNORMAL HIGH (ref 0–99)
LDL/HDL Ratio: 2.5 {ratio} (ref 0.0–3.2)
Triglycerides: 120 mg/dL (ref 0–149)
VLDL Cholesterol Cal: 22 mg/dL (ref 5–40)

## 2023-03-19 LAB — TSH: TSH: 3.87 u[IU]/mL (ref 0.450–4.500)

## 2023-03-19 NOTE — Progress Notes (Signed)
 Kim, kidney function is up a little bit at 1.1 I think your baseline is around 0.9-1.0 but nothing worrisome.  Liver enzymes are still elevated.  Order an ultrasound on your liver as they have been up-and-down over the last couple years.  But this time it has been persistently elevated.  The LDL cholesterol is up just a little.  Just encourage you to continue to work on healthy diet and regular exercise.  Blood count and thyroid  are normal.

## 2023-03-19 NOTE — Addendum Note (Signed)
 Addended by: Nani Gasser D on: 03/19/2023 08:45 AM   Modules accepted: Orders

## 2023-04-06 DIAGNOSIS — N912 Amenorrhea, unspecified: Secondary | ICD-10-CM | POA: Diagnosis not present

## 2023-04-06 DIAGNOSIS — Z01419 Encounter for gynecological examination (general) (routine) without abnormal findings: Secondary | ICD-10-CM | POA: Diagnosis not present

## 2023-04-06 DIAGNOSIS — Z124 Encounter for screening for malignant neoplasm of cervix: Secondary | ICD-10-CM | POA: Diagnosis not present

## 2023-04-06 DIAGNOSIS — Z6833 Body mass index (BMI) 33.0-33.9, adult: Secondary | ICD-10-CM | POA: Diagnosis not present

## 2023-04-06 DIAGNOSIS — Z853 Personal history of malignant neoplasm of breast: Secondary | ICD-10-CM | POA: Diagnosis not present

## 2023-04-15 ENCOUNTER — Other Ambulatory Visit (HOSPITAL_BASED_OUTPATIENT_CLINIC_OR_DEPARTMENT_OTHER): Payer: Self-pay

## 2023-04-15 ENCOUNTER — Telehealth: Payer: Commercial Managed Care - PPO | Admitting: Physician Assistant

## 2023-04-15 DIAGNOSIS — J208 Acute bronchitis due to other specified organisms: Secondary | ICD-10-CM

## 2023-04-15 DIAGNOSIS — B9689 Other specified bacterial agents as the cause of diseases classified elsewhere: Secondary | ICD-10-CM

## 2023-04-15 MED ORDER — DOXYCYCLINE HYCLATE 100 MG PO TABS
100.0000 mg | ORAL_TABLET | Freq: Two times a day (BID) | ORAL | 0 refills | Status: DC
  Filled 2023-04-15: qty 14, 7d supply, fill #0

## 2023-04-15 MED ORDER — BENZONATATE 100 MG PO CAPS
100.0000 mg | ORAL_CAPSULE | Freq: Three times a day (TID) | ORAL | 0 refills | Status: DC | PRN
  Filled 2023-04-15: qty 30, 10d supply, fill #0

## 2023-04-15 NOTE — Progress Notes (Signed)
 I have spent 5 minutes in review of e-visit questionnaire, review and updating patient chart, medical decision making and response to patient.   Piedad Climes, PA-C

## 2023-04-15 NOTE — Progress Notes (Signed)

## 2023-04-19 ENCOUNTER — Other Ambulatory Visit (HOSPITAL_BASED_OUTPATIENT_CLINIC_OR_DEPARTMENT_OTHER): Payer: Self-pay

## 2023-04-19 MED ORDER — MEDROXYPROGESTERONE ACETATE 10 MG PO TABS
10.0000 mg | ORAL_TABLET | Freq: Every day | ORAL | 6 refills | Status: AC | PRN
  Filled 2023-04-19 – 2023-05-06 (×2): qty 10, 10d supply, fill #0
  Filled 2023-11-03: qty 10, 10d supply, fill #1
  Filled 2024-02-15: qty 10, 10d supply, fill #2
  Filled 2024-04-10: qty 10, 10d supply, fill #3

## 2023-05-03 ENCOUNTER — Other Ambulatory Visit (HOSPITAL_BASED_OUTPATIENT_CLINIC_OR_DEPARTMENT_OTHER): Payer: Self-pay

## 2023-05-06 ENCOUNTER — Other Ambulatory Visit (HOSPITAL_BASED_OUTPATIENT_CLINIC_OR_DEPARTMENT_OTHER): Payer: Self-pay

## 2023-05-11 ENCOUNTER — Other Ambulatory Visit (HOSPITAL_BASED_OUTPATIENT_CLINIC_OR_DEPARTMENT_OTHER): Payer: Self-pay

## 2023-05-11 MED ORDER — CIPROFLOXACIN HCL 0.3 % OP SOLN
1.0000 [drp] | Freq: Four times a day (QID) | OPHTHALMIC | 0 refills | Status: DC
  Filled 2023-05-11: qty 5, 25d supply, fill #0

## 2023-05-11 MED ORDER — KETOROLAC TROMETHAMINE 0.5 % OP SOLN
1.0000 [drp] | Freq: Four times a day (QID) | OPHTHALMIC | 0 refills | Status: DC
Start: 2023-05-11 — End: 2023-09-15
  Filled 2023-05-11: qty 5, 25d supply, fill #0

## 2023-05-11 MED ORDER — PREDNISOLONE ACETATE 1 % OP SUSP
1.0000 [drp] | Freq: Four times a day (QID) | OPHTHALMIC | 2 refills | Status: DC
Start: 2023-05-11 — End: 2023-09-15
  Filled 2023-05-11: qty 5, 25d supply, fill #0

## 2023-05-25 DIAGNOSIS — G4733 Obstructive sleep apnea (adult) (pediatric): Secondary | ICD-10-CM | POA: Diagnosis not present

## 2023-05-31 ENCOUNTER — Other Ambulatory Visit: Payer: Self-pay | Admitting: Hematology and Oncology

## 2023-05-31 ENCOUNTER — Other Ambulatory Visit: Payer: Self-pay

## 2023-05-31 ENCOUNTER — Other Ambulatory Visit (HOSPITAL_BASED_OUTPATIENT_CLINIC_OR_DEPARTMENT_OTHER): Payer: Self-pay

## 2023-05-31 MED ORDER — TAMOXIFEN CITRATE 20 MG PO TABS
20.0000 mg | ORAL_TABLET | Freq: Every day | ORAL | 3 refills | Status: AC
  Filled 2023-05-31: qty 90, 90d supply, fill #0
  Filled 2023-08-17: qty 90, 90d supply, fill #1
  Filled 2023-11-09: qty 90, 90d supply, fill #2
  Filled 2024-01-29: qty 90, 90d supply, fill #3

## 2023-06-17 ENCOUNTER — Other Ambulatory Visit: Payer: Self-pay | Admitting: *Deleted

## 2023-06-17 ENCOUNTER — Inpatient Hospital Stay: Payer: Commercial Managed Care - PPO | Attending: Hematology and Oncology | Admitting: Hematology and Oncology

## 2023-06-17 ENCOUNTER — Inpatient Hospital Stay

## 2023-06-17 VITALS — BP 139/88 | HR 103 | Temp 98.4°F | Resp 16 | Wt 169.6 lb

## 2023-06-17 DIAGNOSIS — Z79810 Long term (current) use of selective estrogen receptor modulators (SERMs): Secondary | ICD-10-CM | POA: Diagnosis not present

## 2023-06-17 DIAGNOSIS — C50211 Malignant neoplasm of upper-inner quadrant of right female breast: Secondary | ICD-10-CM | POA: Insufficient documentation

## 2023-06-17 DIAGNOSIS — R748 Abnormal levels of other serum enzymes: Secondary | ICD-10-CM | POA: Diagnosis not present

## 2023-06-17 DIAGNOSIS — C50911 Malignant neoplasm of unspecified site of right female breast: Secondary | ICD-10-CM | POA: Diagnosis not present

## 2023-06-17 DIAGNOSIS — Z170 Estrogen receptor positive status [ER+]: Secondary | ICD-10-CM | POA: Diagnosis not present

## 2023-06-17 DIAGNOSIS — Z923 Personal history of irradiation: Secondary | ICD-10-CM | POA: Diagnosis not present

## 2023-06-17 DIAGNOSIS — Z79899 Other long term (current) drug therapy: Secondary | ICD-10-CM | POA: Insufficient documentation

## 2023-06-17 DIAGNOSIS — Z1732 Human epidermal growth factor receptor 2 negative status: Secondary | ICD-10-CM | POA: Diagnosis not present

## 2023-06-17 DIAGNOSIS — Z1721 Progesterone receptor positive status: Secondary | ICD-10-CM | POA: Insufficient documentation

## 2023-06-17 LAB — CMP (CANCER CENTER ONLY)
ALT: 29 U/L (ref 0–44)
AST: 51 U/L — ABNORMAL HIGH (ref 15–41)
Albumin: 4 g/dL (ref 3.5–5.0)
Alkaline Phosphatase: 73 U/L (ref 38–126)
Anion gap: 9 (ref 5–15)
BUN: 18 mg/dL (ref 6–20)
CO2: 22 mmol/L (ref 22–32)
Calcium: 9.1 mg/dL (ref 8.9–10.3)
Chloride: 108 mmol/L (ref 98–111)
Creatinine: 0.99 mg/dL (ref 0.44–1.00)
GFR, Estimated: >60 mL/min (ref >60)
Glucose, Bld: 126 mg/dL — ABNORMAL HIGH (ref 70–99)
Potassium: 3.6 mmol/L (ref 3.5–5.1)
Sodium: 139 mmol/L (ref 135–145)
Total Bilirubin: 0.5 mg/dL (ref 0.0–1.2)
Total Protein: 7.2 g/dL (ref 6.5–8.1)

## 2023-06-17 NOTE — Progress Notes (Signed)
 [facility]  [phone] [phone]     ID: [name] DOB: Sep 10, 1965  MR#: [id number]  HYQ#:[id number]  Patient Care Team: Agapito Games, MD as PCP - General Candice Camp, MD (Obstetrics and Gynecology) Pershing Proud, RN as Oncology Nurse Navigator Donnelly Angelica, RN as Oncology Nurse Navigator Almond Lint, MD as Consulting Physician (General Surgery) Magrinat, Valentino Hue, MD (Inactive) as Consulting Physician (Oncology) Dorothy Puffer, MD as Consulting Physician (Radiation Oncology) Rachel Moulds, MD  CHIEF COMPLAINT: Estrogen receptor positive breast cancer  CURRENT TREATMENT: tamoxifen   INTERVAL HISTORY:  [name]" returns today for follow up of her estrogen receptor positive breast cancer. History of Present Illness [name] "[name]" is a 58 year old female with a history of right-sided breast cancer who presents for follow-up.  She has a history of right-sided breast cancer, treated with a right lumpectomy in 2020.  She received radiation therapy and has been on tamoxifen since March 2021, nearing the end of her five-year treatment course. No new side effects from tamoxifen are reported.  Recent blood work on January 9th showed slightly elevated liver enzymes, consistent with previous results from two years ago. She has no bleeding from anywhere and no new symptoms. A liver ultrasound has been recommended.  She had a mammogram in December, which was normal, and a Pap smear in January, which was also normal. She is scheduled for another mammogram in December of this year.  She engages in physical activity, attempting to go to the gym a few times a week and walking on the treadmill, which she started this year.   Rest of the pertinent 10 point ROS reviewed and neg.   COVID 19 VACCINATION STATUS: fully vaccinated [employer]), with booster October 2021; positive at home test on 05/12/2020   HISTORY  OF CURRENT ILLNESS: From the original intake note:  [name] had routine screening mammography on 12/05/2018 showing a possible abnormality in the right breast. She underwent right diagnostic mammography with tomography and right breast ultrasonography at [facility] on 12/08/2018 showing: breast density category C; 1.7 cm irregular mass with associated distortion in the right breast at 1:30; right axilla negative for lymphadenopathy.   Accordingly on 12/09/2018 she proceeded to biopsy of the right breast area in question. The pathology from this procedure ([id number]) showed: invasive ductal carcinoma, grade 2; ductal carcinoma in situ, intermediate grade. Prognostic indicators significant for: estrogen receptor, 90% positive and progesterone receptor, 95% positive, both with strong staining intensity. Proliferation marker Ki67 at 10%. HER2 equivocal by immunohistochemistry (2+), but negative by fluorescent in situ hybridization with a signals ratio 1.32 and number per cell 2.25.  The patient's subsequent history is as detailed below.   PAST MEDICAL HISTORY: Past Medical History:  Diagnosis Date   Cancer (HCC)    Eczema    legs   Family history of brain cancer    Family history of breast cancer    Family history of lung cancer    Family history of ovarian cancer    Hallux valgus 06/2012   left   Hammertoe 06/2012   left second   Heart murmur    since birth, has had no testing, no problems   History of frequent ear infections    Hypertension    under control with med., has been on med. x 3 yr.   Irritable bowel syndrome (IBS)    no current med.   Personal history  of radiation therapy    Sleep apnea    Wears hearing aid    bilateral    PAST SURGICAL HISTORY: Past Surgical History:  Procedure Laterality Date   BREAST BIOPSY Right 12/22/2018   BREAST BIOPSY Left 03/13/2022   MM LT BREAST BX W LOC DEV EA AD LESION IMG BX SPEC STEREO GUIDE 03/13/2022 [facility]    BREAST BIOPSY Left 03/13/2022   MM LT BREAST BX W LOC DEV 1ST LESION IMAGE BX SPEC STEREO GUIDE 03/13/2022 [facility]   BREAST LUMPECTOMY Right 01/19/2019   BREAST LUMPECTOMY WITH RADIOACTIVE SEED AND SENTINEL LYMPH NODE BIOPSY Right 01/19/2019   Procedure: RIGHT BREAST LUMPECTOMY WITH RADIOACTIVE SEED AND SENTINEL LYMPH NODE BIOPSY;  Surgeon: Almond Lint, MD;  Location: [facility] OR;  Service: General;  Laterality: Right;   EXPLORATORY TYMPANOTOMY Left 06/25/2000   with lysis of adhesions   HAMMER TOE SURGERY Left 06/16/2012   Procedure: LEFT SCARF MODIFIED MCBRIDE AND SECOND Metatarsal WEIL;  Surgeon: Toni Arthurs, MD;  Location: [facility];  Service: Orthopedics;  Laterality: Left;   MASTOPEXY Left 03/19/2020   Procedure: MASTOPEXY;  Surgeon: Glenna Fellows, MD;  Location:  SURGERY CENTER;  Service: Plastics;  Laterality: Left;   TONSILLECTOMY     TYMPANOPLASTY Left 06/25/2000   TYMPANOSTOMY TUBE PLACEMENT  10/10/2009    FAMILY HISTORY: Family History  Problem Relation Age of Onset   Alzheimer's disease Mother    Brain cancer Father 80       Glioblastoma multiforma.    Ovarian cancer Paternal Aunt        dx. 40s or 50s   Lung cancer Other        dx. 4s (maternal great-aunt)  Patient's father was 71 years old when he died from glioblastoma, diagnosed age 18.. Patient's mother died at age 75. The patient notes a family hx of breast and ovarian cancer. A paternal aunt was diagnosed with ovarian cancer (age unsure) and a maternal cousin with breast cancer (age unknown).  The patient has 3 brothers, no sisters.    GYNECOLOGIC HISTORY:  No LMP recorded. (Menstrual status: Other). Menarche: 58 years old Age at first live birth: 59 years old GX P 2 LMP 12/14/2018, having regular periods Contraceptive: yes, used for 29 years, stopped 12/2018; currently barrier methods HRT n/a  Hysterectomy? no BSO? 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 Husband Fayrene Fearing is a Best boy. Daughter Lanora Manis is a Production assistant, radio at [employer] here in [place].  She had the patient's son Gerilyn Pilgrim are enrolled at [facility].  The patient attends [employer] in [place].    ADVANCED DIRECTIVES: not in place.  In the absence of any documents to the contrary husband Fayrene Fearing is automatically her HCPOA.   HEALTH MAINTENANCE: Social History   Tobacco Use   Smoking status: Never   Smokeless tobacco: Never  Vaping Use   Vaping status: Never Used  Substance Use Topics   Alcohol use: Not Currently    Comment: occasionally   Drug use: No     Colonoscopy: never done  PAP: 12/01/2018, negative  Bone density: not on file, due for repeat 01/2019   Allergies  Allergen Reactions   Erythromycin Nausea And Vomiting    FACIAL NUMBNESS   Nitrofurantoin Other (See Comments)    GI UPSET   Lexapro [Escitalopram Oxalate] Nausea Only   Lisinopril Cough    Current Outpatient Medications  Medication Sig Dispense Refill   AMBULATORY NON FORMULARY MEDICATION Medication Name: New CPAP machine. Old machine broken. Set  to 17 cm water pressure. Fax to [place] [phone]. 1 Units PRN   benzonatate (TESSALON) 100 MG capsule Take 1 capsule (100 mg total) by mouth 3 (three) times daily as needed for cough. 30 capsule 0   cholecalciferol (VITAMIN D3) 25 MCG (1000 UNIT) tablet Take 1,000 Units by mouth daily.     ciprofloxacin (CILOXAN) 0.3 % ophthalmic solution Place 1 drop into the affected eye 4 (four) times daily, starting 3 days prior to surgical intervention. 5 mL 0   doxycycline (VIBRA-TABS) 100 MG tablet Take 1 tablet (100 mg total) by mouth 2 (two) times daily. 14 tablet 0   FLUoxetine (PROZAC) 10 MG capsule Take 2 capsules (20 mg total) by mouth daily. 180 capsule 1   ketorolac (ACULAR) 0.5 % ophthalmic solution Place 1 drop into the affected eye 4 (four) times daily, starting 3 days prior to  surgical intervention. 5 mL 0   losartan-hydrochlorothiazide (HYZAAR) 100-25 MG tablet Take 1 tablet by mouth daily. 90 tablet 1   medroxyPROGESTERone (PROVERA) 10 MG tablet Take 1 tablet (10 mg total) by mouth daily for 10 days, as needed every 2 months if no menses. 10 tablet 6   prednisoLONE acetate (PRED FORTE) 1 % ophthalmic suspension Place 1 drop into the affected eye 4 (four) times daily, starting 3 days prior to surgical intervention. 5 mL 2   tamoxifen (NOLVADEX) 20 MG tablet Take 1 tablet (20 mg total) by mouth daily. 90 tablet 3   No current facility-administered medications for this visit.    OBJECTIVE:  white [name] who appears stated age  There were no vitals filed for this visit.     There is no height or weight on file to calculate BMI.   Wt Readings from Last 3 Encounters:  03/18/23 169 lb (76.7 kg)  09/15/22 169 lb (76.7 kg)  06/15/22 167 lb 8 oz (76 kg)      ECOG FS:1 - Symptomatic but completely ambulatory  Physical Exam Constitutional:      Appearance: Normal appearance.  Chest:     Comments: Bilateral breast examined.  No palpable masses in bilateral breasts or regional adenopathy.  Musculoskeletal:     Cervical back: Normal range of motion and neck supple. No rigidity.  Lymphadenopathy:     Cervical: No cervical adenopathy.  Neurological:     Mental Status: She is alert.       LAB RESULTS:  CMP     Component Value Date/Time   NA 142 03/18/2023 1034   K 3.7 03/18/2023 1034   CL 106 03/18/2023 1034   CO2 20 03/18/2023 1034   GLUCOSE 117 (H) 03/18/2023 1034   GLUCOSE 105 (H) 09/15/2022 1032   BUN 16 03/18/2023 1034   CREATININE 1.13 (H) 03/18/2023 1034   CREATININE 0.99 09/15/2022 1032   CALCIUM 8.8 03/18/2023 1034   PROT 6.7 03/18/2023 1034   ALBUMIN 4.0 03/18/2023 1034   AST 74 (H) 03/18/2023 1034   AST 61 (H) 06/12/2021 0938   ALT 41 (H) 03/18/2023 1034   ALT 43 06/12/2021 0938   ALKPHOS 89 03/18/2023 1034   BILITOT 0.4 03/18/2023  1034   BILITOT 0.5 06/12/2021 0938   GFRNONAA >60 06/12/2021 0938   GFRNONAA 54 (L) 02/19/2020 1023   GFRAA 62 02/19/2020 1023    No results found for: "TOTALPROTELP", "ALBUMINELP", "A1GS", "A2GS", "BETS", "BETA2SER", "GAMS", "MSPIKE", "SPEI"  No results found for: "KPAFRELGTCHN", "LAMBDASER", "KAPLAMBRATIO"  Lab Results  Component Value Date   WBC 7.5 03/18/2023   NEUTROABS 4.8 03/18/2023   HGB 12.8 03/18/2023   HCT 38.8 03/18/2023   MCV 86 03/18/2023   PLT 210 03/18/2023   No results found for: "LABCA2"  No components found for: "NFAOZH086"  No results for input(s): "INR" in the last 168 hours.  No results found for: "LABCA2"  No results found for: "VHQ469"  No results found for: "CAN125"  No results found for: "CAN153"  No results found for: "CA2729"  No components found for: "HGQUANT"  No results found for: "CEA1", "CEA" / No results found for: "CEA1", "CEA"   No results found for: "AFPTUMOR"  No results found for: "CHROMOGRNA"  No results found for: "HGBA", "HGBA2QUANT", "HGBFQUANT", "HGBSQUAN" (Hemoglobinopathy evaluation)   No results found for: "LDH"  No results found for: "IRON", "TIBC", "IRONPCTSAT" (Iron and TIBC)  No results found for: "FERRITIN"  Urinalysis No results found for: "COLORURINE", "APPEARANCEUR", "LABSPEC", "PHURINE", "GLUCOSEU", "HGBUR", "BILIRUBINUR", "KETONESUR", "PROTEINUR", "UROBILINOGEN", "NITRITE", "LEUKOCYTESUR"   STUDIES: No results found.    ELIGIBLE FOR AVAILABLE RESEARCH PROTOCOL: [place]  ASSESSMENT: 58 y.o. [name], [name] status post right breast upper inner quadrant biopsy 12/09/2018 for a clinical T1c N0, stage IA invasive ductal carcinoma, grade 2, estrogen and progesterone receptor positive, HER-2 nonamplified, with an MIB-1 of 10%  (1) status post right lumpectomy and sentinel lymph node sampling 01/19/2019 for a pT1c pN0, stage IA invasive ductal carcinoma, grade 2, with close but negative margins  and evidence of lymphovascular invasion  (a) a total of 4 right axillary lymph nodes were removed  (2) Oncotype score of 18 predicts a risk of recurrence outside the breast over the next 9 years of 5% if the patient's only systemic therapy is antiestrogens for 5 years.  It also predicts no benefit from chemotherapy.  (3) adjuvant radiation 03/07/19 - 04/21/19: The patient initially received a dose of 50 Gy in 25 fractions to the right breast using whole-breast tangent fields. This was delivered using a 3-D conformal technique. The patient then received a boost to the seroma. This delivered an additional 14.4 Gy in 8 fractions using a 3-field photon boost. The total dose was 64.4 Gy.  (4) tamoxifen started 05/08/2019  (5) genetics testing 04/08/2019 through the Common Hereditary Cancers Panel offered by [facility] found no deleterious mutations in APC, ATM, AXIN2, BARD1, BMPR1A, BRCA1, BRCA2, BRIP1, CDH1, CDK4, CDKN2A (p14ARF), CDKN2A (p16INK4a), CHEK2, CTNNA1, DICER1, EPCAM (Deletion/duplication testing only), GREM1 (promoter region deletion/duplication testing only), KIT, MEN1, MLH1, MSH2, MSH3, MSH6, MUTYH, NBN, NF1, NHTL1, PALB2, PDGFRA, PMS2, POLD1, POLE, PTEN, RAD50, RAD51C, RAD51D, RNF43, SDHB, SDHC, SDHD, SMAD4, SMARCA4. STK11, TP53, TSC1, TSC2, and VHL.  The following genes were evaluated for sequence changes only: SDHA and HOXB13 c.251G>A variant only.    PLAN:  Assessment and Plan Assessment & Plan Right-sided breast cancer Right-sided breast cancer treated with lumpectomy and radiation. Tumor grade 2, small, with four lymph nodes negative for metastasis. Oncotype score 18. On tamoxifen since March 2021, concluding treatment next year. No new side effects. No concerns on PE. Five years of tamoxifen deemed sufficient. - Continue tamoxifen until March 2026. - Order mammogram for December 2025. - Draw labs today to check liver enzymes. - Schedule follow-up in one year.  Elevated liver  enzymes Liver enzymes slightly elevated, consistent with levels from two years ago. Tamoxifen can affect liver function, necessitating monitoring. - CMP today, improved.  Continue monitoring annually.  Total encounter time 20 minutes.Rachel Moulds, MD   06/17/2023 1:21 PM Medical Oncology and Hematology [facility] [address] [place], [place] [address] Tel. [phone]    Fax. [phone]   *Total Encounter Time as defined by the Centers for Medicare and Medicaid Services includes, in addition to the face-to-face time of a patient visit (documented in the note above) non-face-to-face time: obtaining and reviewing outside history, ordering and reviewing medications, tests or procedures, care coordination (communications with other health care professionals or caregivers) and documentation in the medical record.

## 2023-06-18 ENCOUNTER — Telehealth: Payer: Self-pay | Admitting: Hematology and Oncology

## 2023-06-18 NOTE — Telephone Encounter (Signed)
 Confirmed with pt about scheduled appt date and time

## 2023-06-29 DIAGNOSIS — H35371 Puckering of macula, right eye: Secondary | ICD-10-CM | POA: Diagnosis not present

## 2023-06-29 DIAGNOSIS — H2513 Age-related nuclear cataract, bilateral: Secondary | ICD-10-CM | POA: Diagnosis not present

## 2023-07-13 ENCOUNTER — Other Ambulatory Visit (HOSPITAL_BASED_OUTPATIENT_CLINIC_OR_DEPARTMENT_OTHER): Payer: Self-pay

## 2023-07-13 MED ORDER — OFLOXACIN 0.3 % OP SOLN
1.0000 [drp] | Freq: Four times a day (QID) | OPHTHALMIC | 1 refills | Status: DC
  Filled 2023-07-13: qty 5, 7d supply, fill #0

## 2023-07-13 MED ORDER — PREDNISOLONE ACETATE 1 % OP SUSP
OPHTHALMIC | 0 refills | Status: AC
  Filled 2023-07-13: qty 5, 28d supply, fill #0

## 2023-07-16 DIAGNOSIS — G4733 Obstructive sleep apnea (adult) (pediatric): Secondary | ICD-10-CM | POA: Diagnosis not present

## 2023-07-16 DIAGNOSIS — H25811 Combined forms of age-related cataract, right eye: Secondary | ICD-10-CM | POA: Diagnosis not present

## 2023-07-16 DIAGNOSIS — I10 Essential (primary) hypertension: Secondary | ICD-10-CM | POA: Diagnosis not present

## 2023-07-16 DIAGNOSIS — H2511 Age-related nuclear cataract, right eye: Secondary | ICD-10-CM | POA: Diagnosis not present

## 2023-07-28 ENCOUNTER — Other Ambulatory Visit (HOSPITAL_BASED_OUTPATIENT_CLINIC_OR_DEPARTMENT_OTHER): Payer: Self-pay

## 2023-07-28 MED ORDER — PREDNISOLONE ACETATE 1 % OP SUSP
OPHTHALMIC | 0 refills | Status: AC
Start: 2023-07-27 — End: 2023-08-25
  Filled 2023-07-28: qty 5, 28d supply, fill #0

## 2023-07-28 MED ORDER — OFLOXACIN 0.3 % OP SOLN
1.0000 [drp] | Freq: Four times a day (QID) | OPHTHALMIC | 1 refills | Status: DC
  Filled 2023-07-28: qty 5, 25d supply, fill #0

## 2023-07-30 DIAGNOSIS — H25812 Combined forms of age-related cataract, left eye: Secondary | ICD-10-CM | POA: Diagnosis not present

## 2023-07-30 DIAGNOSIS — F419 Anxiety disorder, unspecified: Secondary | ICD-10-CM | POA: Diagnosis not present

## 2023-07-30 DIAGNOSIS — H2512 Age-related nuclear cataract, left eye: Secondary | ICD-10-CM | POA: Diagnosis not present

## 2023-08-10 ENCOUNTER — Other Ambulatory Visit (HOSPITAL_BASED_OUTPATIENT_CLINIC_OR_DEPARTMENT_OTHER): Payer: Self-pay

## 2023-08-10 MED ORDER — BROMFENAC SODIUM (ONCE-DAILY) 0.09 % OP SOLN
1.0000 [drp] | Freq: Every day | OPHTHALMIC | 0 refills | Status: DC
  Filled 2023-08-10: qty 1.7, 28d supply, fill #0

## 2023-08-11 ENCOUNTER — Other Ambulatory Visit (HOSPITAL_BASED_OUTPATIENT_CLINIC_OR_DEPARTMENT_OTHER): Payer: Self-pay

## 2023-09-13 DIAGNOSIS — H35371 Puckering of macula, right eye: Secondary | ICD-10-CM | POA: Diagnosis not present

## 2023-09-15 ENCOUNTER — Other Ambulatory Visit (HOSPITAL_BASED_OUTPATIENT_CLINIC_OR_DEPARTMENT_OTHER): Payer: Self-pay

## 2023-09-15 ENCOUNTER — Ambulatory Visit: Payer: Commercial Managed Care - PPO | Admitting: Family Medicine

## 2023-09-15 ENCOUNTER — Encounter: Payer: Self-pay | Admitting: Family Medicine

## 2023-09-15 VITALS — BP 122/79 | HR 88 | Ht 61.0 in | Wt 169.0 lb

## 2023-09-15 DIAGNOSIS — I10 Essential (primary) hypertension: Secondary | ICD-10-CM

## 2023-09-15 DIAGNOSIS — R748 Abnormal levels of other serum enzymes: Secondary | ICD-10-CM | POA: Diagnosis not present

## 2023-09-15 MED ORDER — FLUOXETINE HCL 10 MG PO CAPS
20.0000 mg | ORAL_CAPSULE | Freq: Every day | ORAL | 1 refills | Status: DC
  Filled 2023-09-15: qty 180, 90d supply, fill #0

## 2023-09-15 MED ORDER — FLUOXETINE HCL 10 MG PO CAPS
20.0000 mg | ORAL_CAPSULE | Freq: Every day | ORAL | 1 refills | Status: DC
  Filled 2023-09-15: qty 180, 90d supply, fill #0
  Filled 2023-12-14: qty 180, 90d supply, fill #1

## 2023-09-15 NOTE — Assessment & Plan Note (Signed)
 I was happy to see that the enzymes seem to be improving.  Continue to work on healthy diet Mediterranean diet would be wonderful as well is incorporating regular exercise but she is otherwise doing well we will continue to monitor carefully.

## 2023-09-15 NOTE — Progress Notes (Signed)
   Established Patient Office Visit  Subjective  Patient ID: [name], female    DOB: 12-04-1965  Age: 58 y.o. MRN: [id number]  Chief Complaint  Patient presents with   Medical Management of Chronic Issues    HPI  Here for 76-month follow-up for hypertension.  She is doing well on her current regimen no recent chest pain or shortness of breath.  She did recently have a CMP drawn at the cancer center in April.  Her liver enzymes had been previously elevated but her ALT actually did go back down to normal and her AST trended down by about 20 points.  But not quite back into the normal range so she wanted know if she still needed to get an ultrasound.  She has been trying to eat more healthy.  She still has 1 more year on her Arimidex.  She recently just had bilateral cataract surgery.  She did really well with that but has followed up with a retinal specialist.    ROS    Objective:     BP 122/79   Pulse 88   Ht 5' 1 (1.549 m)   Wt 169 lb (76.7 kg)   SpO2 99%   BMI 31.93 kg/m    Physical Exam Vitals and nursing note reviewed.  Constitutional:      Appearance: Normal appearance.  HENT:     Head: Normocephalic and atraumatic.  Eyes:     Conjunctiva/sclera: Conjunctivae normal.  Cardiovascular:     Rate and Rhythm: Normal rate and regular rhythm.  Pulmonary:     Effort: Pulmonary effort is normal.     Breath sounds: Normal breath sounds.  Skin:    General: Skin is warm and dry.  Neurological:     Mental Status: She is alert.  Psychiatric:        Mood and Affect: Mood normal.      No results found for any visits on 09/15/23.    The 10-year ASCVD risk score (Arnett DK, et al., 2019) is: 3.5%    Assessment & Plan:   Problem List Items Addressed This Visit       Cardiovascular and Mediastinum   Essential hypertension - Primary   Blood pressure looks fantastic today.        Other   Elevated liver enzymes   I was happy to see that the  enzymes seem to be improving.  Continue to work on healthy diet Mediterranean diet would be wonderful as well is incorporating regular exercise but she is otherwise doing well we will continue to monitor carefully.       Return in about 6 months (around 03/17/2024) for Hypertension.    [name], MD

## 2023-09-15 NOTE — Assessment & Plan Note (Signed)
Blood pressure looks fantastic today.

## 2023-11-03 ENCOUNTER — Other Ambulatory Visit: Payer: Self-pay

## 2023-11-03 ENCOUNTER — Other Ambulatory Visit: Payer: Self-pay | Admitting: Family Medicine

## 2023-11-03 DIAGNOSIS — I10 Essential (primary) hypertension: Secondary | ICD-10-CM

## 2023-11-05 ENCOUNTER — Other Ambulatory Visit (HOSPITAL_BASED_OUTPATIENT_CLINIC_OR_DEPARTMENT_OTHER): Payer: Self-pay

## 2023-11-05 MED ORDER — LOSARTAN POTASSIUM-HCTZ 100-25 MG PO TABS
1.0000 | ORAL_TABLET | Freq: Every day | ORAL | 1 refills | Status: DC
  Filled 2023-11-05: qty 90, 90d supply, fill #0
  Filled 2024-01-29: qty 90, 90d supply, fill #1

## 2023-11-26 ENCOUNTER — Telehealth: Admitting: Emergency Medicine

## 2023-11-26 ENCOUNTER — Other Ambulatory Visit (HOSPITAL_BASED_OUTPATIENT_CLINIC_OR_DEPARTMENT_OTHER): Payer: Self-pay

## 2023-11-26 DIAGNOSIS — J329 Chronic sinusitis, unspecified: Secondary | ICD-10-CM

## 2023-11-26 MED ORDER — AMOXICILLIN-POT CLAVULANATE 875-125 MG PO TABS
1.0000 | ORAL_TABLET | Freq: Two times a day (BID) | ORAL | 0 refills | Status: DC
Start: 2023-11-26 — End: 2024-01-13
  Filled 2023-11-26: qty 14, 7d supply, fill #0

## 2023-11-26 NOTE — Progress Notes (Signed)
E-Visit for Sinus Problems  We are sorry that you are not feeling well.  Here is how we plan to help!  Based on what you have shared with me it looks like you have sinusitis.  Sinusitis is inflammation and infection in the sinus cavities of the head.  Based on your presentation I believe you most likely have Acute Bacterial Sinusitis.  This is an infection caused by bacteria and is treated with antibiotics. I have prescribed Augmentin 875mg/125mg one tablet twice daily with food, for 7 days. You may use an oral decongestant such as Mucinex D or if you have glaucoma or high blood pressure use plain Mucinex. Saline nasal spray help and can safely be used as often as needed for congestion.  If you develop worsening sinus pain, fever or notice severe headache and vision changes, or if symptoms are not better after completion of antibiotic, please schedule an appointment with a health care provider.    Sinus infections are not as easily transmitted as other respiratory infection, however we still recommend that you avoid close contact with loved ones, especially the very young and elderly.  Remember to wash your hands thoroughly throughout the day as this is the number one way to prevent the spread of infection!  Home Care: Only take medications as instructed by your medical team. Complete the entire course of an antibiotic. Do not take these medications with alcohol. A steam or ultrasonic humidifier can help congestion.  You can place a towel over your head and breathe in the steam from hot water coming from a faucet. Avoid close contacts especially the very young and the elderly. Cover your mouth when you cough or sneeze. Always remember to wash your hands.  Get Help Right Away If: You develop worsening fever or sinus pain. You develop a severe head ache or visual changes. Your symptoms persist after you have completed your treatment plan.  Make sure you Understand these instructions. Will watch  your condition. Will get help right away if you are not doing well or get worse.  Thank you for choosing an e-visit.  Your e-visit answers were reviewed by a board certified advanced clinical practitioner to complete your personal care plan. Depending upon the condition, your plan could have included both over the counter or prescription medications.  Please review your pharmacy choice. Make sure the pharmacy is open so you can pick up prescription now. If there is a problem, you may contact your provider through MyChart messaging and have the prescription routed to another pharmacy.  Your safety is important to us. If you have drug allergies check your prescription carefully.   For the next 24 hours you can use MyChart to ask questions about today's visit, request a non-urgent call back, or ask for a work or school excuse. You will get an email in the next two days asking about your experience. I hope that your e-visit has been valuable and will speed your recovery.  Approximately 5 minutes was used in reviewing the patient's chart, questionnaire, prescribing medications, and documentation.  

## 2024-01-13 ENCOUNTER — Other Ambulatory Visit (HOSPITAL_BASED_OUTPATIENT_CLINIC_OR_DEPARTMENT_OTHER): Payer: Self-pay

## 2024-01-13 ENCOUNTER — Telehealth: Admitting: Physician Assistant

## 2024-01-13 DIAGNOSIS — J069 Acute upper respiratory infection, unspecified: Secondary | ICD-10-CM | POA: Diagnosis not present

## 2024-01-13 MED ORDER — FLUTICASONE PROPIONATE 50 MCG/ACT NA SUSP
2.0000 | Freq: Every day | NASAL | 0 refills | Status: AC
  Filled 2024-01-13: qty 16, 30d supply, fill #0

## 2024-01-13 MED ORDER — PROMETHAZINE-DM 6.25-15 MG/5ML PO SYRP
5.0000 mL | ORAL_SOLUTION | Freq: Four times a day (QID) | ORAL | 0 refills | Status: DC | PRN
  Filled 2024-01-13: qty 118, 6d supply, fill #0

## 2024-01-13 NOTE — Progress Notes (Signed)

## 2024-01-24 ENCOUNTER — Other Ambulatory Visit (HOSPITAL_BASED_OUTPATIENT_CLINIC_OR_DEPARTMENT_OTHER): Payer: Self-pay

## 2024-02-22 ENCOUNTER — Telehealth: Payer: Self-pay | Admitting: Hematology and Oncology

## 2024-02-22 NOTE — Telephone Encounter (Signed)
 I spoke with patient regarding 06/19/24 appt being rescheduled to 06/20/24. Patient is aware of new appt date and time.

## 2024-02-28 ENCOUNTER — Telehealth: Admitting: Physician Assistant

## 2024-02-28 DIAGNOSIS — R6889 Other general symptoms and signs: Secondary | ICD-10-CM | POA: Diagnosis not present

## 2024-02-28 DIAGNOSIS — Z20828 Contact with and (suspected) exposure to other viral communicable diseases: Secondary | ICD-10-CM | POA: Diagnosis not present

## 2024-02-28 MED ORDER — OSELTAMIVIR PHOSPHATE 75 MG PO CAPS: 75.0000 mg | ORAL_CAPSULE | Freq: Two times a day (BID) | ORAL | 0 refills | Status: AC

## 2024-02-28 MED ORDER — BENZONATATE 100 MG PO CAPS: 100.0000 mg | ORAL_CAPSULE | Freq: Three times a day (TID) | ORAL | 0 refills | Status: DC | PRN

## 2024-02-28 NOTE — Progress Notes (Signed)
 E visit for Flu like symptoms   We are sorry that you are not feeling well.  Here is how we plan to help! Based on what you have shared with me it looks like you may have a respiratory virus that may be influenza.  Influenza or "the flu" is  an infection caused by a respiratory virus. The flu virus is highly contagious and persons who did not receive their yearly flu vaccination may "catch" the flu from close contact.  We have anti-viral medications to treat the viruses that cause this infection. They are not a "cure" and only shorten the course of the infection. These prescriptions are most effective when they are given within the first 2 days of "flu" symptoms. Antiviral medications are indicated if you have a high risk of complications from the flu. You should  also consider an antiviral medication if you are in close contact with someone who is at risk. These medications can help patients avoid complications from the flu but have side effects that you should know.   Possible side effects from Tamiflu or oseltamivir include nausea, vomiting, diarrhea, dizziness, headaches, eye redness, sleep problems or other respiratory symptoms. You should not take Tamiflu if you have an allergy to oseltamivir or any to the ingredients in Tamiflu.  Based upon your symptoms and potential risk factors I have prescribed Oseltamivir (Tamiflu).  It has been sent to your designated pharmacy.  You will take one 75 mg capsule orally twice a day for the next 5 days.   For nasal congestion, you may use an oral decongestant such as Mucinex  D or if you have glaucoma or high blood pressure use plain Mucinex .  Saline nasal spray or nasal drops can help and can safely be used as often as needed for congestion.  If you have a sore or scratchy throat, use a saltwater gargle-  to  teaspoon of salt dissolved in a 4-ounce to 8-ounce glass of warm water.  Gargle the solution for approximately 15-30 seconds and then spit.  It is  important not to swallow the solution.  You can also use throat lozenges/cough drops and Chloraseptic spray to help with throat pain or discomfort.  Warm or cold liquids can also be helpful in relieving throat pain.  For headache, pain or general discomfort, you can use Ibuprofen  or Tylenol  as directed.   Some authorities believe that zinc sprays or the use of Echinacea may shorten the course of your symptoms.  I have prescribed the following medications to help lessen symptoms: I have prescribed Tessalon  Perles 100 mg. You may take 1-2 capsules every 8 hours as needed for cough  You are to isolate at home until you have been fever-free for at least 24 hours without a fever-reducing medication, and symptoms have been steadily improving for 24 hours.  If you must be around other household members who do not have symptoms, you need to make sure that both you and the family members are masking consistently with a high-quality mask.  If you note any worsening of symptoms despite treatment, please seek an in-person evaluation ASAP. If you note any significant shortness of breath or any chest pain, please seek ED evaluation. Please do not delay care!  ANYONE WHO HAS FLU SYMPTOMS SHOULD: Stay home. The flu is highly contagious and going out or to work exposes others! Be sure to drink plenty of fluids. Water is fine as well as fruit juices, sodas and electrolyte beverages. You may want to stay  away from caffeine or alcohol. If you are nauseated, try taking small sips of liquids. How do you know if you are getting enough fluid? Your urine should be a pale yellow or almost colorless. Get rest. Taking a steamy shower or using a humidifier may help nasal congestion and ease sore throat pain. Using a saline nasal spray works much the same way. Cough drops, hard candies and sore throat lozenges may ease your cough. Line up a caregiver. Have someone check on you regularly.  GET HELP RIGHT AWAY IF: You cannot  keep down liquids or your medications. You become short of breath Your fell like you are going to pass out or loose consciousness. Your symptoms persist after you have completed your treatment plan  MAKE SURE YOU  Understand these instructions. Will watch your condition. Will get help right away if you are not doing well or get worse.  Your e-visit answers were reviewed by a board certified advanced clinical practitioner to complete your personal care plan.  Depending on the condition, your plan could have included both over the counter or prescription medications.  If there is a problem please reply  once you have received a response from your provider.  Your safety is important to us .  If you have drug allergies check your prescription carefully.    You can use MyChart to ask questions about today's visit, request a non-urgent call back, or ask for a work or school excuse for 24 hours related to this e-Visit. If it has been greater than 24 hours you will need to follow up with your provider, or enter a new e-Visit to address those concerns.  You will get an e-mail in the next two days asking about your experience.  I hope that your e-visit has been valuable and will speed your recovery. Thank you for using e-visits.   I have spent 5 minutes in review of e-visit questionnaire, review and updating patient chart, medical decision making and response to patient.   Elsie Velma Lunger, PA-C

## 2024-03-07 ENCOUNTER — Telehealth: Admitting: Physician Assistant

## 2024-03-07 DIAGNOSIS — H1030 Unspecified acute conjunctivitis, unspecified eye: Secondary | ICD-10-CM | POA: Diagnosis not present

## 2024-03-07 MED ORDER — POLYMYXIN B-TRIMETHOPRIM 10000-0.1 UNIT/ML-% OP SOLN: OPHTHALMIC | 0 refills | Status: DC

## 2024-03-07 NOTE — Progress Notes (Signed)
 We are sorry that you are not feeling well.  Here is how we plan to help!  Based on what you have shared with me it looks like you have conjunctivitis.  Conjunctivitis is a common inflammatory or infectious condition of the eye that is often referred to as pink eye.  In most cases it is contagious (viral or bacterial). However, not all conjunctivitis requires antibiotics (ex. Allergic).  We have made appropriate suggestions for you based upon your presentation.  I have prescribed Polytrim  Ophthalmic drops 1-2 drops 4 times a day times 5 days  Pink eye can be highly contagious.  It is typically spread through direct contact with secretions, or contaminated objects or surfaces that one may have touched.  Strict handwashing is suggested with soap and water is urged.  If not available, use alcohol based had sanitizer.  Avoid unnecessary touching of the eye.  If you wear contact lenses, you will need to refrain from wearing them until you see no white discharge from the eye for at least 24 hours after being on medication.  You should see symptom improvement in 1-2 days after starting the medication regimen.  Call us  if symptoms are not improved in 1-2 days.  Home Care: Wash your hands often! Do not wear your contacts until you complete your treatment plan. Avoid sharing towels, bed linen, personal items with a person who has pink eye. See attention for anyone in your home with similar symptoms.  Get Help Right Away If: Your symptoms do not improve. You develop blurred or loss of vision. Your symptoms worsen (increased discharge, pain or redness)  Your e-visit answers were reviewed by a board certified advanced clinical practitioner to complete your personal care plan.  Depending on the condition, your plan could have included both over the counter or prescription medications.  If there is a problem please reply  once you have received a response from your provider.  Your safety is important to  us .  If you have drug allergies check your prescription carefully.    You can use MyChart to ask questions about today's visit, request a non-urgent call back, or ask for a work or school excuse for 24 hours related to this e-Visit. If it has been greater than 24 hours you will need to follow up with your provider, or enter a new e-Visit to address those concerns.   You will get an e-mail in the next two days asking about your experience.  I hope that your e-visit has been valuable and will speed your recovery. Thank you for using e-visits.  I have spent 5 minutes in review of e-visit questionnaire, review and updating patient chart, medical decision making and response to patient.   Elsie Velma Lunger, PA-C

## 2024-03-08 ENCOUNTER — Ambulatory Visit

## 2024-03-11 ENCOUNTER — Other Ambulatory Visit: Payer: Self-pay | Admitting: Family Medicine

## 2024-03-13 ENCOUNTER — Other Ambulatory Visit (HOSPITAL_BASED_OUTPATIENT_CLINIC_OR_DEPARTMENT_OTHER): Payer: Self-pay

## 2024-03-13 MED ORDER — FLUOXETINE HCL 10 MG PO CAPS
20.0000 mg | ORAL_CAPSULE | Freq: Every day | ORAL | 1 refills | Status: AC
  Filled 2024-03-13: qty 180, 90d supply, fill #0

## 2024-03-14 ENCOUNTER — Inpatient Hospital Stay: Admission: RE | Admit: 2024-03-14 | Discharge: 2024-03-14 | Attending: Hematology and Oncology

## 2024-03-14 DIAGNOSIS — Z170 Estrogen receptor positive status [ER+]: Secondary | ICD-10-CM

## 2024-03-16 ENCOUNTER — Other Ambulatory Visit (HOSPITAL_BASED_OUTPATIENT_CLINIC_OR_DEPARTMENT_OTHER): Payer: Self-pay

## 2024-03-16 ENCOUNTER — Ambulatory Visit: Admitting: Family Medicine

## 2024-03-16 VITALS — BP 134/62 | HR 94 | Ht 61.0 in | Wt 165.0 lb

## 2024-03-16 DIAGNOSIS — C50211 Malignant neoplasm of upper-inner quadrant of right female breast: Secondary | ICD-10-CM | POA: Diagnosis not present

## 2024-03-16 DIAGNOSIS — H938X1 Other specified disorders of right ear: Secondary | ICD-10-CM

## 2024-03-16 DIAGNOSIS — I10 Essential (primary) hypertension: Secondary | ICD-10-CM

## 2024-03-16 DIAGNOSIS — Z170 Estrogen receptor positive status [ER+]: Secondary | ICD-10-CM

## 2024-03-16 DIAGNOSIS — F3341 Major depressive disorder, recurrent, in partial remission: Secondary | ICD-10-CM | POA: Diagnosis not present

## 2024-03-16 MED ORDER — LOSARTAN POTASSIUM-HCTZ 100-25 MG PO TABS
1.0000 | ORAL_TABLET | Freq: Every day | ORAL | 1 refills | Status: AC
  Filled 2024-03-16: qty 90, 90d supply, fill #0

## 2024-03-16 NOTE — Assessment & Plan Note (Signed)
 Should be coming off tamoxifen  this year.

## 2024-03-16 NOTE — Progress Notes (Signed)
 "  Established Patient Office Visit  Patient ID: [name], female    DOB: 1965/10/25  Age: 59 y.o. MRN: [id number] PCP: Alvan Dorothyann BIRCH, MD  Chief Complaint  Patient presents with   Hypertension    Subjective:     HPI  Discussed the use of AI scribe software for clinical note transcription with the patient, who gave verbal consent to proceed.  History of Present Illness [name] is a 59 year old female who presents with ear fullness.  Right ear fullness - Fullness in right ear since recovering from influenza A contracted around Christmas - Describes ear as 'stopped up' - No ear pain - History of ear tubes, with right tube having fallen out - History of eardrum replacement, possibly on right side - Uses Flonase  regularly  Recent influenza a infection - Contracted influenza A around Christmas after caring for sick grandson - Received flu vaccine in October 2025  Breast cancer surveillance - Currently taking tamoxifen , scheduled to complete in March 2026 - Diagnosed with breast cancer in October 2020 - Awaiting mammogram results, expected in about five days  Hypertension management - Blood pressure managed with losartan  HCT - No chest pain or unusual symptoms  Mood management - Takes fluoxetine  10 mg daily - Mood described as 'pretty good' - No desire to change current therapy - PHQ-9 and GAD-7 scores are both zero     ROS    Objective:     BP 134/62   Pulse 94   Ht 5' 1 (1.549 m)   Wt 165 lb (74.8 kg)   SpO2 98%   BMI 31.18 kg/m    Physical Exam Vitals and nursing note reviewed.  Constitutional:      Appearance: Normal appearance.  HENT:     Head: Normocephalic and atraumatic.  Eyes:     Conjunctiva/sclera: Conjunctivae normal.  Cardiovascular:     Rate and Rhythm: Normal rate and regular rhythm.  Pulmonary:     Effort: Pulmonary effort is normal.     Breath sounds: Normal breath sounds.  Skin:     General: Skin is warm and dry.  Neurological:     Mental Status: She is alert.  Psychiatric:        Mood and Affect: Mood normal.      No results found for any visits on 03/16/24.    The 10-year ASCVD risk score (Arnett DK, et al., 2019) is: 4.6%    Assessment & Plan:   Problem List Items Addressed This Visit       Cardiovascular and Mediastinum   Essential hypertension - Primary   Relevant Medications   losartan -hydrochlorothiazide  (HYZAAR ) 100-25 MG tablet   Other Relevant Orders   CMP14+EGFR   Lipid panel   CBC     Other   MDD (major depressive disorder), recurrent, in partial remission   Malignant neoplasm of upper-inner quadrant of right breast in female, estrogen receptor positive (HCC)   Should be coming off tamoxifen  this year.        Relevant Orders   CMP14+EGFR   Lipid panel   CBC   Other Visit Diagnoses       Ear fullness, right           Assessment and Plan Assessment & Plan Malignant neoplasm of upper-inner quadrant of right breast, estrogen receptor positive Currently on tamoxifen , awaiting mammogram results. - Continue tamoxifen  until April. - Await mammogram results.  Essential hypertension Blood pressure well-controlled on losartan  HCT. -  Continue losartan  HCT.  Major depressive disorder, recurrent, in partial remission Mood stable with current therapy. PHQ-9 and GAD-7 scores are zero. - Continue fluoxetine  10 mg daily.  Ear fullness, right Possible fluid buildup in Eustachian tube post-flu. Right eardrum thick and dull, possibly due to previous eardrum replacement. - Use Flonase  or Nasonex for a couple of weeks to open sinuses and allow Eustachian tube drainage. - If no improvement, follow up with ENT.  General health maintenance Received flu shot in October. Hepatitis B vaccination status unclear. - Consider updating Hepatitis B vaccination status.    Return in about 6 months (around 09/13/2024) for Hypertension.     [name], MD [facility] at [facility]   "

## 2024-03-17 ENCOUNTER — Ambulatory Visit: Payer: Self-pay | Admitting: Family Medicine

## 2024-03-17 DIAGNOSIS — R7401 Elevation of levels of liver transaminase levels: Secondary | ICD-10-CM

## 2024-03-17 DIAGNOSIS — E876 Hypokalemia: Secondary | ICD-10-CM

## 2024-03-17 LAB — CBC
Hematocrit: 37.1 % (ref 34.0–46.6)
Hemoglobin: 12.1 g/dL (ref 11.1–15.9)
MCH: 27.7 pg (ref 26.6–33.0)
MCHC: 32.6 g/dL (ref 31.5–35.7)
MCV: 85 fL (ref 79–97)
Platelets: 211 x10E3/uL (ref 150–450)
RBC: 4.37 x10E6/uL (ref 3.77–5.28)
RDW: 13.3 % (ref 11.7–15.4)
WBC: 7.4 x10E3/uL (ref 3.4–10.8)

## 2024-03-17 LAB — CMP14+EGFR
ALT: 28 IU/L (ref 0–32)
AST: 50 IU/L — ABNORMAL HIGH (ref 0–40)
Albumin: 4 g/dL (ref 3.8–4.9)
Alkaline Phosphatase: 82 IU/L (ref 49–135)
BUN/Creatinine Ratio: 17 (ref 9–23)
BUN: 18 mg/dL (ref 6–24)
Bilirubin Total: 0.4 mg/dL (ref 0.0–1.2)
CO2: 19 mmol/L — ABNORMAL LOW (ref 20–29)
Calcium: 9.2 mg/dL (ref 8.7–10.2)
Chloride: 104 mmol/L (ref 96–106)
Creatinine, Ser: 1.09 mg/dL — ABNORMAL HIGH (ref 0.57–1.00)
Globulin, Total: 3 g/dL (ref 1.5–4.5)
Glucose: 120 mg/dL — ABNORMAL HIGH (ref 70–99)
Potassium: 3.3 mmol/L — ABNORMAL LOW (ref 3.5–5.2)
Sodium: 141 mmol/L (ref 134–144)
Total Protein: 7 g/dL (ref 6.0–8.5)
eGFR: 59 mL/min/1.73 — ABNORMAL LOW

## 2024-03-17 LAB — LIPID PANEL
Chol/HDL Ratio: 3.8 ratio (ref 0.0–4.4)
Cholesterol, Total: 170 mg/dL (ref 100–199)
HDL: 45 mg/dL
LDL Chol Calc (NIH): 104 mg/dL — ABNORMAL HIGH (ref 0–99)
Triglycerides: 119 mg/dL (ref 0–149)
VLDL Cholesterol Cal: 21 mg/dL (ref 5–40)

## 2024-03-17 NOTE — Progress Notes (Signed)
 Hi Kim, kidney function is stable at 1 point.  Potassium was just a little borderline low.  I am not quite sure why normally it looks great so I do want a recheck this in about 2 to 3 weeks.  Your AST liver enzyme is still just slightly elevated similar to 9 months ago but the ALT still looks normal.  Will still continue to keep an eye on this.  Had ordered an ultrasound of the liver back in the spring but not sure what happened.  I had still like to get that done and updated this year if at all possible.  Just let us  know if you are okay with that we do them downstairs in our building or if there is some location closer for you then please let us  know  LDL cholesterol just borderline but otherwise cholesterol levels look good.  Continue to work on healthy Mediterranean diet and regular exercise.  Blood count looks great.

## 2024-03-17 NOTE — Progress Notes (Signed)
 Orders Placed This Encounter  Procedures   US  ABDOMEN COMPLETE W/ELASTOGRAPHY    Standing Status:   Future    Expiration Date:   03/17/2025    Reason for Exam (SYMPTOM  OR DIAGNOSIS REQUIRED):   elevated liver enzymes    Preferred imaging location?:   [facility]

## 2024-03-17 NOTE — Addendum Note (Signed)
 Addended by: Abdulmalik Darco D on: 03/17/2024 10:48 AM   Modules accepted: Orders

## 2024-03-22 ENCOUNTER — Other Ambulatory Visit

## 2024-03-22 DIAGNOSIS — R7401 Elevation of levels of liver transaminase levels: Secondary | ICD-10-CM

## 2024-03-24 ENCOUNTER — Ambulatory Visit: Payer: Self-pay | Admitting: Family Medicine

## 2024-03-24 NOTE — Progress Notes (Signed)
 Hi [name], there is some increased fat content in the liver we call that fatty liver.  The treatment is to continue to work on really healthy clean diet and regular exercise shooting for 30 minutes 5 days a week and keeping a healthy BMI.  So we will keep an eye on this.  They did note a gallstone in your gallbladder it is not currently causing a problem but just to let you know it is there.  You do have a small simple cyst in the left kidney.  It is not concerning but just again wanted to let you know.  Some people can have some cysts on the kidneys and it can still be completely normal.

## 2024-06-19 ENCOUNTER — Ambulatory Visit: Admitting: Hematology and Oncology

## 2024-06-19 ENCOUNTER — Other Ambulatory Visit

## 2024-06-20 ENCOUNTER — Inpatient Hospital Stay: Admitting: Hematology and Oncology

## 2024-06-20 ENCOUNTER — Inpatient Hospital Stay

## 2024-09-14 ENCOUNTER — Ambulatory Visit: Admitting: Family Medicine
